# Patient Record
Sex: Male | Born: 1985 | Race: Black or African American | Hispanic: No | Marital: Single | Smoking: Current every day smoker
Health system: Southern US, Community
[De-identification: ages and names within clinical notes are randomized; demographics above are authoritative.]

## PROBLEM LIST (undated history)

## (undated) DIAGNOSIS — T1491XA Suicide attempt, initial encounter: Secondary | ICD-10-CM

## (undated) DIAGNOSIS — J302 Other seasonal allergic rhinitis: Secondary | ICD-10-CM

## (undated) DIAGNOSIS — M1990 Unspecified osteoarthritis, unspecified site: Secondary | ICD-10-CM

## (undated) DIAGNOSIS — F319 Bipolar disorder, unspecified: Secondary | ICD-10-CM

## (undated) DIAGNOSIS — Z765 Malingerer [conscious simulation]: Secondary | ICD-10-CM

## (undated) DIAGNOSIS — Z21 Asymptomatic human immunodeficiency virus [HIV] infection status: Secondary | ICD-10-CM

## (undated) DIAGNOSIS — F209 Schizophrenia, unspecified: Secondary | ICD-10-CM

## (undated) DIAGNOSIS — J45909 Unspecified asthma, uncomplicated: Secondary | ICD-10-CM

## (undated) DIAGNOSIS — F909 Attention-deficit hyperactivity disorder, unspecified type: Secondary | ICD-10-CM

## (undated) DIAGNOSIS — B20 Human immunodeficiency virus [HIV] disease: Secondary | ICD-10-CM

## (undated) DIAGNOSIS — J40 Bronchitis, not specified as acute or chronic: Secondary | ICD-10-CM

## (undated) DIAGNOSIS — F1910 Other psychoactive substance abuse, uncomplicated: Secondary | ICD-10-CM

## (undated) DIAGNOSIS — K219 Gastro-esophageal reflux disease without esophagitis: Secondary | ICD-10-CM

## (undated) HISTORY — PX: FRACTURE SURGERY: SHX138

## (undated) HISTORY — PX: KNEE SURGERY: SHX244

---
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
## 2005-02-07 ENCOUNTER — Emergency Department (HOSPITAL_COMMUNITY): Admission: EM | Admit: 2005-02-07 | Discharge: 2005-02-07 | Payer: Self-pay | Admitting: Emergency Medicine

## 2005-02-10 ENCOUNTER — Emergency Department (HOSPITAL_COMMUNITY): Admission: EM | Admit: 2005-02-10 | Discharge: 2005-02-10 | Payer: Self-pay | Admitting: Emergency Medicine

## 2005-05-21 ENCOUNTER — Emergency Department (HOSPITAL_COMMUNITY): Admission: EM | Admit: 2005-05-21 | Discharge: 2005-05-21 | Payer: Self-pay | Admitting: Emergency Medicine

## 2005-10-17 ENCOUNTER — Emergency Department (HOSPITAL_COMMUNITY): Admission: EM | Admit: 2005-10-17 | Discharge: 2005-10-17 | Payer: Self-pay | Admitting: Emergency Medicine

## 2006-05-10 ENCOUNTER — Emergency Department (HOSPITAL_COMMUNITY): Admission: EM | Admit: 2006-05-10 | Discharge: 2006-05-10 | Payer: Self-pay | Admitting: Emergency Medicine

## 2006-06-19 ENCOUNTER — Emergency Department (HOSPITAL_COMMUNITY): Admission: EM | Admit: 2006-06-19 | Discharge: 2006-06-19 | Payer: Self-pay | Admitting: Emergency Medicine

## 2006-06-20 ENCOUNTER — Emergency Department (HOSPITAL_COMMUNITY): Admission: EM | Admit: 2006-06-20 | Discharge: 2006-06-20 | Payer: Self-pay | Admitting: Emergency Medicine

## 2006-06-22 ENCOUNTER — Emergency Department (HOSPITAL_COMMUNITY): Admission: EM | Admit: 2006-06-22 | Discharge: 2006-06-22 | Payer: Self-pay | Admitting: Emergency Medicine

## 2006-07-12 ENCOUNTER — Emergency Department (HOSPITAL_COMMUNITY): Admission: EM | Admit: 2006-07-12 | Discharge: 2006-07-12 | Payer: Self-pay | Admitting: Diagnostic Radiology

## 2006-07-19 ENCOUNTER — Emergency Department (HOSPITAL_COMMUNITY): Admission: EM | Admit: 2006-07-19 | Discharge: 2006-07-20 | Payer: Self-pay | Admitting: Emergency Medicine

## 2006-07-23 ENCOUNTER — Emergency Department (HOSPITAL_COMMUNITY): Admission: EM | Admit: 2006-07-23 | Discharge: 2006-07-23 | Payer: Self-pay | Admitting: Emergency Medicine

## 2006-07-30 ENCOUNTER — Emergency Department (HOSPITAL_COMMUNITY): Admission: EM | Admit: 2006-07-30 | Discharge: 2006-07-31 | Payer: Self-pay | Admitting: Emergency Medicine

## 2006-09-04 ENCOUNTER — Emergency Department (HOSPITAL_COMMUNITY): Admission: EM | Admit: 2006-09-04 | Discharge: 2006-09-04 | Payer: Self-pay | Admitting: Emergency Medicine

## 2006-09-14 ENCOUNTER — Emergency Department (HOSPITAL_COMMUNITY): Admission: EM | Admit: 2006-09-14 | Discharge: 2006-09-15 | Payer: Self-pay | Admitting: Emergency Medicine

## 2006-11-05 ENCOUNTER — Emergency Department (HOSPITAL_COMMUNITY): Admission: EM | Admit: 2006-11-05 | Discharge: 2006-11-05 | Payer: Self-pay | Admitting: Emergency Medicine

## 2007-02-08 ENCOUNTER — Emergency Department (HOSPITAL_COMMUNITY): Admission: EM | Admit: 2007-02-08 | Discharge: 2007-02-08 | Payer: Self-pay | Admitting: Emergency Medicine

## 2007-03-05 ENCOUNTER — Emergency Department (HOSPITAL_COMMUNITY): Admission: EM | Admit: 2007-03-05 | Discharge: 2007-03-05 | Payer: Self-pay | Admitting: Emergency Medicine

## 2007-03-19 ENCOUNTER — Emergency Department (HOSPITAL_COMMUNITY): Admission: EM | Admit: 2007-03-19 | Discharge: 2007-03-19 | Payer: Self-pay | Admitting: Emergency Medicine

## 2007-06-17 ENCOUNTER — Emergency Department (HOSPITAL_COMMUNITY): Admission: EM | Admit: 2007-06-17 | Discharge: 2007-06-17 | Payer: Self-pay | Admitting: Emergency Medicine

## 2007-07-17 ENCOUNTER — Emergency Department (HOSPITAL_COMMUNITY): Admission: EM | Admit: 2007-07-17 | Discharge: 2007-07-17 | Payer: Self-pay | Admitting: Emergency Medicine

## 2007-08-11 ENCOUNTER — Emergency Department (HOSPITAL_COMMUNITY): Admission: EM | Admit: 2007-08-11 | Discharge: 2007-08-11 | Payer: Self-pay | Admitting: Emergency Medicine

## 2007-09-13 ENCOUNTER — Emergency Department (HOSPITAL_COMMUNITY): Admission: EM | Admit: 2007-09-13 | Discharge: 2007-09-13 | Payer: Self-pay | Admitting: Emergency Medicine

## 2007-09-16 ENCOUNTER — Emergency Department (HOSPITAL_COMMUNITY): Admission: EM | Admit: 2007-09-16 | Discharge: 2007-09-16 | Payer: Self-pay | Admitting: Emergency Medicine

## 2007-09-18 ENCOUNTER — Emergency Department (HOSPITAL_COMMUNITY): Admission: EM | Admit: 2007-09-18 | Discharge: 2007-09-18 | Payer: Self-pay | Admitting: Emergency Medicine

## 2007-10-07 ENCOUNTER — Emergency Department (HOSPITAL_COMMUNITY): Admission: EM | Admit: 2007-10-07 | Discharge: 2007-10-07 | Payer: Self-pay | Admitting: Emergency Medicine

## 2007-12-11 ENCOUNTER — Emergency Department (HOSPITAL_COMMUNITY): Admission: EM | Admit: 2007-12-11 | Discharge: 2007-12-12 | Payer: Self-pay | Admitting: Emergency Medicine

## 2007-12-15 ENCOUNTER — Emergency Department (HOSPITAL_COMMUNITY): Admission: EM | Admit: 2007-12-15 | Discharge: 2007-12-15 | Payer: Self-pay | Admitting: Emergency Medicine

## 2009-04-22 ENCOUNTER — Emergency Department (HOSPITAL_COMMUNITY): Admission: EM | Admit: 2009-04-22 | Discharge: 2009-04-22 | Payer: Self-pay | Admitting: Emergency Medicine

## 2009-04-28 ENCOUNTER — Emergency Department (HOSPITAL_COMMUNITY): Admission: EM | Admit: 2009-04-28 | Discharge: 2009-04-28 | Payer: Self-pay | Admitting: Family Medicine

## 2009-05-18 ENCOUNTER — Emergency Department (HOSPITAL_COMMUNITY): Admission: EM | Admit: 2009-05-18 | Discharge: 2009-05-18 | Payer: Self-pay | Admitting: Family Medicine

## 2009-06-15 ENCOUNTER — Emergency Department (HOSPITAL_COMMUNITY): Admission: EM | Admit: 2009-06-15 | Discharge: 2009-06-15 | Payer: Self-pay | Admitting: Family Medicine

## 2009-08-03 ENCOUNTER — Emergency Department (HOSPITAL_COMMUNITY): Admission: EM | Admit: 2009-08-03 | Discharge: 2009-08-03 | Payer: Self-pay | Admitting: Emergency Medicine

## 2009-10-01 ENCOUNTER — Emergency Department (HOSPITAL_COMMUNITY): Admission: EM | Admit: 2009-10-01 | Discharge: 2009-10-01 | Payer: Self-pay | Admitting: Emergency Medicine

## 2010-06-25 ENCOUNTER — Emergency Department (HOSPITAL_COMMUNITY)
Admission: EM | Admit: 2010-06-25 | Discharge: 2010-06-25 | Payer: Self-pay | Source: Home / Self Care | Admitting: Emergency Medicine

## 2010-09-13 ENCOUNTER — Emergency Department (HOSPITAL_COMMUNITY)
Admission: EM | Admit: 2010-09-13 | Discharge: 2010-09-13 | Disposition: A | Discharge status: Home / Self Care | Payer: Self-pay | Attending: Emergency Medicine | Admitting: Emergency Medicine

## 2010-09-13 DIAGNOSIS — L02219 Cutaneous abscess of trunk, unspecified: Secondary | ICD-10-CM | POA: Insufficient documentation

## 2010-09-16 ENCOUNTER — Emergency Department (HOSPITAL_COMMUNITY)
Admission: EM | Admit: 2010-09-16 | Discharge: 2010-09-16 | Disposition: A | Discharge status: Home / Self Care | Payer: Self-pay | Attending: Emergency Medicine | Admitting: Emergency Medicine

## 2010-09-16 DIAGNOSIS — L03319 Cellulitis of trunk, unspecified: Secondary | ICD-10-CM | POA: Insufficient documentation

## 2010-09-16 DIAGNOSIS — J45909 Unspecified asthma, uncomplicated: Secondary | ICD-10-CM | POA: Insufficient documentation

## 2010-09-16 DIAGNOSIS — Z09 Encounter for follow-up examination after completed treatment for conditions other than malignant neoplasm: Secondary | ICD-10-CM | POA: Insufficient documentation

## 2010-09-16 DIAGNOSIS — L02219 Cutaneous abscess of trunk, unspecified: Secondary | ICD-10-CM | POA: Insufficient documentation

## 2010-10-05 ENCOUNTER — Emergency Department (HOSPITAL_COMMUNITY)
Admission: EM | Admit: 2010-10-05 | Discharge: 2010-10-05 | Disposition: A | Discharge status: Home / Self Care | Payer: Self-pay | Attending: Emergency Medicine | Admitting: Emergency Medicine

## 2010-10-05 ENCOUNTER — Emergency Department (HOSPITAL_COMMUNITY): Payer: Self-pay

## 2010-10-05 DIAGNOSIS — M25569 Pain in unspecified knee: Secondary | ICD-10-CM | POA: Insufficient documentation

## 2010-10-05 DIAGNOSIS — M25519 Pain in unspecified shoulder: Secondary | ICD-10-CM | POA: Insufficient documentation

## 2010-10-05 DIAGNOSIS — I498 Other specified cardiac arrhythmias: Secondary | ICD-10-CM | POA: Insufficient documentation

## 2010-10-05 DIAGNOSIS — R55 Syncope and collapse: Secondary | ICD-10-CM | POA: Insufficient documentation

## 2010-11-10 NOTE — Op Note (Signed)
Fred Wilson, Fred Wilson                          ACCOUNT NO.:  [id number]   MEDICAL RECORD NO.:  [id number]                   PATIENT TYPE:  OIB   LOCATION:  2899                                 FACILITY:  [facility]   PHYSICIAN:  Mark C. Ophelia Charter, M.D.                 DATE OF BIRTH:  04-20-1986   DATE OF PROCEDURE:  09/10/2003  DATE OF DISCHARGE:  09/10/2003                                 OPERATIVE REPORT   PREOPERATIVE DIAGNOSIS:  Left distal radius fracture status post reduction  with loss of position.   POSTOPERATIVE DIAGNOSIS:  Left distal radius fracture status post reduction  with loss of position.   PROCEDURE:  Closed reduction under general anesthesia, percutaneous pinning.   SURGEON:  Mark C. Ophelia Charter, M.D.   ANESTHESIA:  GOT.   EBL:  Minimal.   TOURNIQUET:  None.   PROCEDURE:  After induction of general anesthesia with the patient asleep,  the wrist was checked under fluoroscopy with extension.  There was  instability of the fracture and angulation of 50 degrees dorsally of the  distal fragment.  With distraction and pushing, there was improvement in  position, but it still remained angulated, about 30 degrees.  It was checked  under fluoroscopy, and despite pushing, there was inadequate distraction to  allow the distal fragment to be completely pushed over.  A small stab was  made after prepping with DuraPrep, Stockinette application, and extremity  sheet.  Stockinette was cut, and a small stab was made at the level of the  fracture, and a Glorious Peach was slipped through the subcutaneous tissue adjacent  to the extensor tendons down to the periosteum and slipped in the periosteal  sleeve at the level of the fracture.  Checked under fluoro.  It was gently  teased into the fracture, and then a Key elevator, small size, was inserted  into the fracture for distraction and used as a lever to allow adequate  distraction and then reduction performed with this reducing the fracture.  With  the Glorious Peach held in the fracture site holding reduction, a .062 K wire  was drilled through the styloid across the physis, and then into the  opposite cortex of the radius.  It was anatomic on AP x-ray and was  displaced about 2-3 mm in less than 5 degrees of angulation on lateral  fluoroscopic view.  Flexion/extension was performed under fluoroscopy of the  wrist. The fracture was stable, and the small stab incision where the Key  elevator had been inserted was closed with a simple, single, 4-0 nylon  suture.  The pin was bent over, cut, and protector placed Xeroform.  A small  amount of skin released for area of pressure on the skin.  4 x 4's, web  roll, and a short-arm splint was applied.  The patient tolerated the  procedure well.  Was transferred to the recovery room in stable condition.  Mark C. Ophelia Charter, M.D.    MCY/MEDQ  D:  09/10/2003  T:  09/13/2003  Job:  [id number]

## 2010-11-10 NOTE — Op Note (Signed)
NAMECHAIM, GATLEY                ACCOUNT NO.:  [id number]   MEDICAL RECORD NO.:  [id number]          PATIENT TYPE:  AMB   LOCATION:  DSC                          FACILITY:  [facility]   PHYSICIAN:  Mark C. Ophelia Charter, M.D.    DATE OF BIRTH:  1985-08-19   DATE OF PROCEDURE:  12/29/2004  DATE OF DISCHARGE:                                 OPERATIVE REPORT   PREOPERATIVE DIAGNOSES:  Right knee pain with mechanical symptoms,  derangement.   POSTOPERATIVE DIAGNOSIS:  Right knee inflamed media plica.   PROCEDURE:  Diagnostic and operative arthroscopy, plica resection.   SURGEON:  Mark C. Ophelia Charter, M.D.   ANESTHESIA:  MEL plus Marcaine local.   INDICATIONS FOR PROCEDURE:  This 25 year old male has had persistent giving-  way, with an MRI scan demonstrating a small cyst along the PCL.  No definite  meniscal tear.  He has had pain medially in his knee with giving way, and an  inconsistent examination.   DESCRIPTION OF PROCEDURE:  After the induction of anesthesia and a proximal  thigh tourniquet in a proximal leg holder, standard prepping and draping, an  inflow was placed in the superolateral portal.  Medial and lateral  parapatellar tendon portals were used for scope and probe placement.  The  patellofemoral joint was inspected.  There was a medial shelf plica which  appeared normal in the suprapatellar pouch.  It extended half way across the  suprapatellar pouch.  The medial and lateral gutters were __________.  There  was some abradement along the medial aspect of the medial femoral condyle  adjacent to the articular surface line, and there was some thickening of the  plica in this area.  The medial meniscus was intact.  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  No changes in the trochlear groove.  The ACL  was inspected and was normal.  The synovium was debrided over the PCL.  There were some cysts seen on MRI preoperatively; however, the posterior  horn of the lateral and the medial  meniscus as well as the notch viewing was  normal.  The synovium was debrided over the ACL and PCL.  The lateral  compartment was inspected.  The meniscus was probed.  The popliteal hiatus  was normal.  The anterior and posterior horn were normal.  Cartilage showed  no degenerative changes.   Attention was turned to the medial plica which was resected with the 4.2  Cuda shaver.  Once the plica was resected with normal synovium behind, the  knee was taken out into full extension and flexed and care was taken to make  sure that there was no further abradement against the medial femoral  condyle, where the patient had been symptomatic.  After a thorough irrigation and aspiration, tincture of Benzoin and Steri-  Strips and Tegaderm, Marcaine infiltration, 4 x 4's, Webril, ABD and a six-  inch Ace wrap x2.   The patient was transferred to the recovery room in stable condition.       MCY/MEDQ  D:  12/29/2004  T:  12/29/2004  Job:  [id number] 

## 2011-03-16 LAB — I-STAT 8, (EC8 V) (CONVERTED LAB)
Acid-Base Excess: 1
Chloride: 106
HCT: 43
Hemoglobin: 14.6
Potassium: 3.8
Sodium: 139
TCO2: 26
pH, Ven: 7.417 — ABNORMAL HIGH

## 2011-03-16 LAB — POCT CARDIAC MARKERS
CKMB, poc: <1 — ABNORMAL LOW
Troponin i, poc: <0.05

## 2011-03-16 LAB — DIFFERENTIAL
Basophils Relative: 0
Eosinophils Absolute: 0.3
Eosinophils Relative: 3
Lymphs Abs: 2.3
Monocytes Relative: 5
Neutrophils Relative %: 70

## 2011-03-16 LAB — CBC
HCT: 38.9 — ABNORMAL LOW
MCHC: 34.5
MCV: 91.6
Platelets: 276

## 2011-03-16 LAB — POCT I-STAT CREATININE: Creatinine, Ser: 0.8

## 2011-03-16 LAB — D-DIMER, QUANTITATIVE: D-Dimer, Quant: 0.24

## 2011-03-19 LAB — RAPID URINE DRUG SCREEN, HOSP PERFORMED
Amphetamines: NOT DETECTED
Opiates: NOT DETECTED
Tetrahydrocannabinol: POSITIVE — AB

## 2011-03-19 LAB — DIFFERENTIAL
Basophils Absolute: 0
Eosinophils Relative: 0
Lymphocytes Relative: 12
Lymphs Abs: 1.5
Monocytes Absolute: 0.3
Neutro Abs: 10.6 — ABNORMAL HIGH

## 2011-03-19 LAB — URINALYSIS, ROUTINE W REFLEX MICROSCOPIC
Bilirubin Urine: NEGATIVE
Bilirubin Urine: NEGATIVE
Hgb urine dipstick: NEGATIVE
Hgb urine dipstick: NEGATIVE
Ketones, ur: NEGATIVE
Nitrite: NEGATIVE
Nitrite: NEGATIVE
Protein, ur: NEGATIVE
Protein, ur: NEGATIVE
Specific Gravity, Urine: 1.025
Urobilinogen, UA: 1
Urobilinogen, UA: 1

## 2011-03-19 LAB — CBC
HCT: 41.4
Hemoglobin: 14.3
RBC: 4.48
RDW: 14.1
WBC: 12.4 — ABNORMAL HIGH

## 2011-03-19 LAB — COMPREHENSIVE METABOLIC PANEL
AST: 27
Albumin: 3.9
Alkaline Phosphatase: 86
BUN: 8
CO2: 24
Chloride: 108
GFR calc non Af Amer: >60
Potassium: 3.9
Total Bilirubin: 0.8

## 2011-03-22 LAB — CBC
HCT: 38.6 — ABNORMAL LOW
Hemoglobin: 14.8
MCHC: 34.4
MCHC: 34.7
MCV: 91.1
Platelets: 288
RBC: 4.64
RDW: 13.4

## 2011-03-22 LAB — POCT I-STAT, CHEM 8
BUN: 8
Creatinine, Ser: 1
Potassium: 3.9
Sodium: 140

## 2011-03-22 LAB — URINALYSIS, ROUTINE W REFLEX MICROSCOPIC
Bilirubin Urine: NEGATIVE
Glucose, UA: NEGATIVE
Hgb urine dipstick: NEGATIVE
Ketones, ur: 15 — AB
Protein, ur: NEGATIVE

## 2011-03-22 LAB — DIFFERENTIAL
Basophils Absolute: 0
Basophils Relative: 1
Lymphocytes Relative: 20
Lymphocytes Relative: 24
Monocytes Absolute: 0.4
Monocytes Absolute: 0.5
Monocytes Relative: 5
Neutro Abs: 6
Neutro Abs: 6.1

## 2011-03-22 LAB — COMPREHENSIVE METABOLIC PANEL
Albumin: 3.8
BUN: 8
Calcium: 9.1
Chloride: 109
Creatinine, Ser: 0.81
Total Bilirubin: 1
Total Protein: 6.2

## 2011-04-06 LAB — URINALYSIS, ROUTINE W REFLEX MICROSCOPIC
Bilirubin Urine: NEGATIVE
Glucose, UA: NEGATIVE
Hgb urine dipstick: NEGATIVE
Ketones, ur: NEGATIVE
pH: 6.5

## 2011-04-06 LAB — POCT I-STAT CREATININE
Creatinine, Ser: 1
Operator id: 196461

## 2011-04-06 LAB — I-STAT 8, (EC8 V) (CONVERTED LAB)
BUN: 6
Glucose, Bld: 105 — ABNORMAL HIGH
Hemoglobin: 15.6
Potassium: 4.3
Sodium: 140

## 2011-04-06 LAB — RAPID URINE DRUG SCREEN, HOSP PERFORMED
Benzodiazepines: NOT DETECTED
Cocaine: NOT DETECTED

## 2011-04-06 LAB — CARDIAC PANEL(CRET KIN+CKTOT+MB+TROPI): Troponin I: 0.01

## 2011-04-21 ENCOUNTER — Emergency Department (HOSPITAL_COMMUNITY): Payer: Self-pay

## 2011-04-21 ENCOUNTER — Emergency Department (HOSPITAL_COMMUNITY)
Admission: EM | Admit: 2011-04-21 | Discharge: 2011-04-21 | Disposition: A | Discharge status: Home / Self Care | Payer: Self-pay | Attending: Emergency Medicine | Admitting: Emergency Medicine

## 2011-04-21 DIAGNOSIS — M7989 Other specified soft tissue disorders: Secondary | ICD-10-CM | POA: Insufficient documentation

## 2011-04-21 DIAGNOSIS — M79609 Pain in unspecified limb: Secondary | ICD-10-CM | POA: Insufficient documentation

## 2011-04-21 DIAGNOSIS — J45909 Unspecified asthma, uncomplicated: Secondary | ICD-10-CM | POA: Insufficient documentation

## 2011-06-05 ENCOUNTER — Emergency Department (HOSPITAL_COMMUNITY)
Admission: EM | Admit: 2011-06-05 | Discharge: 2011-06-06 | Disposition: A | Discharge status: Home / Self Care | Payer: Self-pay | Attending: Emergency Medicine | Admitting: Emergency Medicine

## 2011-06-05 DIAGNOSIS — R059 Cough, unspecified: Secondary | ICD-10-CM | POA: Insufficient documentation

## 2011-06-05 DIAGNOSIS — R072 Precordial pain: Secondary | ICD-10-CM | POA: Insufficient documentation

## 2011-06-05 DIAGNOSIS — R05 Cough: Secondary | ICD-10-CM | POA: Insufficient documentation

## 2011-06-05 DIAGNOSIS — R112 Nausea with vomiting, unspecified: Secondary | ICD-10-CM | POA: Insufficient documentation

## 2011-06-05 DIAGNOSIS — K219 Gastro-esophageal reflux disease without esophagitis: Secondary | ICD-10-CM | POA: Insufficient documentation

## 2011-06-05 HISTORY — DX: Gastro-esophageal reflux disease without esophagitis: K21.9

## 2011-06-05 HISTORY — DX: Unspecified osteoarthritis, unspecified site: M19.90

## 2011-06-06 ENCOUNTER — Emergency Department (HOSPITAL_COMMUNITY): Payer: Self-pay

## 2011-06-06 ENCOUNTER — Encounter: Payer: Self-pay | Admitting: Emergency Medicine

## 2011-06-06 LAB — POCT I-STAT, CHEM 8
Creatinine, Ser: 0.9 mg/dL (ref 0.50–1.35)
Hemoglobin: 15.3 g/dL (ref 13.0–17.0)
Sodium: 141 mEq/L (ref 135–145)
TCO2: 25 mmol/L (ref 0–100)

## 2011-06-06 MED ORDER — GI COCKTAIL ~~LOC~~
30.0000 mL | Freq: Once | ORAL | Status: AC
  Administered 2011-06-06: 30 mL via ORAL
  Filled 2011-06-06: qty 30

## 2011-06-06 MED ORDER — FAMOTIDINE 20 MG PO TABS: 20.0000 mg | ORAL_TABLET | Freq: Two times a day (BID) | ORAL | Status: DC

## 2011-06-06 NOTE — ED Notes (Signed)
Pt. Discharged to home, pt. Alert and oriented, ambulatory, gait steady, NAD noted, respirations even and regular

## 2011-06-06 NOTE — ED Provider Notes (Addendum)
History     CSN: [id number] Arrival date & time: 06/05/2011 11:56 PM   First MD Initiated Contact with Patient 06/06/11 0108      Chief Complaint  Patient presents with  . Cough    (Consider location/radiation/quality/duration/timing/severity/associated sxs/prior treatment) HPI Comments: 25 year old male with a history of GERD and arthritis who presents with a complaint of midsternal chest pain which is sharp, nothing makes better, worse with eating, associated with nausea and vomiting x3 episodes yesterday. He noted that taking a bite of food may the nausea and vomiting worse yesterday. He has had no diarrhea, no swelling, no fevers and minimal cough. No sore throat nasal congestion or changes in vision hearing or ear pain. Symptoms are currently mild as they have improved since his arrival here. He denies a history of severe reflux disease and is unsure what it feels like. He states that he is supposed to be taking medication for this but does not Minimal cough, no trauma, no surgery, no estrogen-containing supplements, no immobilization, no swelling of the lower extremities  The history is provided by the patient and medical records.    Past Medical History  Diagnosis Date  . Arthritis   . GERD (gastroesophageal reflux disease)     Past Surgical History  Procedure Date  . Fracture surgery     No family history on file.  History  Substance Use Topics  . Smoking status: Current Everyday Smoker    Types: Cigarettes  . Smokeless tobacco: Not on file  . Alcohol Use: No      Review of Systems  All other systems reviewed and are negative.    Allergies  Aspirin; Ibuprofen; Morphine and related; and Tramadol  Home Medications   Current Outpatient Rx  Name Route Sig Dispense Refill  . FAMOTIDINE 20 MG PO TABS Oral Take 1 tablet (20 mg total) by mouth 2 (two) times daily. 30 tablet 0    BP 130/81  Pulse 57  Temp(Src) 97.9 F (36.6 C) (Oral)  Resp 20  SpO2  100%  Physical Exam  Nursing note and vitals reviewed. Constitutional: He appears well-developed and well-nourished. No distress.  HENT:  Head: Normocephalic and atraumatic.  Mouth/Throat: Oropharynx is clear and moist. No oropharyngeal exudate.  Eyes: Conjunctivae and EOM are normal. Pupils are equal, round, and reactive to light. Right eye exhibits no discharge. Left eye exhibits no discharge. No scleral icterus.  Neck: Normal range of motion. Neck supple. No JVD present. No thyromegaly present.  Cardiovascular: Normal rate, regular rhythm, normal heart sounds and intact distal pulses.  Exam reveals no gallop and no friction rub.   No murmur heard. Pulmonary/Chest: Effort normal and breath sounds normal. No respiratory distress. He has no wheezes. He has no rales. He exhibits tenderness ( n mild reproducible chest tenderness).  Abdominal: Soft. Bowel sounds are normal. He exhibits no distension and no mass. There is no tenderness.  Musculoskeletal: Normal range of motion. He exhibits no edema and no tenderness.  Lymphadenopathy:    He has no cervical adenopathy.  Neurological: He is alert. Coordination normal.  Skin: Skin is warm and dry. No rash noted. No erythema.  Psychiatric: He has a normal mood and affect. His behavior is normal.    ED Course  Procedures (including critical care time)  Labs Reviewed  POCT I-STAT, CHEM 8 - Abnormal; Notable for the following:    Glucose, Bld 104 (*)    All other components within normal limits  I-STAT, CHEM 8  Dg Chest 2 View  06/06/2011  *RADIOLOGY REPORT*  Clinical Data: Chest pain.  Cough.  CHEST - 2 VIEW  Comparison: 04/22/2009  Findings: The heart size and pulmonary vascularity are normal. The lungs appear clear and expanded without focal air space disease or consolidation. No blunting of the costophrenic angles.  No significant change since previous study.  IMPRESSION: No evidence of active pulmonary disease.  Original Report  Authenticated By: Marlon Pel, M.D.     1. GERD (gastroesophageal reflux disease)       MDM  Patient is very well-appearing, no signs of upper respiratory illness, minimal coughing but fluctuating chest pain related to eating throughout the day. Has no history of cardiac disease is currently untreated for GERD. Will obtain an x-ray and EKG otherwise treat for GERD type symptoms. Patient appears well and only has minimal symptoms at this time  ED ECG REPORT   Date: 06/06/2011   Rate: 62  Rhythm: normal sinus rhythm  QRS Axis: normal  Intervals: normal  ST/T Wave abnormalities: normal  Conduction Disutrbances:none  Narrative Interpretation:   Old EKG Reviewed: unchanged and 10/05/10     X-ray negative, EKG nonischemic, patient to be treated more aggressively for gastroesophageal reflux disease.  Vida Roller, MD 06/06/11 3086  Vida Roller, MD 06/06/11 [phone]

## 2011-06-06 NOTE — ED Notes (Signed)
Pt c/o productive cough x's 2 days.  Also c/o nausea and vomiting no diarrhea.

## 2011-06-27 ENCOUNTER — Encounter (HOSPITAL_COMMUNITY): Payer: Self-pay | Admitting: Emergency Medicine

## 2011-06-27 ENCOUNTER — Emergency Department (HOSPITAL_COMMUNITY)
Admission: EM | Admit: 2011-06-27 | Discharge: 2011-06-27 | Disposition: A | Discharge status: Home / Self Care | Payer: Self-pay | Attending: Emergency Medicine | Admitting: Emergency Medicine

## 2011-06-27 DIAGNOSIS — R059 Cough, unspecified: Secondary | ICD-10-CM | POA: Insufficient documentation

## 2011-06-27 DIAGNOSIS — R05 Cough: Secondary | ICD-10-CM | POA: Insufficient documentation

## 2011-06-27 DIAGNOSIS — B349 Viral infection, unspecified: Secondary | ICD-10-CM

## 2011-06-27 DIAGNOSIS — K219 Gastro-esophageal reflux disease without esophagitis: Secondary | ICD-10-CM | POA: Insufficient documentation

## 2011-06-27 DIAGNOSIS — B9789 Other viral agents as the cause of diseases classified elsewhere: Secondary | ICD-10-CM | POA: Insufficient documentation

## 2011-06-27 DIAGNOSIS — R1110 Vomiting, unspecified: Secondary | ICD-10-CM | POA: Insufficient documentation

## 2011-06-27 DIAGNOSIS — J3489 Other specified disorders of nose and nasal sinuses: Secondary | ICD-10-CM | POA: Insufficient documentation

## 2011-06-27 DIAGNOSIS — IMO0001 Reserved for inherently not codable concepts without codable children: Secondary | ICD-10-CM | POA: Insufficient documentation

## 2011-06-27 DIAGNOSIS — M129 Arthropathy, unspecified: Secondary | ICD-10-CM | POA: Insufficient documentation

## 2011-06-27 DIAGNOSIS — F17200 Nicotine dependence, unspecified, uncomplicated: Secondary | ICD-10-CM | POA: Insufficient documentation

## 2011-06-27 HISTORY — DX: Bronchitis, not specified as acute or chronic: J40

## 2011-06-27 HISTORY — DX: Other seasonal allergic rhinitis: J30.2

## 2011-06-27 LAB — POCT I-STAT, CHEM 8
Calcium, Ion: 1.19 mmol/L (ref 1.12–1.32)
Glucose, Bld: 105 mg/dL — ABNORMAL HIGH (ref 70–99)
HCT: 42 % (ref 39.0–52.0)
Hemoglobin: 14.3 g/dL (ref 13.0–17.0)
Potassium: 4.1 mEq/L (ref 3.5–5.1)

## 2011-06-27 MED ORDER — ONDANSETRON HCL 4 MG/2ML IJ SOLN
4.0000 mg | Freq: Once | INTRAMUSCULAR | Status: AC
  Administered 2011-06-27: 4 mg via INTRAVENOUS
  Filled 2011-06-27: qty 2

## 2011-06-27 MED ORDER — ONDANSETRON 4 MG PO TBDP: 4.0000 mg | ORAL_TABLET | Freq: Three times a day (TID) | ORAL | Status: AC | PRN

## 2011-06-27 MED ORDER — SODIUM CHLORIDE 0.9 % IV BOLUS (SEPSIS)
1000.0000 mL | Freq: Once | INTRAVENOUS | Status: AC
  Administered 2011-06-27: 1000 mL via INTRAVENOUS

## 2011-06-27 NOTE — ED Provider Notes (Signed)
History     CSN: [id number]  Arrival date & time 06/27/11  4098   First MD Initiated Contact with Patient 06/27/11 0719      Chief Complaint  Patient presents with  . Emesis    (Consider location/radiation/quality/duration/timing/severity/associated sxs/prior treatment) HPI  25yoM ho asthma pw multiple the patient complains of 3 days of body aches, vomiting. Multiple episodes of nonbilious nonbloody emesis per day. He states that he is unable to tolerate by mouth. He denies abdominal pain associated with this. He denies fevers which was reported in the triage note. He does have some chills. He has a minimally productive dry cough, nasal congestion, rhinorrhea. His young daughter had similar symptoms 3 days ago but he states vomiting after approximately 2 days. He is not having any diarrhea. No flu shot. Denies SOB, chest pain.    ED Notes, ED Provider Notes from 06/27/11 0000 to 06/27/11 06:52:03       Nada Libman, RN 06/27/2011 06:50      PT. REPORTS FEVER , CHILLS , BODY ACHES AND VOMITTING FOR 3 DAYS WITH DRY COUGH.     Past Medical History  Diagnosis Date  . Arthritis   . GERD (gastroesophageal reflux disease)   . Bronchitis   . Seasonal allergies     Past Surgical History  Procedure Date  . Fracture surgery   . Knee surgery     No family history on file.  History  Substance Use Topics  . Smoking status: Current Everyday Smoker    Types: Cigarettes  . Smokeless tobacco: Not on file  . Alcohol Use: No    Review of Systems  All other systems reviewed and are negative.   except as noted HPI   Allergies  Aspirin; Ibuprofen; Morphine and related; and Tramadol  Home Medications   Current Outpatient Rx  Name Route Sig Dispense Refill  . FAMOTIDINE 20 MG PO TABS Oral Take 1 tablet (20 mg total) by mouth 2 (two) times daily. 30 tablet 0  . ALBUTEROL SULFATE HFA 108 (90 BASE) MCG/ACT IN AERS Inhalation Inhale 2 puffs into the lungs every 6 (six) hours  as needed. Wheezing or shortness of breath     . ONDANSETRON 4 MG PO TBDP Oral Take 1 tablet (4 mg total) by mouth every 8 (eight) hours as needed for nausea. 20 tablet 0    BP 134/88  Pulse 68  Temp(Src) 98.4 F (36.9 C) (Oral)  Resp 16  SpO2 100%  Physical Exam  Nursing note and vitals reviewed. Constitutional: He is oriented to person, place, and time. He appears well-developed and well-nourished. No distress.  HENT:  Head: Atraumatic.  Mouth/Throat: Oropharynx is clear and moist.  Eyes: Conjunctivae are normal. Pupils are equal, round, and reactive to light.  Neck: Neck supple.  Cardiovascular: Normal rate, regular rhythm, normal heart sounds and intact distal pulses.  Exam reveals no gallop and no friction rub.   No murmur heard. Pulmonary/Chest: Effort normal. No respiratory distress. He has no wheezes. He has no rales.  Abdominal: Soft. Bowel sounds are normal. There is no tenderness. There is no rebound and no guarding.  Musculoskeletal: Normal range of motion. He exhibits no edema and no tenderness.  Neurological: He is alert and oriented to person, place, and time.  Skin: Skin is warm and dry.  Psychiatric: He has a normal mood and affect.    ED Course  Procedures (including critical care time)  Labs Reviewed  POCT I-STAT, CHEM 8 -  Abnormal; Notable for the following:    BUN 4 (*)    Glucose, Bld 105 (*)    All other components within normal limits  LAB REPORT - SCANNED   No results found.   1. Vomiting   2. Viral syndrome     MDM  Likely viral syndrome. Appears well here. No signs are stable. He is tolerating oral intake of fluids without vomiting after Zofran. Supportive care. Primary care f/u . Precautions for return        Forbes Cellar, MD 06/28/11 2351

## 2011-06-27 NOTE — ED Notes (Signed)
Patient has been provided with snacks and beverage.  Has had no issues.

## 2011-06-27 NOTE — ED Notes (Signed)
PT. REPORTS FEVER , CHILLS , BODY ACHES AND VOMITTING FOR 3 DAYS WITH DRY COUGH.

## 2011-07-09 ENCOUNTER — Encounter (HOSPITAL_COMMUNITY): Payer: Self-pay | Admitting: *Deleted

## 2011-07-09 ENCOUNTER — Emergency Department (HOSPITAL_COMMUNITY)
Admission: EM | Admit: 2011-07-09 | Discharge: 2011-07-09 | Disposition: A | Discharge status: Home / Self Care | Payer: Self-pay | Attending: Emergency Medicine | Admitting: Emergency Medicine

## 2011-07-09 ENCOUNTER — Emergency Department (HOSPITAL_COMMUNITY): Payer: Self-pay

## 2011-07-09 DIAGNOSIS — M79609 Pain in unspecified limb: Secondary | ICD-10-CM | POA: Insufficient documentation

## 2011-07-09 DIAGNOSIS — M129 Arthropathy, unspecified: Secondary | ICD-10-CM | POA: Insufficient documentation

## 2011-07-09 DIAGNOSIS — Z79899 Other long term (current) drug therapy: Secondary | ICD-10-CM | POA: Insufficient documentation

## 2011-07-09 DIAGNOSIS — K219 Gastro-esophageal reflux disease without esophagitis: Secondary | ICD-10-CM | POA: Insufficient documentation

## 2011-07-09 DIAGNOSIS — S6980XA Other specified injuries of unspecified wrist, hand and finger(s), initial encounter: Secondary | ICD-10-CM | POA: Insufficient documentation

## 2011-07-09 DIAGNOSIS — S6990XA Unspecified injury of unspecified wrist, hand and finger(s), initial encounter: Secondary | ICD-10-CM | POA: Insufficient documentation

## 2011-07-09 DIAGNOSIS — M25449 Effusion, unspecified hand: Secondary | ICD-10-CM | POA: Insufficient documentation

## 2011-07-09 DIAGNOSIS — S62609A Fracture of unspecified phalanx of unspecified finger, initial encounter for closed fracture: Secondary | ICD-10-CM

## 2011-07-09 DIAGNOSIS — Y9383 Activity, rough housing and horseplay: Secondary | ICD-10-CM | POA: Insufficient documentation

## 2011-07-09 DIAGNOSIS — IMO0002 Reserved for concepts with insufficient information to code with codable children: Secondary | ICD-10-CM | POA: Insufficient documentation

## 2011-07-09 MED ORDER — HYDROCODONE-ACETAMINOPHEN 5-325 MG PO TABS
1.0000 | ORAL_TABLET | Freq: Once | ORAL | Status: AC
  Administered 2011-07-09: 1 via ORAL
  Filled 2011-07-09: qty 1

## 2011-07-09 MED ORDER — HYDROCODONE-ACETAMINOPHEN 5-325 MG PO TABS: ORAL_TABLET | ORAL | Status: DC

## 2011-07-09 NOTE — Progress Notes (Signed)
Orthopedic Tech Progress Note Patient Details:  Fred Wilson [birth year] [id number]  Type of Splint: Finger Splint Location: left hand Splint Interventions: Application    Nikki Dom 07/09/2011, 7:14 PM

## 2011-07-09 NOTE — Discharge Instructions (Signed)
 Be sure to call and arrange follow up with hand surgery clinic to ensure that your finger is healing properly.    Narcotic and benzodiazepine use may cause drowsiness, slowed breathing or dependence.  Please use with caution and do not drive, operate machinery or watch young children alone while taking them.  Taking combinations of these medications or drinking alcohol will potentiate these effects.

## 2011-07-09 NOTE — ED Notes (Signed)
To ed for eval of right small finger pain and swelling since Friday when pt was wrestling.

## 2011-07-09 NOTE — ED Provider Notes (Signed)
History     CSN: [id number]  Arrival date & time 07/09/11  1645   First MD Initiated Contact with Patient 07/09/11 1754      Chief Complaint  Patient presents with  . Finger Injury    (Consider location/radiation/quality/duration/timing/severity/associated sxs/prior treatment) HPI Comments: Patient is right-handed and reports while play wrestling with his sibling, he had his left pinky finger either stand or bend backwards. Since then it has continued to be swollen with pain with any kind of movement. He denies skin discoloration, discharge. He has not had fevers or chills. Patient denies any other injuries. He has not taken any medication or put any ice on it  The history is provided by the patient.    Past Medical History  Diagnosis Date  . Arthritis   . GERD (gastroesophageal reflux disease)   . Bronchitis   . Seasonal allergies     Past Surgical History  Procedure Date  . Fracture surgery   . Knee surgery     History reviewed. No pertinent family history.  History  Substance Use Topics  . Smoking status: Current Everyday Smoker    Types: Cigarettes  . Smokeless tobacco: Not on file  . Alcohol Use: No      Review of Systems  Constitutional: Negative.   Musculoskeletal: Positive for joint swelling and arthralgias.  Skin: Negative for color change, rash and wound.  Neurological: Negative for weakness and numbness.    Allergies  Aspirin; Ibuprofen; Morphine and related; and Tramadol  Home Medications   Current Outpatient Rx  Name Route Sig Dispense Refill  . ACETAMINOPHEN 500 MG PO TABS Oral Take 1,000 mg by mouth every 4 (four) hours as needed. For pain    . FAMOTIDINE 20 MG PO TABS Oral Take 1 tablet (20 mg total) by mouth 2 (two) times daily. 30 tablet 0  . HYDROCODONE-ACETAMINOPHEN 5-325 MG PO TABS  1-2 tablets po q 6 hours prn moderate to severe pain 20 tablet 0    BP 128/72  Pulse 77  Temp(Src) 98.4 F (36.9 C) (Oral)  Resp 16  SpO2  100%  Physical Exam  Nursing note and vitals reviewed. Constitutional: He appears well-developed and well-nourished.  Cardiovascular: Intact distal pulses.   Musculoskeletal:       Hands: Skin: Skin is warm. No rash noted. No pallor.    ED Course  Procedures (including critical care time)  Labs Reviewed - No data to display Dg Finger Little Left  07/09/2011  *RADIOLOGY REPORT*  Clinical Data: Injured left fifth digit.  LEFT LITTLE FINGER 2+V  Comparison: None  Findings: There is a small volar plate avulsion fracture at the PIP joint.  The joint spaces are maintained.  No other fractures are seen.  IMPRESSION: Volar plate avulsion fracture at the PIP joint.  Original Report Authenticated By: P. Loralie Champagne, M.D.   I reviewed above film myself.  1. Finger fracture, left       MDM    Patient with a volar plate fracture of his proximal IP joints. Will place the patient in a finger splint and referred to hand clinic for followup.        Gavin Pound. Oletta Lamas, MD 07/09/11 1835

## 2011-07-14 ENCOUNTER — Emergency Department (HOSPITAL_COMMUNITY)
Admission: EM | Admit: 2011-07-14 | Discharge: 2011-07-14 | Disposition: A | Discharge status: Home / Self Care | Payer: Self-pay | Attending: Emergency Medicine | Admitting: Emergency Medicine

## 2011-07-14 DIAGNOSIS — S62609A Fracture of unspecified phalanx of unspecified finger, initial encounter for closed fracture: Secondary | ICD-10-CM | POA: Insufficient documentation

## 2011-07-14 DIAGNOSIS — M79609 Pain in unspecified limb: Secondary | ICD-10-CM | POA: Insufficient documentation

## 2011-07-14 DIAGNOSIS — X58XXXA Exposure to other specified factors, initial encounter: Secondary | ICD-10-CM | POA: Insufficient documentation

## 2011-07-14 DIAGNOSIS — K219 Gastro-esophageal reflux disease without esophagitis: Secondary | ICD-10-CM | POA: Insufficient documentation

## 2011-07-14 DIAGNOSIS — R609 Edema, unspecified: Secondary | ICD-10-CM | POA: Insufficient documentation

## 2011-07-14 DIAGNOSIS — M25549 Pain in joints of unspecified hand: Secondary | ICD-10-CM | POA: Insufficient documentation

## 2011-07-14 NOTE — ED Provider Notes (Signed)
Medical screening examination/treatment/procedure(s) were performed by non-physician practitioner and as supervising physician I was immediately available for consultation/collaboration.   Vernell Back L Ahna Konkle, MD 07/14/11 0622 

## 2011-07-14 NOTE — ED Notes (Signed)
Pt states splint fell off in shower and called EMS

## 2011-07-14 NOTE — ED Provider Notes (Signed)
History     CSN: [id number]  Arrival date & time 07/14/11  0433   First MD Initiated Contact with Patient 07/14/11 0454      Chief Complaint  Patient presents with  . Hand Pain    (Consider location/radiation/quality/duration/timing/severity/associated sxs/prior treatment) HPI Comments: Patient here with requesting replacement of left 5th finger splint - states that he got the last one wet while in the shower - called EMS to bring him here - states old one (placed 2 days ago) was uncomfortable anyway - has not followed up with hand  Patient is a 26 y.o. male presenting with hand pain. The history is provided by the patient. No language interpreter was used.  Hand Pain This is a recurrent problem. The current episode started yesterday. The problem occurs constantly. The problem has been unchanged. Associated symptoms include arthralgias. The symptoms are aggravated by nothing. He has tried nothing for the symptoms. The treatment provided no relief.    Past Medical History  Diagnosis Date  . Arthritis   . GERD (gastroesophageal reflux disease)   . Bronchitis   . Seasonal allergies     Past Surgical History  Procedure Date  . Fracture surgery   . Knee surgery     No family history on file.  History  Substance Use Topics  . Smoking status: Current Everyday Smoker    Types: Cigarettes  . Smokeless tobacco: Not on file  . Alcohol Use: No      Review of Systems  Musculoskeletal: Positive for arthralgias.  All other systems reviewed and are negative.    Allergies  Aspirin; Ibuprofen; Morphine and related; and Tramadol  Home Medications   Current Outpatient Rx  Name Route Sig Dispense Refill  . ACETAMINOPHEN 500 MG PO TABS Oral Take 1,000 mg by mouth every 4 (four) hours as needed. For pain    . FAMOTIDINE 20 MG PO TABS Oral Take 1 tablet (20 mg total) by mouth 2 (two) times daily. 30 tablet 0  . HYDROCODONE-ACETAMINOPHEN 5-325 MG PO TABS  1-2 tablets po q 6  hours prn moderate to severe pain 20 tablet 0    BP 133/63  Pulse 65  Temp 98.3 F (36.8 C)  Resp 18  Ht 5\' 9"  (1.753 m)  Wt 160 lb (72.576 kg)  BMI 23.63 kg/m2  SpO2 100%  Physical Exam  Nursing note and vitals reviewed. Constitutional: He is oriented to person, place, and time. He appears well-developed and well-nourished. No distress.  HENT:  Head: Normocephalic and atraumatic.  Right Ear: External ear normal.  Left Ear: External ear normal.  Nose: Nose normal.  Mouth/Throat: Oropharynx is clear and moist. No oropharyngeal exudate.  Eyes: Conjunctivae are normal. Pupils are equal, round, and reactive to light. No scleral icterus.  Neck: Normal range of motion. Neck supple.  Cardiovascular: Normal rate, regular rhythm and normal heart sounds.  Exam reveals no gallop and no friction rub.   No murmur heard. Pulmonary/Chest: Effort normal and breath sounds normal. No respiratory distress. He exhibits no tenderness.  Abdominal: Soft. Bowel sounds are normal. There is no tenderness.  Musculoskeletal: Normal range of motion. He exhibits edema and tenderness.       Pain to palpation of left 5th finger at PIP joint  Lymphadenopathy:    He has no cervical adenopathy.  Neurological: He is alert and oriented to person, place, and time. No cranial nerve deficit.  Skin: Skin is warm and dry.  Psychiatric: He has a normal mood  and affect. His behavior is normal. Judgment and thought content normal.    ED Course  Procedures (including critical care time)  Labs Reviewed - No data to display No results found.   1. Finger fracture       MDM  Finger splint replaced by me - patient tolerated procedure well - encouraged to follow up with hand for the fracture.        Izola Price White, [place] 07/14/11 [phone]

## 2011-08-09 ENCOUNTER — Emergency Department (HOSPITAL_COMMUNITY)
Admission: EM | Admit: 2011-08-09 | Discharge: 2011-08-09 | Disposition: A | Discharge status: Home / Self Care | Payer: Self-pay | Attending: Emergency Medicine | Admitting: Emergency Medicine

## 2011-08-09 ENCOUNTER — Encounter (HOSPITAL_COMMUNITY): Payer: Self-pay | Admitting: *Deleted

## 2011-08-09 ENCOUNTER — Emergency Department (HOSPITAL_COMMUNITY): Payer: Self-pay

## 2011-08-09 ENCOUNTER — Other Ambulatory Visit: Payer: Self-pay

## 2011-08-09 DIAGNOSIS — R05 Cough: Secondary | ICD-10-CM | POA: Insufficient documentation

## 2011-08-09 DIAGNOSIS — R079 Chest pain, unspecified: Secondary | ICD-10-CM | POA: Insufficient documentation

## 2011-08-09 DIAGNOSIS — K219 Gastro-esophageal reflux disease without esophagitis: Secondary | ICD-10-CM | POA: Insufficient documentation

## 2011-08-09 DIAGNOSIS — R059 Cough, unspecified: Secondary | ICD-10-CM | POA: Insufficient documentation

## 2011-08-09 DIAGNOSIS — R112 Nausea with vomiting, unspecified: Secondary | ICD-10-CM | POA: Insufficient documentation

## 2011-08-09 DIAGNOSIS — G43909 Migraine, unspecified, not intractable, without status migrainosus: Secondary | ICD-10-CM | POA: Insufficient documentation

## 2011-08-09 DIAGNOSIS — M940 Chondrocostal junction syndrome [Tietze]: Secondary | ICD-10-CM | POA: Insufficient documentation

## 2011-08-09 DIAGNOSIS — R42 Dizziness and giddiness: Secondary | ICD-10-CM | POA: Insufficient documentation

## 2011-08-09 DIAGNOSIS — J069 Acute upper respiratory infection, unspecified: Secondary | ICD-10-CM | POA: Insufficient documentation

## 2011-08-09 LAB — CBC
HCT: 42.6 % (ref 39.0–52.0)
Hemoglobin: 14.9 g/dL (ref 13.0–17.0)
MCH: 31.2 pg (ref 26.0–34.0)
MCHC: 35 g/dL (ref 30.0–36.0)
MCV: 89.1 fL (ref 78.0–100.0)

## 2011-08-09 LAB — BASIC METABOLIC PANEL
CO2: 26 mEq/L (ref 19–32)
Chloride: 105 mEq/L (ref 96–112)
Glucose, Bld: 95 mg/dL (ref 70–99)
Potassium: 3.7 mEq/L (ref 3.5–5.1)
Sodium: 139 mEq/L (ref 135–145)

## 2011-08-09 LAB — DIFFERENTIAL
Basophils Relative: 0 % (ref 0–1)
Eosinophils Absolute: 0.5 10*3/uL (ref 0.0–0.7)
Monocytes Absolute: 0.4 10*3/uL (ref 0.1–1.0)
Monocytes Relative: 5 % (ref 3–12)

## 2011-08-09 LAB — CARDIAC PANEL(CRET KIN+CKTOT+MB+TROPI)
Relative Index: 0.8 (ref 0.0–2.5)
Total CK: 318 U/L — ABNORMAL HIGH (ref 7–232)

## 2011-08-09 MED ORDER — PROMETHAZINE HCL 25 MG PO TABS: 25.0000 mg | ORAL_TABLET | Freq: Four times a day (QID) | ORAL | Status: DC | PRN

## 2011-08-09 MED ORDER — PROMETHAZINE HCL 25 MG PO TABS
25.0000 mg | ORAL_TABLET | Freq: Once | ORAL | Status: AC
  Administered 2011-08-09: 25 mg via ORAL
  Filled 2011-08-09: qty 1

## 2011-08-09 MED ORDER — SODIUM CHLORIDE 0.9 % IV BOLUS (SEPSIS)
1000.0000 mL | Freq: Once | INTRAVENOUS | Status: AC
  Administered 2011-08-09: 1000 mL via INTRAVENOUS

## 2011-08-09 MED ORDER — ACETAMINOPHEN 325 MG PO TABS
650.0000 mg | ORAL_TABLET | Freq: Once | ORAL | Status: AC
  Administered 2011-08-09: 650 mg via ORAL
  Filled 2011-08-09: qty 2

## 2011-08-09 MED ORDER — BENZONATATE 100 MG PO CAPS: 100.0000 mg | ORAL_CAPSULE | Freq: Three times a day (TID) | ORAL | Status: AC | PRN

## 2011-08-09 MED ORDER — SUMATRIPTAN SUCCINATE 50 MG PO TABS: 50.0000 mg | ORAL_TABLET | ORAL | Status: DC | PRN

## 2011-08-09 NOTE — ED Notes (Signed)
EDP at bedside  

## 2011-08-09 NOTE — ED Provider Notes (Signed)
History     CSN: [id number]  Arrival date & time 08/09/11  4098   First MD Initiated Contact with Patient 08/09/11 1019      Chief Complaint  Patient presents with  . Chest Pain  . Cough    (Consider location/radiation/quality/duration/timing/severity/associated sxs/prior treatment) Patient is a 26 y.o. male presenting with chest pain and cough. The history is provided by the patient.  Chest Pain The chest pain began 1 - 2 weeks ago. The pain is associated with breathing and coughing. The severity of the pain is moderate. The quality of the pain is described as sharp. The pain does not radiate. Primary symptoms include cough, nausea, vomiting and dizziness. Pertinent negatives for primary symptoms include no fever, no shortness of breath, no wheezing, no palpitations and no abdominal pain.  The cough began more than 1 week ago. The cough is dry and non-productive.  He describes the dizziness as a sensation of spinning. Dizziness also occurs with nausea and vomiting. Dizziness does not occur with weakness.  Pertinent negatives for associated symptoms include no numbness and no weakness. Risk factors include smoking/tobacco exposure.  Pertinent negatives for past medical history include no seizures.    Cough Associated symptoms include chest pain and headaches. Pertinent negatives include no chills, no ear pain, no sore throat, no shortness of breath and no wheezing.   Pt presents with multiple complaints >1 week. Primary complaint is chest wall pain worse when coughing or deep breathing. Pain is located over sternum. He has leg swelling or pain. Pt states he has URI symptoms for several weeks. + cough. No fevers or chills, neck stiffness. Pt c/o normal migraine HA with nausea.   Past Medical History  Diagnosis Date  . Arthritis   . GERD (gastroesophageal reflux disease)   . Bronchitis   . Seasonal allergies     Past Surgical History  Procedure Date  . Fracture surgery   . Knee  surgery     History reviewed. No pertinent family history.  History  Substance Use Topics  . Smoking status: Current Everyday Smoker    Types: Cigarettes  . Smokeless tobacco: Not on file  . Alcohol Use: No      Review of Systems  Constitutional: Negative for fever and chills.  HENT: Negative for ear pain, congestion, sore throat, facial swelling, trouble swallowing, neck pain, neck stiffness and sinus pressure.   Eyes: Negative for visual disturbance.  Respiratory: Positive for cough. Negative for chest tightness, shortness of breath, wheezing and stridor.   Cardiovascular: Positive for chest pain. Negative for palpitations and leg swelling.  Gastrointestinal: Positive for nausea and vomiting. Negative for abdominal pain.  Neurological: Positive for dizziness and headaches. Negative for seizures, weakness and numbness.    Allergies  Dilaudid; Aspirin; Ibuprofen; Morphine and related; and Tramadol  Home Medications   Current Outpatient Rx  Name Route Sig Dispense Refill  . ACETAMINOPHEN 500 MG PO TABS Oral Take 1,000 mg by mouth every 4 (four) hours as needed. For pain    . BENZONATATE 100 MG PO CAPS Oral Take 1 capsule (100 mg total) by mouth 3 (three) times daily as needed for cough. 20 capsule 0  . PROMETHAZINE HCL 25 MG PO TABS Oral Take 1 tablet (25 mg total) by mouth every 6 (six) hours as needed for nausea. 30 tablet 0    BP 134/82  Pulse 57  Temp(Src) 98 F (36.7 C) (Oral)  Resp 14  SpO2 100%  Physical Exam  Nursing  note and vitals reviewed. Constitutional: He is oriented to person, place, and time. He appears well-developed and well-nourished. No distress.  HENT:  Head: Normocephalic and atraumatic.  Mouth/Throat: Oropharynx is clear and moist.       Bl TM's dull appearing  Eyes: EOM are normal. Pupils are equal, round, and reactive to light.  Neck: Normal range of motion. Neck supple.       No meningismus   Cardiovascular: Normal rate and regular  rhythm.  Exam reveals no gallop and no friction rub.   No murmur heard. Pulmonary/Chest: Effort normal and breath sounds normal. No respiratory distress. He has no wheezes. He has no rales. He exhibits tenderness.       TTP over sternum and costal cartilage.   Abdominal: Soft. Bowel sounds are normal. There is no tenderness. There is no rebound and no guarding.  Musculoskeletal: Normal range of motion. He exhibits no edema and no tenderness.  Neurological: He is alert and oriented to person, place, and time.       5/5 motor, sensation intact  Skin: Skin is warm and dry. No rash noted. No erythema.  Psychiatric: He has a normal mood and affect. His behavior is normal.    ED Course  Procedures (including critical care time)  Labs Reviewed  DIFFERENTIAL - Abnormal; Notable for the following:    Eosinophils Relative 6 (*)    All other components within normal limits  CARDIAC PANEL(CRET KIN+CKTOT+MB+TROPI) - Abnormal; Notable for the following:    Total CK 318 (*)    All other components within normal limits  CBC  BASIC METABOLIC PANEL  BASIC METABOLIC PANEL  CARDIAC PANEL(CRET KIN+CKTOT+MB+TROPI)   Dg Chest 2 View  08/09/2011  *RADIOLOGY REPORT*  Clinical Data: Shortness of breath, chest pain and pressure. Headache and dizziness.  CHEST - 2 VIEW  Comparison: 06/06/2011.  Findings: Trachea is midline.  Heart size normal.  Lungs are clear. No pleural fluid.  IMPRESSION: Negative.  Original Report Authenticated By: Reyes Ivan, M.D.     1. Costochondritis   2. Migraine   3. URI (upper respiratory infection)       Date: 08/09/2011  Rate: 64  Rhythm: normal sinus rhythm  QRS Axis: normal  Intervals: normal  ST/T Wave abnormalities: normal  Conduction Disutrbances:none  Narrative Interpretation:   Old EKG Reviewed: none available   MDM  Chest wall pain likely costochondritis vs pleuritis. Given smoking history ,will do CAD eval though low likelihood.          Loren Racer, MD 08/09/11 [phone]

## 2011-08-09 NOTE — ED Notes (Signed)
Pt reports intermittent chest pain x approx 1 week. Increases with deep inspiration and coughing, chest is tender to palpation. Pt reports non productive cough.

## 2011-08-09 NOTE — ED Notes (Signed)
Pt given bus pass and happy meal to go.

## 2011-08-09 NOTE — Discharge Instructions (Signed)
Chest Wall Pain Chest wall pain is pain in or around the bones and muscles of your chest. This may occur:   On its own (spontaneously).   After a viral illness such as the flu.   Through injur.   From coughing.   Minor exercise.  It may take up to 6 weeks to get better; longer if you must stay physically active in your work and activities. HOME CARE INSTRUCTIONS   Avoid over-tiring physical activity. Try not to strain or perform activities which cause pain. This would include any activities using chest, belly (abdominal) and side muscles, especially if heavy weights are used.   Use ice on the painful area for 15 to 20 minutes per hour while awake for the first 2 days. Place the ice in a plastic bag and place a towel between the bag of ice and your skin.   Only take over-the-counter or prescription medicines for pain, discomfort, or fever as directed by your caregiver.  SEEK IMMEDIATE MEDICAL CARE IF:   Your pain increases or you are very uncomfortable.   An oral temperature above 102 F (38.9 C)develops.   Your chest pains become worse.   You develop new, unexplained problems (symptoms).   You develop nausea, vomiting, sweating or feel light headed.   You develop a cough which produces phlegm (sputum) or you cough up blood.  MAKE SURE YOU:   Understand these instructions.   Will watch your condition.   Will get help right away if you are not doing well or get worse.  Document Released: 06/11/2005 Document Revised: 12/25/2010 Document Reviewed: 01/28/2008 Exit[employer] Patient Information 2012 [employer]. 

## 2011-08-23 ENCOUNTER — Encounter (HOSPITAL_COMMUNITY): Payer: Self-pay | Admitting: Emergency Medicine

## 2011-08-23 ENCOUNTER — Emergency Department (HOSPITAL_COMMUNITY)
Admission: EM | Admit: 2011-08-23 | Discharge: 2011-08-23 | Disposition: A | Discharge status: Home Health | Payer: Self-pay | Attending: Emergency Medicine | Admitting: Emergency Medicine

## 2011-08-23 ENCOUNTER — Emergency Department (HOSPITAL_COMMUNITY): Payer: Self-pay

## 2011-08-23 DIAGNOSIS — R109 Unspecified abdominal pain: Secondary | ICD-10-CM | POA: Insufficient documentation

## 2011-08-23 DIAGNOSIS — R141 Gas pain: Secondary | ICD-10-CM | POA: Insufficient documentation

## 2011-08-23 DIAGNOSIS — K219 Gastro-esophageal reflux disease without esophagitis: Secondary | ICD-10-CM | POA: Insufficient documentation

## 2011-08-23 DIAGNOSIS — R142 Eructation: Secondary | ICD-10-CM | POA: Insufficient documentation

## 2011-08-23 DIAGNOSIS — F17200 Nicotine dependence, unspecified, uncomplicated: Secondary | ICD-10-CM | POA: Insufficient documentation

## 2011-08-23 DIAGNOSIS — R143 Flatulence: Secondary | ICD-10-CM

## 2011-08-23 NOTE — ED Notes (Signed)
States has gas has been eating ok and having reg bm but still having gas no pain

## 2011-08-23 NOTE — Discharge Instructions (Signed)
Bloating  Bloating is the feeling of fullness in your belly. You may feel as though your pants are too tight. Often the cause of bloating is overeating, retaining fluids, or having gas in your bowel. It is also caused by swallowing air and eating foods that cause gas. Irritable bowel syndrome is one of the most common causes of bloating. Constipation is also a common cause. Sometimes more serious problems can cause bloating.  SYMPTOMS   Usually there is a feeling of fullness, as though your abdomen is bulged out. There may be mild discomfort.   DIAGNOSIS   Usually no particular testing is necessary for most bloating. If the condition persists and seems to become worse, your caregiver may do additional testing.   TREATMENT   · There is no direct treatment for bloating.   · Do not put gas into the bowel. Avoid chewing gum and sucking on candy. These tend to make you swallow air. Swallowing air can also be a nervous habit. Try to avoid this.   · Avoiding high residue diets will help. Eat foods with soluble fibers (examples include root vegetables, apples, or barley) and substitute dairy products with soy and rice products. This helps irritable bowel syndrome.   · If constipation is the cause, then a high residue diet with more fiber will help.   · Avoid carbonated beverages.   · Over-the-counter preparations are available that help reduce gas. Your pharmacist can help you with this.   SEEK MEDICAL CARE IF:   · Bloating continues and seems to be getting worse.   · You notice a weight gain.   · You have a weight loss but the bloating is getting worse.   · You have changes in your bowel habits or develop nausea or vomiting.   SEEK IMMEDIATE MEDICAL CARE IF:   · You develop shortness of breath or swelling in your legs.   · You have an increase in abdominal pain or develop chest pain.   Document Released: 04/11/2006 Document Revised: 02/21/2011 Document Reviewed: 05/30/2007  [employer]® Patient Information ©2012 [employer].

## 2011-08-23 NOTE — ED Provider Notes (Signed)
History     CSN: [id number]  Arrival date & time 08/23/11  1246   First MD Initiated Contact with Patient 08/23/11 1432      Chief Complaint  Patient presents with  . Abdominal Pain    (Consider location/radiation/quality/duration/timing/severity/associated sxs/prior treatment) HPI Comments: Patient complains of 3 days of gas and bloating and increased flatulence. Denies abdominal pain, nausea, vomiting, fever or chills. He is having normal bowel movement daily. Eating normally. He is taking Gas-X without relief. He is also on Tums for acid reflux disease. No previous abdominal surgeries.  The history is provided by the patient.    Past Medical History  Diagnosis Date  . Arthritis   . GERD (gastroesophageal reflux disease)   . Bronchitis   . Seasonal allergies     Past Surgical History  Procedure Date  . Fracture surgery   . Knee surgery     No family history on file.  History  Substance Use Topics  . Smoking status: Current Everyday Smoker    Types: Cigarettes  . Smokeless tobacco: Not on file  . Alcohol Use: No      Review of Systems  Constitutional: Negative for fever, activity change and appetite change.  HENT: Negative for congestion and rhinorrhea.   Respiratory: Negative for cough, chest tightness and shortness of breath.   Cardiovascular: Negative for chest pain.  Gastrointestinal: Negative for nausea, vomiting, abdominal pain and diarrhea.  Genitourinary: Negative for dysuria and testicular pain.  Musculoskeletal: Negative for back pain.  Skin: Negative for rash.  Neurological: Negative for dizziness, light-headedness and numbness.    Allergies  Dilaudid; Aspirin; Ibuprofen; Morphine and related; Toradol; and Tramadol  Home Medications  No current outpatient prescriptions on file.  BP 128/75  Pulse 72  Temp(Src) 98.3 F (36.8 C) (Oral)  Resp 20  SpO2 100%  Physical Exam  Constitutional: He is oriented to person, place, and time. He  appears well-developed and well-nourished. No distress.       Talking on the phone no distress  HENT:  Head: Normocephalic and atraumatic.  Mouth/Throat: Oropharynx is clear and moist. No oropharyngeal exudate.  Eyes: Conjunctivae are normal. Pupils are equal, round, and reactive to light.  Neck: Normal range of motion. Neck supple.  Cardiovascular: Normal rate, regular rhythm and normal heart sounds.   No murmur heard. Pulmonary/Chest: Effort normal and breath sounds normal. No respiratory distress.  Abdominal: Soft. There is no tenderness. There is no rebound and no guarding.  Musculoskeletal: Normal range of motion. He exhibits no edema and no tenderness.  Neurological: He is alert and oriented to person, place, and time. No cranial nerve deficit.  Skin: Skin is warm.    ED Course  Procedures (including critical care time)  Labs Reviewed - No data to display Dg Abd Acute W/chest  08/23/2011  *RADIOLOGY REPORT*  Clinical Data: Abdominal pain.  ACUTE ABDOMEN SERIES (ABDOMEN 2 VIEW & CHEST 1 VIEW)  Comparison: Chest x-ray 08/09/2011.  Findings: The bowel gas pattern is normal.  There is no evidence of free intraperitoneal air.  No suspicious radio-opaque calculi or other significant radiographic abnormality is seen. Heart size and mediastinal contours are within normal limits.  Both lungs are clear.  IMPRESSION: No acute findings.  Original Report Authenticated By: Cyndie Chime, M.D.     1. Flatulence       MDM  Gas and bloating with increased flatulence. Abdomen soft and nontender. Nontoxic.      Glynn Octave, MD 08/23/11 2147

## 2011-11-11 ENCOUNTER — Emergency Department (HOSPITAL_COMMUNITY)
Admission: EM | Admit: 2011-11-11 | Discharge: 2011-11-11 | Disposition: A | Discharge status: Home / Self Care | Payer: Self-pay | Attending: Emergency Medicine | Admitting: Emergency Medicine

## 2011-11-11 ENCOUNTER — Encounter (HOSPITAL_COMMUNITY): Payer: Self-pay | Admitting: *Deleted

## 2011-11-11 DIAGNOSIS — R112 Nausea with vomiting, unspecified: Secondary | ICD-10-CM | POA: Insufficient documentation

## 2011-11-11 DIAGNOSIS — J351 Hypertrophy of tonsils: Secondary | ICD-10-CM | POA: Insufficient documentation

## 2011-11-11 DIAGNOSIS — IMO0001 Reserved for inherently not codable concepts without codable children: Secondary | ICD-10-CM | POA: Insufficient documentation

## 2011-11-11 DIAGNOSIS — J020 Streptococcal pharyngitis: Secondary | ICD-10-CM | POA: Insufficient documentation

## 2011-11-11 DIAGNOSIS — K219 Gastro-esophageal reflux disease without esophagitis: Secondary | ICD-10-CM | POA: Insufficient documentation

## 2011-11-11 DIAGNOSIS — R070 Pain in throat: Secondary | ICD-10-CM | POA: Insufficient documentation

## 2011-11-11 DIAGNOSIS — R509 Fever, unspecified: Secondary | ICD-10-CM | POA: Insufficient documentation

## 2011-11-11 DIAGNOSIS — M129 Arthropathy, unspecified: Secondary | ICD-10-CM | POA: Insufficient documentation

## 2011-11-11 MED ORDER — DEXAMETHASONE SODIUM PHOSPHATE 10 MG/ML IJ SOLN
10.0000 mg | Freq: Once | INTRAMUSCULAR | Status: AC
  Administered 2011-11-11: 10 mg via INTRAMUSCULAR
  Filled 2011-11-11: qty 1

## 2011-11-11 MED ORDER — ACETAMINOPHEN 325 MG PO TABS
650.0000 mg | ORAL_TABLET | Freq: Once | ORAL | Status: AC
  Administered 2011-11-11: 650 mg via ORAL

## 2011-11-11 MED ORDER — ACETAMINOPHEN 325 MG PO TABS
ORAL_TABLET | ORAL | Status: AC
  Filled 2011-11-11: qty 2

## 2011-11-11 MED ORDER — ONDANSETRON 8 MG PO TBDP
8.0000 mg | ORAL_TABLET | Freq: Once | ORAL | Status: AC
  Administered 2011-11-11: 8 mg via ORAL
  Filled 2011-11-11: qty 1

## 2011-11-11 MED ORDER — PENICILLIN G BENZATHINE 1200000 UNIT/2ML IM SUSP
1.2000 10*6.[IU] | Freq: Once | INTRAMUSCULAR | Status: AC
  Administered 2011-11-11: 1.2 10*6.[IU] via INTRAMUSCULAR
  Filled 2011-11-11: qty 2

## 2011-11-11 NOTE — Discharge Instructions (Signed)

## 2011-11-11 NOTE — ED Provider Notes (Signed)
History     CSN: [id number]  Arrival date & time 11/11/11  0401   First MD Initiated Contact with Patient 11/11/11 0435      Chief Complaint  Patient presents with  . Emesis  . Chills    (Consider location/radiation/quality/duration/timing/severity/associated sxs/prior treatment) HPI Comments: Patient presents with sore throat and fever for the last 3-4 days.  He's had associated nausea and vomiting without significant abdominal pain.  He notes generalized body aches.  It does hurt to swallow.  No specific sick contacts.  No cough.  No diarrhea.  Patient is a 26 y.o. male presenting with vomiting. The history is provided by the patient. No language interpreter was used.  Emesis  This is a new problem. The current episode started more than 2 days ago. The problem occurs 2 to 4 times per day. The problem has not changed since onset.The emesis has an appearance of stomach contents. The maximum temperature recorded prior to his arrival was 102 to 102.9 F. Associated symptoms include chills and a fever. Pertinent negatives include no abdominal pain, no cough, no diarrhea, no headaches and no URI.    Past Medical History  Diagnosis Date  . Arthritis   . GERD (gastroesophageal reflux disease)   . Bronchitis   . Seasonal allergies     Past Surgical History  Procedure Date  . Fracture surgery   . Knee surgery     History reviewed. No pertinent family history.  History  Substance Use Topics  . Smoking status: Current Everyday Smoker -- 1 years    Types: Cigarettes  . Smokeless tobacco: Not on file  . Alcohol Use: No      Review of Systems  Constitutional: Positive for fever and chills.  HENT: Positive for sore throat.   Eyes: Negative.  Negative for discharge and redness.  Respiratory: Negative.  Negative for cough and shortness of breath.   Cardiovascular: Negative.  Negative for chest pain.  Gastrointestinal: Positive for nausea and vomiting. Negative for abdominal pain  and diarrhea.  Genitourinary: Negative.  Negative for hematuria.  Musculoskeletal: Negative.  Negative for back pain.  Skin: Negative.  Negative for color change and rash.  Neurological: Negative for syncope and headaches.  Hematological: Negative.  Negative for adenopathy.  Psychiatric/Behavioral: Negative.  Negative for confusion.  All other systems reviewed and are negative.    Allergies  Dilaudid; Aspirin; Ibuprofen; Morphine and related; Toradol; and Tramadol  Home Medications   Current Outpatient Rx  Name Route Sig Dispense Refill  . PHENOL 1.4 % MT LIQD Mouth/Throat Use as directed 1 spray in the mouth or throat as needed. For sore throat      BP 130/71  Pulse 94  Temp(Src) 102.3 F (39.1 C) (Oral)  SpO2 100%  Physical Exam  Nursing note and vitals reviewed. Constitutional: He is oriented to person, place, and time. He appears well-developed and well-nourished.  Non-toxic appearance. He does not have a sickly appearance.  HENT:  Head: Normocephalic and atraumatic.  Mouth/Throat: Oropharyngeal exudate present.       Bilaterally swollen tonsils with erythema and exudates.  Uvula is midline   Eyes: Conjunctivae, EOM and lids are normal. Pupils are equal, round, and reactive to light.  Neck: Trachea normal, normal range of motion and full passive range of motion without pain. Neck supple.  Cardiovascular: Normal rate, regular rhythm and normal heart sounds.   Pulmonary/Chest: Effort normal and breath sounds normal. No respiratory distress. He has no wheezes. He has no  rales.  Abdominal: Soft. Normal appearance. He exhibits no distension. There is no tenderness. There is no rebound and no CVA tenderness.  Musculoskeletal: Normal range of motion.  Neurological: He is alert and oriented to person, place, and time. He has normal strength.  Skin: Skin is warm, dry and intact. No rash noted.  Psychiatric: He has a normal mood and affect. His behavior is normal. Judgment and  thought content normal.    ED Course  Procedures (including critical care time)  Labs Reviewed - No data to display No results found.   No diagnosis found.    MDM  Patient with strep pharyngitis clinically on exam and consistent with his symptoms.  He'll receive the penicillin shot here for antibiotics, Decadron for the swelling, Tylenol for his fever and Zofran for his nausea.  Patient has been advised to continue fluid intake at home.  I fee he is safe for discharge home at this point in time she's controlling his secretions.  He can also continue taking Tylenol at home for fever.        Nat Christen, MD 11/11/11 [phone]

## 2011-11-11 NOTE — ED Notes (Signed)
Patient is alert and oriented x3.  He was given DC instructions and follow up visit instructions.  Patient gave verbal understanding.  He was DC ambulatory under his own power to home.  V/S stable.  He was not showing any signs of distress on DC 

## 2011-11-11 NOTE — ED Notes (Signed)
EMS called to home.  Found patient in sitting position complaining of chills and vomiting With generalized weakness and malaise

## 2012-03-21 ENCOUNTER — Emergency Department (HOSPITAL_COMMUNITY)
Admission: EM | Admit: 2012-03-21 | Discharge: 2012-03-21 | Disposition: A | Discharge status: Home / Self Care | Payer: Self-pay | Attending: Emergency Medicine | Admitting: Emergency Medicine

## 2012-03-21 ENCOUNTER — Emergency Department (HOSPITAL_COMMUNITY): Payer: Self-pay

## 2012-03-21 ENCOUNTER — Encounter (HOSPITAL_COMMUNITY): Payer: Self-pay

## 2012-03-21 DIAGNOSIS — R509 Fever, unspecified: Secondary | ICD-10-CM | POA: Insufficient documentation

## 2012-03-21 DIAGNOSIS — IMO0001 Reserved for inherently not codable concepts without codable children: Secondary | ICD-10-CM | POA: Insufficient documentation

## 2012-03-21 DIAGNOSIS — M791 Myalgia, unspecified site: Secondary | ICD-10-CM

## 2012-03-21 DIAGNOSIS — Z91038 Other insect allergy status: Secondary | ICD-10-CM | POA: Insufficient documentation

## 2012-03-21 DIAGNOSIS — M129 Arthropathy, unspecified: Secondary | ICD-10-CM | POA: Insufficient documentation

## 2012-03-21 DIAGNOSIS — Z886 Allergy status to analgesic agent status: Secondary | ICD-10-CM | POA: Insufficient documentation

## 2012-03-21 DIAGNOSIS — R51 Headache: Secondary | ICD-10-CM | POA: Insufficient documentation

## 2012-03-21 DIAGNOSIS — F17200 Nicotine dependence, unspecified, uncomplicated: Secondary | ICD-10-CM | POA: Insufficient documentation

## 2012-03-21 DIAGNOSIS — K219 Gastro-esophageal reflux disease without esophagitis: Secondary | ICD-10-CM | POA: Insufficient documentation

## 2012-03-21 LAB — CBC WITH DIFFERENTIAL/PLATELET
Eosinophils Absolute: 0.2 10*3/uL (ref 0.0–0.7)
Eosinophils Relative: 1 % (ref 0–5)
HCT: 39 % (ref 39.0–52.0)
Hemoglobin: 13.5 g/dL (ref 13.0–17.0)
Lymphocytes Relative: 7 % — ABNORMAL LOW (ref 12–46)
Lymphs Abs: 1.3 10*3/uL (ref 0.7–4.0)
MCH: 30.2 pg (ref 26.0–34.0)
MCV: 87.2 fL (ref 78.0–100.0)
Monocytes Relative: 6 % (ref 3–12)
Platelets: 256 10*3/uL (ref 150–400)
RBC: 4.47 MIL/uL (ref 4.22–5.81)
WBC: 17.9 10*3/uL — ABNORMAL HIGH (ref 4.0–10.5)

## 2012-03-21 LAB — URINALYSIS, ROUTINE W REFLEX MICROSCOPIC
Glucose, UA: NEGATIVE mg/dL
Hgb urine dipstick: NEGATIVE
Ketones, ur: NEGATIVE mg/dL
Leukocytes, UA: NEGATIVE
pH: 6 (ref 5.0–8.0)

## 2012-03-21 LAB — BASIC METABOLIC PANEL
BUN: 12 mg/dL (ref 6–23)
CO2: 23 mEq/L (ref 19–32)
Calcium: 9.3 mg/dL (ref 8.4–10.5)
GFR calc non Af Amer: >90 mL/min (ref >90)
Glucose, Bld: 99 mg/dL (ref 70–99)
Sodium: 135 mEq/L (ref 135–145)

## 2012-03-21 MED ORDER — SODIUM CHLORIDE 0.9 % IV BOLUS (SEPSIS)
1000.0000 mL | Freq: Once | INTRAVENOUS | Status: AC
  Administered 2012-03-21: 1000 mL via INTRAVENOUS

## 2012-03-21 MED ORDER — METOCLOPRAMIDE HCL 5 MG/ML IJ SOLN
10.0000 mg | Freq: Once | INTRAMUSCULAR | Status: AC
  Administered 2012-03-21: 10 mg via INTRAVENOUS
  Filled 2012-03-21: qty 2

## 2012-03-21 MED ORDER — DOXYCYCLINE HYCLATE 100 MG PO CAPS: 100.0000 mg | ORAL_CAPSULE | Freq: Two times a day (BID) | ORAL | Status: DC

## 2012-03-21 MED ORDER — ACETAMINOPHEN 325 MG PO TABS
650.0000 mg | ORAL_TABLET | Freq: Once | ORAL | Status: AC
  Administered 2012-03-21: 650 mg via ORAL
  Filled 2012-03-21: qty 2

## 2012-03-21 MED ORDER — DIPHENHYDRAMINE HCL 50 MG/ML IJ SOLN
25.0000 mg | Freq: Once | INTRAMUSCULAR | Status: AC
Start: 2012-03-21 — End: 2012-03-21
  Administered 2012-03-21: 25 mg via INTRAVENOUS
  Filled 2012-03-21: qty 1

## 2012-03-21 NOTE — ED Provider Notes (Signed)
Medical screening examination/treatment/procedure(s) were performed by non-physician practitioner and as supervising physician I was immediately available for consultation/collaboration.  Sunnie Nielsen, MD 03/21/12 0800

## 2012-03-21 NOTE — ED Provider Notes (Signed)
History     CSN: [id number]  Arrival date & time 03/21/12  0124   First MD Initiated Contact with Patient 03/21/12 0208      Chief Complaint  Patient presents with  . Chills   HPI  History provided by the patient. Patient is a 26 year old male with history of seasonal allergies, bronchitis who presents with complaints of generalized fatigue, fever and chills. Patient states that symptoms began earlier this week on Monday and have been persistent. Patient does report his 21-month-old child has been sick with cough and cold symptoms. Patient denies having any cough. Denies any nausea vomiting or diarrhea symptoms. Patient has had slight sore throat. Denies any rash and change. He denies any urinary problems. Patient denies any recent travel. Denies any tick bite or exposure.    Past Medical History  Diagnosis Date  . Arthritis   . GERD (gastroesophageal reflux disease)   . Bronchitis   . Seasonal allergies     Past Surgical History  Procedure Date  . Fracture surgery   . Knee surgery     No family history on file.  History  Substance Use Topics  . Smoking status: Current Every Day Smoker -- 1 years    Types: Cigarettes  . Smokeless tobacco: Not on file  . Alcohol Use: No      Review of Systems  Constitutional: Positive for fever, chills and fatigue.  HENT: Positive for sore throat. Negative for congestion and rhinorrhea.   Respiratory: Negative for cough and shortness of breath.   Cardiovascular: Negative for chest pain.  Gastrointestinal: Negative for nausea, vomiting, abdominal pain, diarrhea and constipation.  Genitourinary: Negative for dysuria, frequency, hematuria and flank pain.  Musculoskeletal: Positive for myalgias.  Skin: Negative for rash.  Neurological: Positive for weakness and headaches. Negative for light-headedness.    Allergies  Bee venom; Dilaudid; Aspirin; Ibuprofen; Morphine and related; Toradol; and Tramadol  Home Medications   Current  Outpatient Rx  Name Route Sig Dispense Refill  . PHENOL 1.4 % MT LIQD Mouth/Throat Use as directed 1 spray in the mouth or throat as needed. For sore throat      BP 127/80  Pulse 91  Temp 98.7 F (37.1 C) (Oral)  Resp 20  Ht 5\' 9"  (1.753 m)  Wt 165 lb (74.844 kg)  BMI 24.37 kg/m2  SpO2 100%  Physical Exam  Nursing note and vitals reviewed. Constitutional: He is oriented to person, place, and time. He appears well-developed and well-nourished. No distress.  HENT:  Head: Normocephalic.  Right Ear: Tympanic membrane normal.  Left Ear: Tympanic membrane normal.  Nose: Nose normal.  Mouth/Throat: Oropharynx is clear and moist.  Neck: Normal range of motion. Neck supple.       No meningeal signs  Cardiovascular: Normal rate and regular rhythm.   Pulmonary/Chest: Effort normal and breath sounds normal. No respiratory distress. He has no wheezes. He has no rales.  Abdominal: Soft. There is no tenderness. There is no rebound and no guarding.  Musculoskeletal: He exhibits no edema and no tenderness.  Lymphadenopathy:    He has no cervical adenopathy.  Neurological: He is alert and oriented to person, place, and time.  Skin: Skin is warm. No rash noted. He is not diaphoretic. No erythema.  Psychiatric: He has a normal mood and affect. His behavior is normal.    ED Course  Procedures   Results for orders placed during the hospital encounter of 03/21/12  CBC WITH DIFFERENTIAL  Component Value Range   WBC 17.9 (*) 4.0 - 10.5 K/uL   RBC 4.47  4.22 - 5.81 MIL/uL   Hemoglobin 13.5  13.0 - 17.0 g/dL   HCT 54.0  98.1 - 19.1 %   MCV 87.2  78.0 - 100.0 fL   MCH 30.2  26.0 - 34.0 pg   MCHC 34.6  30.0 - 36.0 g/dL   RDW 47.8  29.5 - 62.1 %   Platelets 256  150 - 400 K/uL   Neutrophils Relative 86 (*) 43 - 77 %   Neutro Abs 15.4 (*) 1.7 - 7.7 K/uL   Lymphocytes Relative 7 (*) 12 - 46 %   Lymphs Abs 1.3  0.7 - 4.0 K/uL   Monocytes Relative 6  3 - 12 %   Monocytes Absolute 1.0  0.1  - 1.0 K/uL   Eosinophils Relative 1  0 - 5 %   Eosinophils Absolute 0.2  0.0 - 0.7 K/uL   Basophils Relative 0  0 - 1 %   Basophils Absolute 0.0  0.0 - 0.1 K/uL  BASIC METABOLIC PANEL      Component Value Range   Sodium 135  135 - 145 mEq/L   Potassium 3.4 (*) 3.5 - 5.1 mEq/L   Chloride 99  96 - 112 mEq/L   CO2 23  19 - 32 mEq/L   Glucose, Bld 99  70 - 99 mg/dL   BUN 12  6 - 23 mg/dL   Creatinine, Ser 3.08  0.50 - 1.35 mg/dL   Calcium 9.3  8.4 - 65.7 mg/dL   GFR calc non Af Amer >90  >90 mL/min   GFR calc Af Amer >90  >90 mL/min  URINALYSIS, ROUTINE W REFLEX MICROSCOPIC      Component Value Range   Color, Urine YELLOW  YELLOW   APPearance CLEAR  CLEAR   Specific Gravity, Urine 1.034 (*) 1.005 - 1.030   pH 6.0  5.0 - 8.0   Glucose, UA NEGATIVE  NEGATIVE mg/dL   Hgb urine dipstick NEGATIVE  NEGATIVE   Bilirubin Urine NEGATIVE  NEGATIVE   Ketones, ur NEGATIVE  NEGATIVE mg/dL   Protein, ur NEGATIVE  NEGATIVE mg/dL   Urobilinogen, UA 0.2  0.0 - 1.0 mg/dL   Nitrite NEGATIVE  NEGATIVE   Leukocytes, UA NEGATIVE  NEGATIVE      Dg Chest 2 View  03/21/2012  *RADIOLOGY REPORT*  Clinical Data: Chest pain.  Chills.  CHEST - 2 VIEW  Comparison: 08/09/2011.  Findings:  Cardiopericardial silhouette within normal limits. Mediastinal contours normal. Trachea midline.  No airspace disease or effusion.  IMPRESSION: Normal two-view chest.   Original Report Authenticated By: Andreas Newport, M.D.      1. Fever   2. Headache   3. Myalgia       MDM  2:20 AM patient seen and evaluated. Patient does not appear acutely or toxic. Patient with normal regions, O2 sats and heart rate. Patient is afebrile.   Pt with elevated WBC.  No other concerning findings on labs or CXR.  Pt with unremarkable vitals.      Angus Seller, [place] 03/21/12 [phone]

## 2012-03-21 NOTE — ED Notes (Signed)
Pt c/o of headache, sore throat, and chills since Monday.

## 2012-03-21 NOTE — ED Notes (Signed)
Pt states "cold chills", feeling "weak", "headaches on and off since Monday-arrives by EMS

## 2012-04-21 ENCOUNTER — Encounter (HOSPITAL_COMMUNITY): Payer: Self-pay | Admitting: Emergency Medicine

## 2012-04-21 ENCOUNTER — Emergency Department (HOSPITAL_COMMUNITY): Payer: Self-pay

## 2012-04-21 ENCOUNTER — Emergency Department (HOSPITAL_COMMUNITY)
Admission: EM | Admit: 2012-04-21 | Discharge: 2012-04-21 | Disposition: A | Discharge status: Home Health | Payer: Self-pay | Attending: Emergency Medicine | Admitting: Emergency Medicine

## 2012-04-21 DIAGNOSIS — M129 Arthropathy, unspecified: Secondary | ICD-10-CM | POA: Insufficient documentation

## 2012-04-21 DIAGNOSIS — K219 Gastro-esophageal reflux disease without esophagitis: Secondary | ICD-10-CM | POA: Insufficient documentation

## 2012-04-21 DIAGNOSIS — J301 Allergic rhinitis due to pollen: Secondary | ICD-10-CM | POA: Insufficient documentation

## 2012-04-21 DIAGNOSIS — F17200 Nicotine dependence, unspecified, uncomplicated: Secondary | ICD-10-CM | POA: Insufficient documentation

## 2012-04-21 DIAGNOSIS — R109 Unspecified abdominal pain: Secondary | ICD-10-CM

## 2012-04-21 DIAGNOSIS — Z8709 Personal history of other diseases of the respiratory system: Secondary | ICD-10-CM | POA: Insufficient documentation

## 2012-04-21 DIAGNOSIS — R0789 Other chest pain: Secondary | ICD-10-CM

## 2012-04-21 DIAGNOSIS — R071 Chest pain on breathing: Secondary | ICD-10-CM | POA: Insufficient documentation

## 2012-04-21 LAB — URINALYSIS, DIPSTICK ONLY
Bilirubin Urine: NEGATIVE
Glucose, UA: NEGATIVE mg/dL
Hgb urine dipstick: NEGATIVE
Specific Gravity, Urine: 1.028 (ref 1.005–1.030)

## 2012-04-21 MED ORDER — HYDROCODONE-ACETAMINOPHEN 5-325 MG PO TABS: 1.0000 | ORAL_TABLET | Freq: Four times a day (QID) | ORAL | Status: DC | PRN

## 2012-04-21 MED ORDER — HYDROCODONE-ACETAMINOPHEN 5-325 MG PO TABS
2.0000 | ORAL_TABLET | Freq: Once | ORAL | Status: AC
  Administered 2012-04-21: 2 via ORAL
  Filled 2012-04-21: qty 2

## 2012-04-21 NOTE — ED Notes (Signed)
Signing tablet in room would not work

## 2012-04-21 NOTE — ED Notes (Signed)
Pt reports left-sided flank pain that started on Friday. Denies any urinary symptoms or abd pain. Reports pain radiates up into rib on the left side.

## 2012-04-21 NOTE — ED Notes (Signed)
Pt arrived by EMS. C/o left flank pain since Friday.

## 2012-04-21 NOTE — ED Notes (Signed)
Patient transported to X-ray 

## 2012-04-21 NOTE — ED Provider Notes (Signed)
History     CSN: [id number]  Arrival date & time 04/21/12  1657   First MD Initiated Contact with Patient 04/21/12 2024      Chief Complaint  Patient presents with  . Flank Pain    (Consider location/radiation/quality/duration/timing/severity/associated sxs/prior treatment) Patient is a 26 y.o. male presenting with flank pain. The history is provided by the patient. No language interpreter was used.  Flank Pain This is a new problem. The current episode started in the past 7 days. The problem occurs constantly. The problem has been gradually worsening. Associated symptoms include chest pain (L lower lateral). Pertinent negatives include no abdominal pain, arthralgias, coughing, diaphoresis, fatigue, fever, headaches, nausea, neck pain, rash, sore throat, swollen glands, vertigo, vomiting or weakness. The symptoms are aggravated by bending. He has tried acetaminophen for the symptoms. The treatment provided mild relief.    Past Medical History  Diagnosis Date  . Arthritis   . GERD (gastroesophageal reflux disease)   . Bronchitis   . Seasonal allergies     Past Surgical History  Procedure Date  . Fracture surgery   . Knee surgery     History reviewed. No pertinent family history.  History  Substance Use Topics  . Smoking status: Current Every Day Smoker -- 1 years    Types: Cigarettes  . Smokeless tobacco: Not on file  . Alcohol Use: No      Review of Systems  Constitutional: Negative for fever, diaphoresis, activity change, appetite change and fatigue.  HENT: Negative for sore throat and neck pain.   Eyes: Negative for discharge and visual disturbance.  Respiratory: Negative for cough, choking and shortness of breath.   Cardiovascular: Positive for chest pain (L lower lateral). Negative for leg swelling.  Gastrointestinal: Negative for nausea, vomiting, abdominal pain, diarrhea and constipation.  Genitourinary: Positive for flank pain. Negative for dysuria and  difficulty urinating.  Musculoskeletal: Negative for back pain and arthralgias.  Skin: Negative for color change, pallor and rash.  Neurological: Negative for dizziness, vertigo, speech difficulty, weakness, light-headedness and headaches.  Psychiatric/Behavioral: Negative for behavioral problems and agitation.  All other systems reviewed and are negative.    Allergies  Bee venom; Dilaudid; Aspirin; Ibuprofen; Morphine and related; Toradol; and Tramadol  Home Medications   Current Outpatient Rx  Name Route Sig Dispense Refill  . ACETAMINOPHEN 500 MG PO TABS Oral Take 1,000 mg by mouth every 6 (six) hours as needed. For pain      BP 123/64  Pulse 85  Temp 98.2 F (36.8 C) (Oral)  Resp 18  SpO2 100%  Physical Exam  Constitutional: He appears well-developed. No distress.  HENT:  Head: Normocephalic and atraumatic.  Mouth/Throat: No oropharyngeal exudate.  Eyes: EOM are normal. Pupils are equal, round, and reactive to light. Right eye exhibits no discharge. Left eye exhibits no discharge.  Neck: Normal range of motion. Neck supple. No JVD present.  Cardiovascular: Normal rate, regular rhythm and normal heart sounds.   Pulmonary/Chest: Effort normal and breath sounds normal. No stridor. No respiratory distress. He has no wheezes. He has no rales. He exhibits tenderness (mild L lower lateral chest TTP).  Abdominal: Soft. Bowel sounds are normal. There is tenderness (mild TTP to LUQ under L ribcage on more lateral aspect.). There is no guarding.  Genitourinary: Penis normal.  Musculoskeletal: Normal range of motion. He exhibits no edema and no tenderness (no CVAT).  Neurological: He is alert. No cranial nerve deficit. He exhibits normal muscle tone.  Skin: Skin  is warm and dry. He is not diaphoretic. No erythema. No pallor.  Psychiatric: He has a normal mood and affect. His behavior is normal. Judgment and thought content normal.    ED Course  Procedures (including critical care  time)   Labs Reviewed  URINALYSIS, DIPSTICK ONLY   Dg Chest 2 View  04/21/2012  *RADIOLOGY REPORT*  Clinical Data:  Left lower lateral chest pain.  CHEST - 2 VIEW  Comparison: 03/21/2012  Findings: The heart size and mediastinal contours are within normal limits.  Both lungs are clear.  The visualized skeletal structures are unremarkable.  IMPRESSION: No active disease.   Original Report Authenticated By: Reola Calkins, M.D.      1. Chest wall pain   2. Left sided abdominal pain       MDM  Patient was primarily tender over the left lateral chest. He also has some mild tenderness in his left upper quadrant. He had no guarding or rigidity, overall benign abdominal exam. Considered renal colic but with no blood on microscopic UA I doubt this is the etiology. Obtain chest x-ray due to lower left chest pain which was negative for any acute abnormalities. Doubt pneumonia, PE, effusion, pneumothorax. Likely musculoskeletal etiology. Renal colic, gastritis or PUD, splenic rupture. Responded well to Vicodin. Will give a short course of this due to his allergies to ibuprofen and aspirin. Pt deemed stable for discharge. Return precautions were provided and pt expressed understanding to return to ED if any acute symptoms return. Follow up was instructed which pt also expressed understanding. All questions were answered and pt was in agreement w/ plan.        Warrick Parisian, MD 04/21/12 [phone]

## 2012-04-22 NOTE — ED Provider Notes (Signed)
I saw and evaluated the patient, reviewed the resident's note and I agree with the findings and plan.   Lyanne Co, MD 04/22/12 2219

## 2012-07-07 ENCOUNTER — Encounter (HOSPITAL_COMMUNITY): Payer: Self-pay | Admitting: *Deleted

## 2012-07-07 ENCOUNTER — Emergency Department (HOSPITAL_COMMUNITY)
Admission: EM | Admit: 2012-07-07 | Discharge: 2012-07-13 | Disposition: A | Discharge status: Home / Self Care | Payer: Self-pay | Attending: Emergency Medicine | Admitting: Emergency Medicine

## 2012-07-07 DIAGNOSIS — Z8739 Personal history of other diseases of the musculoskeletal system and connective tissue: Secondary | ICD-10-CM | POA: Insufficient documentation

## 2012-07-07 DIAGNOSIS — R45851 Suicidal ideations: Secondary | ICD-10-CM | POA: Insufficient documentation

## 2012-07-07 DIAGNOSIS — F3289 Other specified depressive episodes: Secondary | ICD-10-CM | POA: Insufficient documentation

## 2012-07-07 DIAGNOSIS — Z8709 Personal history of other diseases of the respiratory system: Secondary | ICD-10-CM | POA: Insufficient documentation

## 2012-07-07 DIAGNOSIS — F411 Generalized anxiety disorder: Secondary | ICD-10-CM | POA: Insufficient documentation

## 2012-07-07 DIAGNOSIS — IMO0002 Reserved for concepts with insufficient information to code with codable children: Secondary | ICD-10-CM | POA: Insufficient documentation

## 2012-07-07 DIAGNOSIS — Z8659 Personal history of other mental and behavioral disorders: Secondary | ICD-10-CM | POA: Insufficient documentation

## 2012-07-07 DIAGNOSIS — F17200 Nicotine dependence, unspecified, uncomplicated: Secondary | ICD-10-CM | POA: Insufficient documentation

## 2012-07-07 DIAGNOSIS — Z8719 Personal history of other diseases of the digestive system: Secondary | ICD-10-CM | POA: Insufficient documentation

## 2012-07-07 DIAGNOSIS — F329 Major depressive disorder, single episode, unspecified: Secondary | ICD-10-CM

## 2012-07-07 HISTORY — DX: Bipolar disorder, unspecified: F31.9

## 2012-07-07 HISTORY — DX: Attention-deficit hyperactivity disorder, unspecified type: F90.9

## 2012-07-07 LAB — CBC WITH DIFFERENTIAL/PLATELET
Eosinophils Relative: 2 % (ref 0–5)
HCT: 44 % (ref 39.0–52.0)
Lymphocytes Relative: 25 % (ref 12–46)
Lymphs Abs: 1.9 10*3/uL (ref 0.7–4.0)
MCH: 30.8 pg (ref 26.0–34.0)
MCV: 89.8 fL (ref 78.0–100.0)
Monocytes Absolute: 0.3 10*3/uL (ref 0.1–1.0)
Monocytes Relative: 4 % (ref 3–12)
RBC: 4.9 MIL/uL (ref 4.22–5.81)
WBC: 7.5 10*3/uL (ref 4.0–10.5)

## 2012-07-07 LAB — COMPREHENSIVE METABOLIC PANEL
ALT: 12 U/L (ref 0–53)
BUN: 12 mg/dL (ref 6–23)
CO2: 27 mEq/L (ref 19–32)
Calcium: 10.1 mg/dL (ref 8.4–10.5)
Creatinine, Ser: 1 mg/dL (ref 0.50–1.35)
GFR calc Af Amer: >90 mL/min (ref >90)
GFR calc non Af Amer: >90 mL/min (ref >90)
Glucose, Bld: 102 mg/dL — ABNORMAL HIGH (ref 70–99)

## 2012-07-07 LAB — RAPID URINE DRUG SCREEN, HOSP PERFORMED
Benzodiazepines: NOT DETECTED
Opiates: NOT DETECTED

## 2012-07-07 MED ORDER — NICOTINE 21 MG/24HR TD PT24
21.0000 mg | MEDICATED_PATCH | Freq: Every day | TRANSDERMAL | Status: DC
  Administered 2012-07-07 – 2012-07-13 (×6): 21 mg via TRANSDERMAL
  Filled 2012-07-07 (×7): qty 1

## 2012-07-07 MED ORDER — ACETAMINOPHEN 325 MG PO TABS
650.0000 mg | ORAL_TABLET | ORAL | Status: DC | PRN
  Administered 2012-07-08 – 2012-07-09 (×2): 650 mg via ORAL
  Filled 2012-07-07 (×2): qty 2

## 2012-07-07 MED ORDER — ONDANSETRON HCL 8 MG PO TABS: 4.0000 mg | ORAL_TABLET | Freq: Three times a day (TID) | ORAL | Status: DC | PRN

## 2012-07-07 MED ORDER — LORAZEPAM 1 MG PO TABS
1.0000 mg | ORAL_TABLET | Freq: Three times a day (TID) | ORAL | Status: DC | PRN
  Administered 2012-07-08 – 2012-07-13 (×12): 1 mg via ORAL
  Filled 2012-07-07 (×12): qty 1
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
## 2012-07-07 NOTE — ED Notes (Signed)
Spoke with [facility], reported that pt. Needs a Comptroller.  Security paged and arrived to wand pt.

## 2012-07-07 NOTE — ED Notes (Signed)
Patient's belongings placed Locker 9 and 10.

## 2012-07-07 NOTE — ED Notes (Signed)
All belongings given to significant other.

## 2012-07-07 NOTE — ED Notes (Signed)
Sitter at bedside.
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
## 2012-07-07 NOTE — ED Provider Notes (Signed)
History     CSN: [id number]  Arrival date & time 07/07/12  1120   First MD Initiated Contact with Patient 07/07/12 1227      Chief Complaint  Patient presents with  . Suicidal    (Consider location/radiation/quality/duration/timing/severity/associated sxs/prior treatment) HPI Fred Wilson is a 27 y.o. male who presents with complaint of suicidal ideations. Sates has been feeling this way for some time. States has had mood swings, anger management problems, depression. Today grabbed a knife but didn't do anything with it. States "i need help." States that he has hx of bipolar, depression, mood disorder. Has not had meds in 6 years. Denies any SI. Admits alcohol occasionally, marijuana, cocaine.    Past Medical History  Diagnosis Date  . Arthritis   . GERD (gastroesophageal reflux disease)   . Bronchitis   . Seasonal allergies   . ADHD (attention deficit hyperactivity disorder)   . Bipolar 1 disorder     Past Surgical History  Procedure Date  . Fracture surgery   . Knee surgery     No family history on file.  History  Substance Use Topics  . Smoking status: Current Every Day Smoker -- 1 years    Types: Cigarettes  . Smokeless tobacco: Not on file  . Alcohol Use: No      Review of Systems  Constitutional: Negative for fever and chills.  Respiratory: Negative.   Cardiovascular: Negative.   Psychiatric/Behavioral: Positive for suicidal ideas, behavioral problems and agitation. The patient is nervous/anxious.   All other systems reviewed and are negative.    Allergies  Bee venom; Dilaudid; Aspirin; Ibuprofen; Morphine and related; Toradol; and Tramadol  Home Medications   Current Outpatient Rx  Name  Route  Sig  Dispense  Refill  . ACETAMINOPHEN 500 MG PO TABS   Oral   Take 1,000 mg by mouth every 6 (six) hours as needed. For pain           BP 131/76  Pulse 74  Temp 98.9 F (37.2 C) (Oral)  SpO2 100%  Physical Exam  Nursing note and vitals  reviewed. Constitutional: He is oriented to person, place, and time. He appears well-developed and well-nourished. No distress.  Neck: Neck supple.  Cardiovascular: Normal rate, regular rhythm and normal heart sounds.   Pulmonary/Chest: Effort normal and breath sounds normal. No respiratory distress. He has no wheezes. He has no rales.  Neurological: He is alert and oriented to person, place, and time.  Skin: Skin is warm and dry.  Psychiatric: He has a normal mood and affect. His behavior is normal. Thought content normal.    ED Course  Procedures (including critical care time)  Results for orders placed during the hospital encounter of 07/07/12  CBC WITH DIFFERENTIAL      Component Value Range   WBC 7.5  4.0 - 10.5 K/uL   RBC 4.90  4.22 - 5.81 MIL/uL   Hemoglobin 15.1  13.0 - 17.0 g/dL   HCT 52.8  41.3 - 24.4 %   MCV 89.8  78.0 - 100.0 fL   MCH 30.8  26.0 - 34.0 pg   MCHC 34.3  30.0 - 36.0 g/dL   RDW 01.0  27.2 - 53.6 %   Platelets 257  150 - 400 K/uL   Neutrophils Relative 68  43 - 77 %   Neutro Abs 5.1  1.7 - 7.7 K/uL   Lymphocytes Relative 25  12 - 46 %   Lymphs Abs 1.9  0.7 -  4.0 K/uL   Monocytes Relative 4  3 - 12 %   Monocytes Absolute 0.3  0.1 - 1.0 K/uL   Eosinophils Relative 2  0 - 5 %   Eosinophils Absolute 0.2  0.0 - 0.7 K/uL   Basophils Relative 0  0 - 1 %   Basophils Absolute 0.0  0.0 - 0.1 K/uL  COMPREHENSIVE METABOLIC PANEL      Component Value Range   Sodium 141  135 - 145 mEq/L   Potassium 4.1  3.5 - 5.1 mEq/L   Chloride 102  96 - 112 mEq/L   CO2 27  19 - 32 mEq/L   Glucose, Bld 102 (*) 70 - 99 mg/dL   BUN 12  6 - 23 mg/dL   Creatinine, Ser 4.54  0.50 - 1.35 mg/dL   Calcium 09.8  8.4 - 11.9 mg/dL   Total Protein 8.3  6.0 - 8.3 g/dL   Albumin 4.6  3.5 - 5.2 g/dL   AST 18  0 - 37 U/L   ALT 12  0 - 53 U/L   Alkaline Phosphatase 88  39 - 117 U/L   Total Bilirubin 0.3  0.3 - 1.2 mg/dL   GFR calc non Af Amer >90  >90 mL/min   GFR calc Af Amer >90  >90  mL/min  URINE RAPID DRUG SCREEN (HOSP PERFORMED)      Component Value Range   Opiates NONE DETECTED  NONE DETECTED   Cocaine POSITIVE (*) NONE DETECTED   Benzodiazepines NONE DETECTED  NONE DETECTED   Amphetamines NONE DETECTED  NONE DETECTED   Tetrahydrocannabinol POSITIVE (*) NONE DETECTED   Barbiturates NONE DETECTED  NONE DETECTED  ETHANOL      Component Value Range   Alcohol, Ethyl (B) <11  0 - 11 mg/dL   No results found.  PT with SI, depression, anxiety. Will call act for assessment.   4:10 PM pt medically cleared. Spoke with ACT, will assess.   1. Suicidal ideation   2. Depression       MDM          Lottie Mussel, PA 07/07/12 1610

## 2012-07-08 NOTE — ED Provider Notes (Signed)
Medical screening examination/treatment/procedure(s) were performed by non-physician practitioner and as supervising physician I was immediately available for consultation/collaboration.   Charles B. Sheldon, MD 07/08/12 0708 

## 2012-07-08 NOTE — BH Assessment (Signed)
[facility] Assessment Progress Note      Update:  Called Dr. Jacky Kindle regarding pt after notification from Wes, RN, pt's nurse, that pt continues to voice SI with plan as well as command hallucinations.  Based on this new information, consulted with EDP Lynelle Doctor, who agreed it was OK for writer to call Dr. Jacky Kindle with [facility] with new clinical information, since he recommended discharge.  Per Dr. Jacky Kindle, it is recommended pt be admitted to an inpatient unit at this time and not be discharged.  Informed EDP Knapp of recommendation, and he was in agreement.  ACT will continue bed finding for pt.

## 2012-07-08 NOTE — BH Assessment (Signed)
[facility] Assessment Progress Note   Patient was declined by Donell Sievert at [facility] due to upcoming court date on 01/15.  Patient was discussed with Dr. Dierdre Highman who ordered a telepsychiatry consult.  Dr. Jacky Kindle talked with patient and then talked with Dr. Dierdre Highman.  Dr. Jacky Kindle (telepsychiatrist) recommended discharge of patient.  Dr. Dierdre Highman agrees with this recommendation.

## 2012-07-08 NOTE — ED Notes (Signed)
Telepsych MD consulting with patient at bedside.

## 2012-07-08 NOTE — ED Notes (Signed)
New Nicotine patch 21mg  applied to R shoulder.  Unable to change in the documentation.

## 2012-07-08 NOTE — ED Provider Notes (Signed)
PSY consult - DR Jacky Kindle recs OK for discharge home, follow up outpatient community mental health and alcohol/ drug program. Referrals provided.   Fred Nielsen, MD 07/08/12 [phone]

## 2012-07-08 NOTE — Progress Notes (Signed)
Request received to reinterview patient regarding his psych meds.   He tells me he takes medications for ADHD, Depression, Anxiety, Mood Swings, and Insomnia.  Upon further questioning he is unable to tell me any medication names other than Ritalin and Cogentin, and does not recall any doses.  An electronic prescription history was run and yielded no results.    He states he obtained these medications from [facility], which is currently closed.  Recommend calling 1/15 AM to obtain his prescription history from [facility].  Estella Husk, Pharm.D., BCPS Clinical Pharmacist  Phone [phone] Pager [phone] 07/08/2012, 8:25 PM

## 2012-07-08 NOTE — ED Notes (Signed)
Patient states he is still suicidal despite recommendation to be discharged
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
## 2012-07-08 NOTE — ED Notes (Signed)
At around 0800 attempted to complete a discharge on Mr. Sebesta.  During the discharge pt states that he is still having SI with plan (cutting himself and actively looking for a place to hang a sheet).  He states that he is also hearing command voices telling him to hurt himself.  Notified ACT team Kristen and Dr. Roselyn Bering of pt concerns.  Paged Dr. Jacky Kindle with Tele psych and notified him of situation.  Dr. Jacky Kindle states that with these new changes the pt. Should be admitted at this point.  Notified Dr. Lynelle Doctor and ACT team [name] of changes in pt plan of care.  Pt notified of change in plan of care as well.  Pt voices understanding.

## 2012-07-08 NOTE — ED Provider Notes (Signed)
The patient was assessed by a psychiatrist. His initial recommendation was discharge . Patient was concerned about this and mentioned to the nurse as well as the act team that he still feltsuicidal.   The patient  feels that he is a danger to himself and others. We conveyed this information back to the psychiatrist. With this additional information, he changed his recommendation and felt that the patient actually should be admitted to the hospital for further psychiatric treatment and stabilization.  Celene Kras, MD 07/08/12 [phone]

## 2012-07-09 NOTE — ED Notes (Signed)
Pt states that he is feeling agitated again.  He is still cooperative and non hostile.  PO Ativan administered.

## 2012-07-09 NOTE — ED Notes (Signed)
Sitter at bedside.
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
## 2012-07-10 NOTE — ED Provider Notes (Signed)
BP 116/68  Pulse 62  Temp 96.6 F (35.9 C) (Oral)  Resp 18  SpO2 100% Pt medically stable. Continues to express SI. Awaiting bed at [facility]  Loren Racer, MD 07/10/12 1001
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
## 2012-07-10 NOTE — ED Notes (Signed)
Patient is resting comfortably, with sitter at bedside. 

## 2012-07-10 NOTE — BH Assessment (Signed)
[facility] Assessment Progress Note      Update:  Pt accepted to [facility] wait list per Junious Dresser at 1000.  Oncoming staff will need to check with [facility] daily to ensure pt still on wait list.

## 2012-07-10 NOTE — ED Notes (Signed)
Pt requested medications to be given to him and asked what he took he stated Depakote 500 mg. Contacted [facility] where he said he filled medications and they had never filled anything for him. Informed patient of what the pharmacist said and he mentioned he received his medications from [facility]. [facility] contacted and stated that they had not filled anything for him since January 2012.

## 2012-07-11 NOTE — ED Notes (Signed)
Pt given sandwich and graham crackers.

## 2012-07-11 NOTE — ED Notes (Signed)
Sitter to bedside.  Pt is cooperative. amb to [facility]. Has asked to use phone.  Asked pt to wait until after 6am to make calls
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
## 2012-07-11 NOTE — BHH Counselor (Signed)
Spoke with Brett Canales @ [facility]. Confirmed pt is on the wait list and nothing needed at this time.

## 2012-07-11 NOTE — ED Notes (Signed)
Lunch tray ordered per pt request; cheeseburger and french fries.

## 2012-07-12 NOTE — BHH Counselor (Signed)
Pt stated that he was no longer SI and hearing voices; cm explained to pt that Hospital Staff take all words and actions seriously and that because he was under IVC paperwork that we would not be able to discharge him today; cm explained to pt that he changed his statement in less than 30 minutes; pt stated that he still was SI and was intending to cut his wrists and stated that he heard voices telling him to kill himself because he did not deserve to live; Pt stated that he changed his mind; Cm explained with [employer] that he would not be able to leave today and that tomorrow his situation could be reassessed.. Pt asked questions about what would happen if he walked out; [employer] answered his questions and explained that he needed to calm down and be reassessed tomorrow

## 2012-07-12 NOTE — ED Notes (Signed)
Pt resting in bed with sitter at bedside.  No wants or needs at this time per pt

## 2012-07-12 NOTE — BH Assessment (Signed)
BHH Assessment Progress Note      Spoke with Tinnie Gens who confirmed that Fred Wilson is on the wait list at [facility]. [phone])
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
## 2012-07-13 NOTE — ED Notes (Signed)
Paperwork faxed to [facility]

## 2012-07-13 NOTE — ED Provider Notes (Addendum)
He says that he is feeling well and is no longer suicidal. He says that Ativan helps him a great deal and he feels that if he can get a prescription for Ativan that he could followup with [facility]. Psychiatric consultation has been requested to see if he is safe to discharge.  Dione Booze, MD 07/13/12 1252  Psychiatry consultation and agrees that patient is no longer a threat to self or others. Patient states that he will followup with [facility] for while and he is discharged to  Dione Booze, MD 07/13/12 1526

## 2012-07-19 NOTE — BH Assessment (Signed)
BHH Assessment Progress Note      Got a call from [place] at [facility] questioning whether Fred Wilson was still waiting for a bed.  I told her that he was no longer in our system and she requested that I check his record to confirm that he wasn't pending a bed elsewhere in the hospital.  Confirmed he no longer needed placement and he was removed from the [facility] wait list.

## 2012-09-01 ENCOUNTER — Emergency Department (HOSPITAL_COMMUNITY)
Admission: EM | Admit: 2012-09-01 | Discharge: 2012-09-03 | Disposition: A | Discharge status: Home / Self Care | Payer: Self-pay | Attending: Emergency Medicine | Admitting: Emergency Medicine

## 2012-09-01 ENCOUNTER — Encounter (HOSPITAL_COMMUNITY): Payer: Self-pay | Admitting: *Deleted

## 2012-09-01 DIAGNOSIS — Z8709 Personal history of other diseases of the respiratory system: Secondary | ICD-10-CM | POA: Insufficient documentation

## 2012-09-01 DIAGNOSIS — F17200 Nicotine dependence, unspecified, uncomplicated: Secondary | ICD-10-CM | POA: Insufficient documentation

## 2012-09-01 DIAGNOSIS — F209 Schizophrenia, unspecified: Secondary | ICD-10-CM | POA: Insufficient documentation

## 2012-09-01 DIAGNOSIS — Z8659 Personal history of other mental and behavioral disorders: Secondary | ICD-10-CM | POA: Insufficient documentation

## 2012-09-01 DIAGNOSIS — Z8719 Personal history of other diseases of the digestive system: Secondary | ICD-10-CM | POA: Insufficient documentation

## 2012-09-01 DIAGNOSIS — R45851 Suicidal ideations: Secondary | ICD-10-CM | POA: Insufficient documentation

## 2012-09-01 DIAGNOSIS — F1410 Cocaine abuse, uncomplicated: Secondary | ICD-10-CM | POA: Insufficient documentation

## 2012-09-01 DIAGNOSIS — F3130 Bipolar disorder, current episode depressed, mild or moderate severity, unspecified: Secondary | ICD-10-CM | POA: Insufficient documentation

## 2012-09-01 DIAGNOSIS — Z8739 Personal history of other diseases of the musculoskeletal system and connective tissue: Secondary | ICD-10-CM | POA: Insufficient documentation

## 2012-09-01 DIAGNOSIS — F1210 Cannabis abuse, uncomplicated: Secondary | ICD-10-CM | POA: Insufficient documentation

## 2012-09-01 DIAGNOSIS — T1491XA Suicide attempt, initial encounter: Secondary | ICD-10-CM | POA: Insufficient documentation

## 2012-09-01 DIAGNOSIS — F1910 Other psychoactive substance abuse, uncomplicated: Secondary | ICD-10-CM | POA: Insufficient documentation

## 2012-09-01 DIAGNOSIS — F329 Major depressive disorder, single episode, unspecified: Secondary | ICD-10-CM

## 2012-09-01 HISTORY — DX: Schizophrenia, unspecified: F20.9

## 2012-09-01 HISTORY — DX: Suicide attempt, initial encounter: T14.91XA

## 2012-09-01 HISTORY — DX: Other psychoactive substance abuse, uncomplicated: F19.10

## 2012-09-01 LAB — CBC WITH DIFFERENTIAL/PLATELET
Basophils Relative: 0 % (ref 0–1)
Eosinophils Relative: 2 % (ref 0–5)
HCT: 42 % (ref 39.0–52.0)
Hemoglobin: 14.6 g/dL (ref 13.0–17.0)
Lymphocytes Relative: 21 % (ref 12–46)
MCHC: 34.8 g/dL (ref 30.0–36.0)
MCV: 88.8 fL (ref 78.0–100.0)
Monocytes Absolute: 0.5 10*3/uL (ref 0.1–1.0)
Monocytes Relative: 5 % (ref 3–12)
Neutro Abs: 7 10*3/uL (ref 1.7–7.7)

## 2012-09-01 LAB — COMPREHENSIVE METABOLIC PANEL
BUN: 9 mg/dL (ref 6–23)
CO2: 26 mEq/L (ref 19–32)
Chloride: 104 mEq/L (ref 96–112)
Creatinine, Ser: 0.91 mg/dL (ref 0.50–1.35)
GFR calc non Af Amer: >90 mL/min (ref >90)
Total Bilirubin: 0.2 mg/dL — ABNORMAL LOW (ref 0.3–1.2)

## 2012-09-01 LAB — RAPID URINE DRUG SCREEN, HOSP PERFORMED
Cocaine: NOT DETECTED
Opiates: NOT DETECTED

## 2012-09-01 MED ORDER — ACETAMINOPHEN 325 MG PO TABS: 650.0000 mg | ORAL_TABLET | ORAL | Status: DC | PRN

## 2012-09-01 MED ORDER — NICOTINE 21 MG/24HR TD PT24
21.0000 mg | MEDICATED_PATCH | Freq: Every day | TRANSDERMAL | Status: DC
  Administered 2012-09-01: 21 mg via TRANSDERMAL
  Filled 2012-09-01 (×2): qty 1

## 2012-09-01 MED ORDER — ALUM & MAG HYDROXIDE-SIMETH 200-200-20 MG/5ML PO SUSP: 30.0000 mL | ORAL | Status: DC | PRN

## 2012-09-01 MED ORDER — LORAZEPAM 1 MG PO TABS
1.0000 mg | ORAL_TABLET | Freq: Three times a day (TID) | ORAL | Status: DC | PRN
  Administered 2012-09-01: 1 mg via ORAL
  Filled 2012-09-01: qty 1

## 2012-09-01 NOTE — ED Notes (Signed)
PT AT DESK CURSING AND ON THE PHONE ABOUT HIS ATIVAN BEING DISCONTINUED. HE HAS CALLED HIS MOTHER TO COMPLAIN. HIS MOTHER IS WANTING THE PSYCHIATRIST TO CALL HER PHONE SO SHE CAN ASK TO GET PT MEDICATIONS.

## 2012-09-01 NOTE — ED Notes (Signed)
CPS report made on behalf of child to [facility]. DSS.

## 2012-09-01 NOTE — ED Notes (Signed)
Diet tray ordered 

## 2012-09-01 NOTE — ED Notes (Signed)
PATIENT REQUESTING ATIVAN AGAIN, AFTER BEING INFORMED THAT IT IS EVERY 8 HOURS. PT NOW INFORMED THAT THE PSYCHIATRIST HAS DISCONTINUED HIS ATIVAN. HE IS VERY ANGRY AND CURSING ABOUT THE CHANGE. STATES HE TOLD THE PSYCHIATRIST HE WAS AGITATED AND ANGRY AND HE CANT BELIEVE SHE WOULD TAKE AWAY HIS MEDICATION. ACT TEAM AS WELL AS DR KNAPP MADE AWARE OF PT ANGER

## 2012-09-01 NOTE — ED Notes (Signed)
Pt made last phone call- family coming in morning to visit. Also given shower supplies.

## 2012-09-01 NOTE — ED Notes (Signed)
TELEPSYCH IN PROGRESS 

## 2012-09-01 NOTE — ED Notes (Signed)
PT IS NOW ON THE PHONE TALKING TO A MALE ABOUT MONEY OWED ETC. AND A 50 B AND "CHARGES".

## 2012-09-01 NOTE — ED Notes (Signed)
Pt already in paper scrubs. Belongings removed from room, labeled & placed at nurses desk. House coverage notified of need for sitter. Security called to wand pt.

## 2012-09-01 NOTE — ED Notes (Signed)
Report received, assumed care.  

## 2012-09-01 NOTE — ED Notes (Signed)
telepsych papers faxed. Pt also found to have a court date pending on march 12.. This information has been faxed to psychiatrist so he can address this with the patient. Act team and physician aware. Pt has denied the court date every time it was addressed with him by this nurse and the act team.

## 2012-09-01 NOTE — ED Provider Notes (Signed)
History    CSN: [id number] Arrival date & time 09/01/12  4098 First MD Initiated Contact with Patient 09/01/12 [phone]     Chief Complaint  Patient presents with  . Suicidal   HPI Patient presents to emergency room with complaints of depression and thoughts of self-harm. Patient has had issues with depression for a long period of time. Recently, He was seen in the emergency room approximately one month ago. He was eventually released from the emergency department without inpatient hospitalization. Patient was supposed to have close outpatient followup. He he states he tried to get in with the outpatient center a few times but was unable to do so. He became frustrated and stopped trying to get in. His symptoms have progressed since that time. He feels that no one cares about him. He feels hopeless. Patient does not work or go to school. His fianc supports him. She is here with him and is supportive. Patient has been using drugs and alcohol. He does not have any specific plan but in the past he has cut himself. Past Medical History  Diagnosis Date  . Arthritis   . GERD (gastroesophageal reflux disease)   . Bronchitis   . Seasonal allergies   . ADHD (attention deficit hyperactivity disorder)   . Bipolar 1 disorder   . Schizophrenia   . Suicide attempt   . Substance abuse     Past Surgical History  Procedure Laterality Date  . Fracture surgery    . Knee surgery      No family history on file.  History  Substance Use Topics  . Smoking status: Current Every Day Smoker -- 1 years    Types: Cigarettes  . Smokeless tobacco: Not on file  . Alcohol Use: No      Review of Systems  All other systems reviewed and are negative.    Allergies  Bee venom; Dilaudid; Aspirin; Ibuprofen; Morphine and related; Toradol; and Tramadol  Home Medications  No current outpatient prescriptions on file.  BP 151/81  Pulse 72  Temp(Src) 97.4 F (36.3 C) (Oral)  Resp 18  SpO2 99%  Physical  Exam  Nursing note and vitals reviewed. Constitutional: He appears well-developed and well-nourished. No distress.  HENT:  Head: Normocephalic and atraumatic.  Right Ear: External ear normal.  Left Ear: External ear normal.  Eyes: Conjunctivae are normal. Right eye exhibits no discharge. Left eye exhibits no discharge. No scleral icterus.  Neck: Neck supple. No tracheal deviation present.  Cardiovascular: Normal rate, regular rhythm and intact distal pulses.   Pulmonary/Chest: Effort normal and breath sounds normal. No stridor. No respiratory distress. He has no wheezes. He has no rales.  Abdominal: Soft. Bowel sounds are normal. He exhibits no distension. There is no tenderness. There is no rebound and no guarding.  Musculoskeletal: He exhibits no edema and no tenderness.  Neurological: He is alert. He has normal strength. No sensory deficit. Cranial nerve deficit:  no gross defecits noted. He exhibits normal muscle tone. He displays no seizure activity. Coordination normal.  Skin: Skin is warm and dry. No rash noted.  Psychiatric: His affect is blunt. His affect is not labile. His speech is delayed. His speech is not rapid and/or pressured, not tangential and not slurred. He is withdrawn. He is not agitated and not aggressive. He exhibits a depressed mood. He expresses suicidal ideation.    ED Course  Procedures (including critical care time)  Labs Reviewed  COMPREHENSIVE METABOLIC PANEL - Abnormal; Notable for the following:  AST 65 (*)    Total Bilirubin 0.2 (*)    All other components within normal limits  CBC WITH DIFFERENTIAL  ETHANOL  URINE RAPID DRUG SCREEN (HOSP PERFORMED)   No results found.    MDM  Pt is medically stable.  I will consult with the ACT team for further psychiatric treatment.  Pt has been gradually worsening and is feeling depressed and suicidal.  No specific plan.      2:02 PM patient was evaluated by the psychiatrist. Inpatient treatment was  recommended. Discontinuing his Ativan was also recommended.  Celene Kras, MD 09/01/12 [phone]

## 2012-09-01 NOTE — ED Notes (Signed)
[name], ACT Team at bedside

## 2012-09-01 NOTE — ED Notes (Signed)
Pt states he plans to kill himself either by knife or placing a plastic bag over his head. He feels like knowing cares about him. This feeling stems from his mother and father. Lost his grandfather 4 years ago, who looked after him.
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
## 2012-09-01 NOTE — BH Assessment (Signed)
Assessment Note  Update:  Pt received telepsych and inpatient treatment recommended, as pt continues to make suicidal threats.  Updated assessment disposition, completed assessment notification, and [facility]  Updated ED staff.    Disposition:  Pending [facility]  On Site Evaluation by:   Reviewed with Physician:  Pat Patrick, Rennis Harding 09/01/2012 2:44 PM
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
## 2012-09-01 NOTE — ED Notes (Signed)
CALLED THE SOCIAL WORKER JESSIE SCINTO [phone] AND MADE HER AWARE THAT THE PSYCHIATRIST REQUESTS THAT CHILD PROTECTIVE SERVICES BE CALLED DUE TO PT ADMITTING COCAINE AND MARIJUANA USE AT HOME. PT LIVES WITH A 55 MONTH OLD CHILD IN THE HOME. SHE ADVISES THAT SHE HAS A MEETING AT 2 AND WILL BE DOWN AFTER HER MEETING
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
## 2012-09-02 NOTE — BH Assessment (Addendum)
[facility] Assessment Progress Note      Update:  Called [facility] in reference to pt disposition.  Had to leave message @ 1234.  [facility] @ 1410, no beds per Darlene.  Called [facility] and possible beds per Ray @ 1413.  He stated they never received the referral last night although there was a fax confirmation.  Called [facility] and completed prescreen with [facility] @ 1429.  Pt reviewed again with her @ 1541.  Claris Gladden called back @ [phone] and stated he was declined there because of the SA patients currently on their unit.  Called [facility] and had to leave message @ 1429.

## 2012-09-02 NOTE — ED Notes (Signed)
Pt denies SI/HI/AVH.  Pt rates his depression as 9 and his hopelessness as 6.  Pt states the he wants to do better for his children.  Pt also states that he lost his male role model 4 years ago, which was his grandfather.  He states that since the death of his grandfather he has not been the same.

## 2012-09-03 MED ORDER — LORAZEPAM 1 MG PO TABS: 1.0000 mg | ORAL_TABLET | Freq: Four times a day (QID) | ORAL | Status: DC | PRN

## 2012-09-03 MED ORDER — LORAZEPAM 2 MG/ML IJ SOLN: 1.0000 mg | Freq: Four times a day (QID) | INTRAMUSCULAR | Status: DC | PRN

## 2012-09-03 MED ORDER — LORAZEPAM 1 MG PO TABS: 0.0000 mg | ORAL_TABLET | Freq: Four times a day (QID) | ORAL | Status: DC

## 2012-09-03 MED ORDER — LORAZEPAM 1 MG PO TABS: 0.0000 mg | ORAL_TABLET | Freq: Two times a day (BID) | ORAL | Status: DC

## 2012-09-03 NOTE — ED Notes (Signed)
TELEPSYCH IN PROGRESS 

## 2012-09-03 NOTE — ED Provider Notes (Addendum)
Patient this morning is denying suicidal ideation or homicidal ideation. He states he's been off all meds for the last 10 years and he does admit to using multiple substances. However he states because his baby his mother is now refusing to let him see his child until he gets help he came in. He states the suicidal ideations have resolved since he has been in the hospital but he does feel that he needs medication for depression. He has been here since 09/01/2012 and initially had a telephone at recommended inpatient hospitalization but also rule out malingering, drug abuse. Patient has received no medications since arrival here and has received no benzodiazepines. He has had no withdrawal symptoms and is talking calmly and is cooperative.  Pt does have multiple warrants out for his arrest and apparently a court date today.   However do not feel pt is a harm to self or others.  Will re-telepsych for evaluation: recommendations for depression meds and possible d/c.  10:52 AM Pt seen by telepsych who felt pt could be d/ced home.  Not a threat to himself or others.  Also wanted to wait on any meds due to substances causing issues.  Want him to be clean for 30 days and then if having depression sx can start something then.  Pt given resources to [employer].  Gwyneth Sprout, MD 09/03/12 1610  Gwyneth Sprout, MD 09/03/12 1053

## 2012-09-03 NOTE — ED Notes (Signed)
PT REQUESTS AN ANTIDEPRESSANT. STATES THAT HE DOESN'T THINK HE WAS REALLY SUICIDAL, BUT THAT HE WAS REALLY DEPRESSED. STATES HE HAS NOT BEEN PUT ON AN ANTIDEPRESSANT BEFORE. DR Anitra Lauth HAS ORDERED A REPEAT TELEPSYCH FOR PT AND HE IS AGREEABLE.

## 2012-09-17 ENCOUNTER — Encounter (HOSPITAL_COMMUNITY): Payer: Self-pay | Admitting: Family Medicine

## 2012-09-17 ENCOUNTER — Emergency Department (HOSPITAL_COMMUNITY)
Admission: EM | Admit: 2012-09-17 | Discharge: 2012-09-17 | Disposition: A | Discharge status: Home / Self Care | Payer: Self-pay | Attending: Emergency Medicine | Admitting: Emergency Medicine

## 2012-09-17 DIAGNOSIS — F17200 Nicotine dependence, unspecified, uncomplicated: Secondary | ICD-10-CM | POA: Insufficient documentation

## 2012-09-17 DIAGNOSIS — Z8709 Personal history of other diseases of the respiratory system: Secondary | ICD-10-CM | POA: Insufficient documentation

## 2012-09-17 DIAGNOSIS — Z8739 Personal history of other diseases of the musculoskeletal system and connective tissue: Secondary | ICD-10-CM | POA: Insufficient documentation

## 2012-09-17 DIAGNOSIS — A088 Other specified intestinal infections: Secondary | ICD-10-CM | POA: Insufficient documentation

## 2012-09-17 DIAGNOSIS — R197 Diarrhea, unspecified: Secondary | ICD-10-CM | POA: Insufficient documentation

## 2012-09-17 DIAGNOSIS — R1013 Epigastric pain: Secondary | ICD-10-CM | POA: Insufficient documentation

## 2012-09-17 DIAGNOSIS — R51 Headache: Secondary | ICD-10-CM | POA: Insufficient documentation

## 2012-09-17 DIAGNOSIS — Z8659 Personal history of other mental and behavioral disorders: Secondary | ICD-10-CM | POA: Insufficient documentation

## 2012-09-17 DIAGNOSIS — A084 Viral intestinal infection, unspecified: Secondary | ICD-10-CM

## 2012-09-17 DIAGNOSIS — Z8719 Personal history of other diseases of the digestive system: Secondary | ICD-10-CM | POA: Insufficient documentation

## 2012-09-17 LAB — URINALYSIS, ROUTINE W REFLEX MICROSCOPIC
Bilirubin Urine: NEGATIVE
Glucose, UA: NEGATIVE mg/dL
Hgb urine dipstick: NEGATIVE
Ketones, ur: NEGATIVE mg/dL
Protein, ur: NEGATIVE mg/dL

## 2012-09-17 LAB — CBC WITH DIFFERENTIAL/PLATELET
Basophils Absolute: 0 10*3/uL (ref 0.0–0.1)
HCT: 37.9 % — ABNORMAL LOW (ref 39.0–52.0)
Lymphocytes Relative: 15 % (ref 12–46)
Lymphs Abs: 1.1 10*3/uL (ref 0.7–4.0)
Monocytes Absolute: 0.5 10*3/uL (ref 0.1–1.0)
Neutro Abs: 5.9 10*3/uL (ref 1.7–7.7)
Platelets: 217 10*3/uL (ref 150–400)
RBC: 4.3 MIL/uL (ref 4.22–5.81)
RDW: 13.3 % (ref 11.5–15.5)
WBC: 7.5 10*3/uL (ref 4.0–10.5)

## 2012-09-17 LAB — COMPREHENSIVE METABOLIC PANEL
ALT: 12 U/L (ref 0–53)
AST: 24 U/L (ref 0–37)
Alkaline Phosphatase: 74 U/L (ref 39–117)
CO2: 22 mEq/L (ref 19–32)
Chloride: 103 mEq/L (ref 96–112)
GFR calc non Af Amer: >90 mL/min (ref >90)
Sodium: 137 mEq/L (ref 135–145)
Total Bilirubin: 0.3 mg/dL (ref 0.3–1.2)

## 2012-09-17 LAB — URINE MICROSCOPIC-ADD ON

## 2012-09-17 MED ORDER — SODIUM CHLORIDE 0.9 % IV BOLUS (SEPSIS)
1000.0000 mL | Freq: Once | INTRAVENOUS | Status: AC
  Administered 2012-09-17: 1000 mL via INTRAVENOUS

## 2012-09-17 MED ORDER — ONDANSETRON HCL 4 MG/2ML IJ SOLN
4.0000 mg | Freq: Once | INTRAMUSCULAR | Status: AC
  Administered 2012-09-17: 4 mg via INTRAVENOUS
  Filled 2012-09-17: qty 2

## 2012-09-17 MED ORDER — PROMETHAZINE HCL 25 MG PO TABS: 25.0000 mg | ORAL_TABLET | Freq: Four times a day (QID) | ORAL | Status: DC | PRN

## 2012-09-17 MED ORDER — DICYCLOMINE HCL 20 MG PO TABS: 20.0000 mg | ORAL_TABLET | Freq: Two times a day (BID) | ORAL | Status: DC

## 2012-09-17 MED ORDER — ACETAMINOPHEN 500 MG PO TABS
1000.0000 mg | ORAL_TABLET | Freq: Once | ORAL | Status: AC
  Administered 2012-09-17: 1000 mg via ORAL
  Filled 2012-09-17: qty 2

## 2012-09-17 MED ORDER — PROMETHAZINE HCL 25 MG/ML IJ SOLN
25.0000 mg | Freq: Once | INTRAMUSCULAR | Status: DC
  Filled 2012-09-17: qty 1

## 2012-09-17 NOTE — ED Provider Notes (Signed)
History     CSN: [id number]  Arrival date & time 09/17/12  4098   First MD Initiated Contact with Patient 09/17/12 [phone]      Chief Complaint  Patient presents with  . Nausea    (Consider location/radiation/quality/duration/timing/severity/associated sxs/prior treatment) HPI Comments: Patient is a 27 year old male with a past medical history of GERD, schizophrenia, and bipolar disorder who presents with a 4 day history of abdominal pain. The pain is located in the epigastrium and does not radiate. Patient reports nausea and vomiting preceded the abdominal pain. The pain is described as aching and severe. The pain started gradually and progressively worsened since the onset. No alleviating/aggravating factors. The patient has tried tylenol for symptoms without relief. Associated symptoms include nausea, vomiting, and headache. Patient denies fever, headache, diarrhea, chest pain, SOB, dysuria, constipation. Patient reports he is able to pass gas and have bowel movements. No known sick contacts.    Past Medical History  Diagnosis Date  . Arthritis   . GERD (gastroesophageal reflux disease)   . Bronchitis   . Seasonal allergies   . ADHD (attention deficit hyperactivity disorder)   . Bipolar 1 disorder   . Schizophrenia   . Suicide attempt   . Substance abuse     Past Surgical History  Procedure Laterality Date  . Fracture surgery    . Knee surgery      History reviewed. No pertinent family history.  History  Substance Use Topics  . Smoking status: Current Every Day Smoker -- 1 years    Types: Cigarettes  . Smokeless tobacco: Not on file  . Alcohol Use: No      Review of Systems  Gastrointestinal: Positive for nausea, vomiting, abdominal pain and diarrhea.  Neurological: Positive for headaches.  All other systems reviewed and are negative.    Allergies  Bee venom; Dilaudid; Aspirin; Ibuprofen; Morphine and related; Toradol; and Tramadol  Home Medications    Current Outpatient Rx  Name  Route  Sig  Dispense  Refill  . acetaminophen (TYLENOL) 500 MG tablet   Oral   Take 1,000 mg by mouth every 6 (six) hours as needed for pain.           BP 143/93  Pulse 106  Temp(Src) 98.9 F (37.2 C) (Oral)  Resp 18  SpO2 97%  Physical Exam  Nursing note and vitals reviewed. Constitutional: He is oriented to person, place, and time. He appears well-developed and well-nourished. No distress.  HENT:  Head: Normocephalic and atraumatic.  Eyes: Conjunctivae and EOM are normal. Pupils are equal, round, and reactive to light. No scleral icterus.  Neck: Normal range of motion. Neck supple.  Cardiovascular: Normal rate and regular rhythm.  Exam reveals no gallop and no friction rub.   No murmur heard. Pulmonary/Chest: Effort normal and breath sounds normal. He has no wheezes. He has no rales. He exhibits no tenderness.  Abdominal: Soft. He exhibits no distension. There is tenderness. There is no rebound.  Epigastric tenderness to palpation. No peritoneal signs.   Musculoskeletal: Normal range of motion.  Neurological: He is alert and oriented to person, place, and time. Coordination normal.  Speech is goal-oriented. Moves limbs without ataxia.   Skin: Skin is warm and dry.  Psychiatric: He has a normal mood and affect. His behavior is normal.    ED Course  Procedures (including critical care time)  Labs Reviewed  CBC WITH DIFFERENTIAL - Abnormal; Notable for the following:    HCT 37.9 (*)  Neutrophils Relative 78 (*)    All other components within normal limits  COMPREHENSIVE METABOLIC PANEL - Abnormal; Notable for the following:    Glucose, Bld 100 (*)    All other components within normal limits  URINALYSIS, ROUTINE W REFLEX MICROSCOPIC - Abnormal; Notable for the following:    Leukocytes, UA SMALL (*)    All other components within normal limits  LIPASE, BLOOD  URINE MICROSCOPIC-ADD ON   No results found.   1. Viral  gastroenteritis       MDM  9:40 AM Labs pending. Patient given pain and nausea medication.   11:06 AM Labs unremarkable for any infectious etiology. Patient reports feeling better. Patient likely experiencing viral gastroenteritis. Patient will be discharged with symptomatic relief. Vitals stable and patient afebrile at this time. I will discharge him with Phenergan for nausea, pain medication, and bentyl. Patient instructed to return with worsening or concerning symptoms.   Emilia Beck, PA-C 09/17/12 1113

## 2012-09-17 NOTE — ED Notes (Signed)
Pt having N,V,D sick for 4 days intermittent. Abdominal pain 8/10. Had a HA and took tylenol with relief. sts able to hold down fluids. CBG 85. BP 140/88

## 2012-09-17 NOTE — ED Provider Notes (Signed)
Medical screening examination/treatment/procedure(s) were performed by non-physician practitioner and as supervising physician I was immediately available for consultation/collaboration.   Carleene Cooper III, MD 09/17/12 2212

## 2012-09-30 ENCOUNTER — Emergency Department (HOSPITAL_COMMUNITY): Payer: Self-pay

## 2012-09-30 ENCOUNTER — Encounter (HOSPITAL_COMMUNITY): Payer: Self-pay | Admitting: Emergency Medicine

## 2012-09-30 DIAGNOSIS — R509 Fever, unspecified: Secondary | ICD-10-CM | POA: Insufficient documentation

## 2012-09-30 DIAGNOSIS — Z8739 Personal history of other diseases of the musculoskeletal system and connective tissue: Secondary | ICD-10-CM | POA: Insufficient documentation

## 2012-09-30 DIAGNOSIS — R112 Nausea with vomiting, unspecified: Secondary | ICD-10-CM | POA: Insufficient documentation

## 2012-09-30 DIAGNOSIS — D72829 Elevated white blood cell count, unspecified: Secondary | ICD-10-CM | POA: Insufficient documentation

## 2012-09-30 DIAGNOSIS — Z8659 Personal history of other mental and behavioral disorders: Secondary | ICD-10-CM | POA: Insufficient documentation

## 2012-09-30 DIAGNOSIS — R1033 Periumbilical pain: Secondary | ICD-10-CM | POA: Insufficient documentation

## 2012-09-30 DIAGNOSIS — Z8719 Personal history of other diseases of the digestive system: Secondary | ICD-10-CM | POA: Insufficient documentation

## 2012-09-30 DIAGNOSIS — Z8679 Personal history of other diseases of the circulatory system: Secondary | ICD-10-CM | POA: Insufficient documentation

## 2012-09-30 DIAGNOSIS — I880 Nonspecific mesenteric lymphadenitis: Secondary | ICD-10-CM | POA: Insufficient documentation

## 2012-09-30 DIAGNOSIS — J020 Streptococcal pharyngitis: Secondary | ICD-10-CM | POA: Insufficient documentation

## 2012-09-30 DIAGNOSIS — F17200 Nicotine dependence, unspecified, uncomplicated: Secondary | ICD-10-CM | POA: Insufficient documentation

## 2012-09-30 LAB — COMPREHENSIVE METABOLIC PANEL
ALT: 16 U/L (ref 0–53)
AST: 20 U/L (ref 0–37)
Albumin: 3.8 g/dL (ref 3.5–5.2)
Alkaline Phosphatase: 85 U/L (ref 39–117)
Glucose, Bld: 115 mg/dL — ABNORMAL HIGH (ref 70–99)
Potassium: 3.4 mEq/L — ABNORMAL LOW (ref 3.5–5.1)
Sodium: 136 mEq/L (ref 135–145)
Total Protein: 7.3 g/dL (ref 6.0–8.3)

## 2012-09-30 LAB — CBC WITH DIFFERENTIAL/PLATELET
Basophils Absolute: 0 10*3/uL (ref 0.0–0.1)
HCT: 35.1 % — ABNORMAL LOW (ref 39.0–52.0)
Lymphocytes Relative: 7 % — ABNORMAL LOW (ref 12–46)
Monocytes Relative: 5 % (ref 3–12)
Neutro Abs: 18.5 10*3/uL — ABNORMAL HIGH (ref 1.7–7.7)
RDW: 13.5 % (ref 11.5–15.5)
WBC: 21.1 10*3/uL — ABNORMAL HIGH (ref 4.0–10.5)

## 2012-09-30 NOTE — ED Notes (Signed)
PT. REPORTS INTERMITTENT  EMESIS WITH DIARRHEA , HEADACHE , COUGH , GENERALIZED WEAKNESS AND  SORE THROAT FOR 3 WEEKS.

## 2012-10-01 ENCOUNTER — Emergency Department (HOSPITAL_COMMUNITY)
Admission: EM | Admit: 2012-10-01 | Discharge: 2012-10-01 | Disposition: A | Discharge status: Home / Self Care | Payer: Self-pay | Attending: Emergency Medicine | Admitting: Emergency Medicine

## 2012-10-01 ENCOUNTER — Emergency Department (HOSPITAL_COMMUNITY): Payer: Self-pay

## 2012-10-01 DIAGNOSIS — R109 Unspecified abdominal pain: Secondary | ICD-10-CM

## 2012-10-01 DIAGNOSIS — D72829 Elevated white blood cell count, unspecified: Secondary | ICD-10-CM

## 2012-10-01 DIAGNOSIS — J020 Streptococcal pharyngitis: Secondary | ICD-10-CM

## 2012-10-01 DIAGNOSIS — I880 Nonspecific mesenteric lymphadenitis: Secondary | ICD-10-CM

## 2012-10-01 DIAGNOSIS — R112 Nausea with vomiting, unspecified: Secondary | ICD-10-CM

## 2012-10-01 LAB — URINE MICROSCOPIC-ADD ON

## 2012-10-01 LAB — URINALYSIS, ROUTINE W REFLEX MICROSCOPIC
Glucose, UA: NEGATIVE mg/dL
Protein, ur: NEGATIVE mg/dL
pH: 6 (ref 5.0–8.0)

## 2012-10-01 MED ORDER — ACETAMINOPHEN 650 MG RE SUPP: 650.0000 mg | Freq: Once | RECTAL | Status: DC

## 2012-10-01 MED ORDER — IOHEXOL 300 MG/ML  SOLN
100.0000 mL | Freq: Once | INTRAMUSCULAR | Status: AC | PRN
  Administered 2012-10-01: 100 mL via INTRAVENOUS

## 2012-10-01 MED ORDER — METOCLOPRAMIDE HCL 10 MG PO TABS: 10.0000 mg | ORAL_TABLET | Freq: Four times a day (QID) | ORAL | Status: DC | PRN

## 2012-10-01 MED ORDER — DIPHENHYDRAMINE HCL 50 MG/ML IJ SOLN
25.0000 mg | Freq: Once | INTRAMUSCULAR | Status: AC
  Administered 2012-10-01: 25 mg via INTRAVENOUS
  Filled 2012-10-01: qty 1

## 2012-10-01 MED ORDER — SODIUM CHLORIDE 0.9 % IV SOLN
1000.0000 mL | INTRAVENOUS | Status: DC
  Administered 2012-10-01: 1000 mL via INTRAVENOUS

## 2012-10-01 MED ORDER — ACETAMINOPHEN 650 MG RE SUPP
650.0000 mg | Freq: Once | RECTAL | Status: AC
  Administered 2012-10-01: 650 mg via RECTAL
  Filled 2012-10-01: qty 1

## 2012-10-01 MED ORDER — PENICILLIN G BENZATHINE 1200000 UNIT/2ML IM SUSP
1.2000 10*6.[IU] | Freq: Once | INTRAMUSCULAR | Status: AC
  Administered 2012-10-01: 1.2 10*6.[IU] via INTRAMUSCULAR
  Filled 2012-10-01: qty 2

## 2012-10-01 MED ORDER — HYDROCODONE-ACETAMINOPHEN 5-325 MG PO TABS: 1.0000 | ORAL_TABLET | ORAL | Status: DC | PRN

## 2012-10-01 MED ORDER — IOHEXOL 300 MG/ML  SOLN
50.0000 mL | Freq: Once | INTRAMUSCULAR | Status: AC | PRN
  Administered 2012-10-01: 50 mL via ORAL

## 2012-10-01 MED ORDER — METOCLOPRAMIDE HCL 5 MG/ML IJ SOLN
10.0000 mg | Freq: Once | INTRAMUSCULAR | Status: AC
  Administered 2012-10-01: 10 mg via INTRAVENOUS
  Filled 2012-10-01: qty 2

## 2012-10-01 MED ORDER — SODIUM CHLORIDE 0.9 % IV SOLN
1000.0000 mL | Freq: Once | INTRAVENOUS | Status: AC
  Administered 2012-10-01: 1000 mL via INTRAVENOUS

## 2012-10-01 MED ORDER — DEXAMETHASONE SODIUM PHOSPHATE 10 MG/ML IJ SOLN
10.0000 mg | Freq: Once | INTRAMUSCULAR | Status: AC
  Administered 2012-10-01: 10 mg via INTRAVENOUS
  Filled 2012-10-01: qty 1

## 2012-10-01 MED ORDER — ONDANSETRON HCL 4 MG/2ML IJ SOLN
4.0000 mg | Freq: Once | INTRAMUSCULAR | Status: AC
  Administered 2012-10-01: 4 mg via INTRAVENOUS
  Filled 2012-10-01: qty 2

## 2012-10-01 NOTE — ED Provider Notes (Signed)
History     CSN: [id number]  Arrival date & time 09/30/12  2147   First MD Initiated Contact with Patient 10/01/12 0133      Chief Complaint  Patient presents with  . Emesis    (Consider location/radiation/quality/duration/timing/severity/associated sxs/prior treatment) Patient is a 27 y.o. male presenting with vomiting. The history is provided by the patient.  Emesis He has been sick with nausea and vomiting for approximately 3 weeks. He is seen in the emergency department and diagnosed with a stomach virus and given a prescription for Phenergan which has not helped. He continues to have nausea and vomiting. There is associated crampy periumbilical pain which has not radiated. Pain is as severe as 9/10. Nothing makes it better nothing makes it worse. He has had low-grade fevers at home but has not taken his temperature. He denies chills or sweats. There has been some intermittent diarrhea. Symptoms are not necessarily getting worse but just her not getting better.  Past Medical History  Diagnosis Date  . Arthritis   . GERD (gastroesophageal reflux disease)   . Bronchitis   . Seasonal allergies   . ADHD (attention deficit hyperactivity disorder)   . Bipolar 1 disorder   . Schizophrenia   . Suicide attempt   . Substance abuse     Past Surgical History  Procedure Laterality Date  . Fracture surgery    . Knee surgery      No family history on file.  History  Substance Use Topics  . Smoking status: Current Every Day Smoker -- 1 years    Types: Cigarettes  . Smokeless tobacco: Not on file  . Alcohol Use: No      Review of Systems  Gastrointestinal: Positive for vomiting.  All other systems reviewed and are negative.    Allergies  Bee venom; Dilaudid; Aspirin; Ibuprofen; Morphine and related; Toradol; and Tramadol  Home Medications   Current Outpatient Rx  Name  Route  Sig  Dispense  Refill  . acetaminophen (TYLENOL) 500 MG tablet   Oral   Take 1,000 mg by  mouth every 6 (six) hours as needed for pain.         Marland Kitchen dicyclomine (BENTYL) 20 MG tablet   Oral   Take 1 tablet (20 mg total) by mouth 2 (two) times daily.   20 tablet   0   . EXPIRED: promethazine (PHENERGAN) 25 MG tablet   Oral   Take 1 tablet (25 mg total) by mouth every 6 (six) hours as needed for nausea.   30 tablet   0   . promethazine (PHENERGAN) 25 MG tablet   Oral   Take 1 tablet (25 mg total) by mouth every 6 (six) hours as needed for nausea.   12 tablet   0     BP 133/77  Pulse 84  Temp(Src) 98.8 F (37.1 C) (Oral)  Resp 18  SpO2 97%  Physical Exam  Nursing note and vitals reviewed.  27 year old male, resting comfortably and in no acute distress. Vital signs are normal. Oxygen saturation is 97%, which is normal. Head is normocephalic and atraumatic. PERRLA, EOMI. Oropharynx is clear. Tonsils are mildly hypertrophic but without exudate or erythema. Neck is nontender and supple without adenopathy or JVD. Back is nontender and there is no CVA tenderness. Lungs are clear without rales, wheezes, or rhonchi. Chest is nontender. Heart has regular rate and rhythm without murmur. Abdomen is soft, flat, with moderate right lower quadrant tenderness. There is tenderness  to percussion but no clear rebound tenderness or guarding. There is no hepatosplenomegaly and peristalsis is hypoactive. Extremities have no cyanosis or edema, full range of motion is present. Skin is warm and dry without rash. Neurologic: Mental status is normal, cranial nerves are intact, there are no motor or sensory deficits.  ED Course  Procedures (including critical care time)  Results for orders placed during the hospital encounter of 10/01/12  RAPID STREP SCREEN      Result Value Range   Streptococcus, Group A Screen (Direct) POSITIVE (*) NEGATIVE  URINALYSIS, ROUTINE W REFLEX MICROSCOPIC      Result Value Range   Color, Urine YELLOW  YELLOW   APPearance CLOUDY (*) CLEAR   Specific  Gravity, Urine 1.033 (*) 1.005 - 1.030   pH 6.0  5.0 - 8.0   Glucose, UA NEGATIVE  NEGATIVE mg/dL   Hgb urine dipstick NEGATIVE  NEGATIVE   Bilirubin Urine SMALL (*) NEGATIVE   Ketones, ur 15 (*) NEGATIVE mg/dL   Protein, ur NEGATIVE  NEGATIVE mg/dL   Urobilinogen, UA 1.0  0.0 - 1.0 mg/dL   Nitrite NEGATIVE  NEGATIVE   Leukocytes, UA MODERATE (*) NEGATIVE  CBC WITH DIFFERENTIAL      Result Value Range   WBC 21.1 (*) 4.0 - 10.5 K/uL   RBC 4.11 (*) 4.22 - 5.81 MIL/uL   Hemoglobin 12.7 (*) 13.0 - 17.0 g/dL   HCT 16.1 (*) 09.6 - 04.5 %   MCV 85.4  78.0 - 100.0 fL   MCH 30.9  26.0 - 34.0 pg   MCHC 36.2 (*) 30.0 - 36.0 g/dL   RDW 40.9  81.1 - 91.4 %   Platelets 310  150 - 400 K/uL   Neutrophils Relative 88 (*) 43 - 77 %   Lymphocytes Relative 7 (*) 12 - 46 %   Monocytes Relative 5  3 - 12 %   Eosinophils Relative 0  0 - 5 %   Basophils Relative 0  0 - 1 %   Neutro Abs 18.5 (*) 1.7 - 7.7 K/uL   Lymphs Abs 1.5  0.7 - 4.0 K/uL   Monocytes Absolute 1.1 (*) 0.1 - 1.0 K/uL   Eosinophils Absolute 0.0  0.0 - 0.7 K/uL   Basophils Absolute 0.0  0.0 - 0.1 K/uL   WBC Morphology WHITE COUNT CONFIRMED ON SMEAR     Smear Review MORPHOLOGY UNREMARKABLE    COMPREHENSIVE METABOLIC PANEL      Result Value Range   Sodium 136  135 - 145 mEq/L   Potassium 3.4 (*) 3.5 - 5.1 mEq/L   Chloride 101  96 - 112 mEq/L   CO2 25  19 - 32 mEq/L   Glucose, Bld 115 (*) 70 - 99 mg/dL   BUN 6  6 - 23 mg/dL   Creatinine, Ser 7.82  0.50 - 1.35 mg/dL   Calcium 9.5  8.4 - 95.6 mg/dL   Total Protein 7.3  6.0 - 8.3 g/dL   Albumin 3.8  3.5 - 5.2 g/dL   AST 20  0 - 37 U/L   ALT 16  0 - 53 U/L   Alkaline Phosphatase 85  39 - 117 U/L   Total Bilirubin 0.3  0.3 - 1.2 mg/dL   GFR calc non Af Amer >90  >90 mL/min   GFR calc Af Amer >90  >90 mL/min  URINE MICROSCOPIC-ADD ON      Result Value Range   Squamous Epithelial / LPF FEW (*) RARE  WBC, UA 21-50  <3 WBC/hpf   RBC / HPF 0-2  <3 RBC/hpf   Bacteria, UA FEW (*)  RARE   Urine-Other MUCOUS PRESENT     Dg Chest 2 View  09/30/2012  *RADIOLOGY REPORT*  Clinical Data: Cough.  Emesis.  CHEST - 2 VIEW  Comparison: Two-view chest 04/21/2012.  Findings: The heart size is normal.  The lungs are clear.  The visualized soft tissues and bony thorax are unremarkable.  IMPRESSION: Negative two-view chest.   Original Report Authenticated By: Marin Roberts, M.D.    Ct Abdomen Pelvis W Contrast  10/01/2012  *RADIOLOGY REPORT*  Clinical Data: Nausea, vomiting, diarrhea and sore throat. Headache.  CT ABDOMEN AND PELVIS WITH CONTRAST  Technique:  Multidetector CT imaging of the abdomen and pelvis was performed following the standard protocol during bolus administration of intravenous contrast.  Contrast: OMNIPAQUE IOHEXOL 300 MG/ML  SOLN  Comparison: CT of the abdomen and pelvis performed 12/12/2007, and abdominal radiograph performed 08/23/2011  Findings: The visualized lung bases are clear.  The liver and spleen are unremarkable in appearance.  Apparent trace fluid about the gallbladder is nonspecific; no significant associated soft tissue inflammation is seen.  The pancreas and adrenal glands are unremarkable.  The kidneys are unremarkable in appearance.  There is no evidence of hydronephrosis.  No renal or ureteral stones are seen.  No perinephric stranding is appreciated.  No free fluid is identified.  The small bowel is unremarkable in appearance.  The stomach is within normal limits.  No acute vascular abnormalities are seen.  There is diffuse prominence of mesenteric nodes, of uncertain significance.  Mildly prominent paracaval nodes are also seen.  The appendix is normal in caliber and contains air, better characterized on coronal images.  There is no evidence for appendicitis.  The colon is unremarkable in appearance.  The bladder is mildly distended and grossly unremarkable in appearance.  The prostate remains normal in size.  No inguinal lymphadenopathy is seen.  No  acute osseous abnormalities are identified.  IMPRESSION:  1.  Diffuse prominence of mesenteric nodes, of uncertain significance; mildly prominent paracaval nodes seen.  This could reflect mild mesenteric adenitis. 2.  Apparent trace fluid about the gallbladder is nonspecific; the gallbladder is otherwise unremarkable in appearance.   Original Report Authenticated By: Tonia Ghent, M.D.     Images viewed by me.   1. Strep pharyngitis   2. Abdominal pain   3. Leukocytosis   4. Mesenteric adenitis   5. Nausea & vomiting       MDM  Abdominal pain with nausea which is worrisome for appendicitis. WBC is come back markedly elevated at 21.1 with a left shift. Urinalysis shows pyuria but that can be seen with appendicitis. He will be sent for CT of his abdomen. Unfortunately, he is allergic to morphine and Dilaudid so no parenteral analgesics can be given. He has been given ondansetron for nausea with only slight relief. He is given metoclopramide for nausea. Old records are reviewed and he was seen 2 weeks ago with diagnosis of gastroenteritis and discharged with prescriptions for promethazine and dicyclomine.  Following IV hydration and IV metoclopramide, he is feeling much better. In fact, he states he is hungry and is asking for something to eat. CT does not show evidence of appendicitis. Enlarged mesenteric lymph nodes is consistent with mesenteric adenitis. Strep screen had been obtained at triage and has come back positive it may also be a significant contributing factor. He was given  options of oral penicillin versus intramuscular penicillin and has elected to get intramuscular penicillin. He is given an injection of Bicillin. He is sent home with prescriptions for Norco and metoclopramide. He is instructed to return should symptoms worsen.  Dione Booze, MD 10/01/12 [phone]

## 2012-10-01 NOTE — ED Notes (Signed)
Pt and family very impatient /.

## 2012-10-01 NOTE — ED Notes (Signed)
1000cc nss added to saline lok.  Med given.  Food given per request

## 2012-10-01 NOTE — ED Notes (Signed)
Pt in c-t.  Family waqiting

## 2012-10-01 NOTE — ED Notes (Signed)
Pt reports nausea, vomiting, diarrhea, sore throat, headache for 3 weeks. States that he was in the ED 3 weeks ago for same complaint, took meds as prescribed with no relief. Pt's throat inflamed with white patches in back of throat. Rates headache 10/10. States all he can take is tylenol. Pt alert and oriented x 4, neuro intact.

## 2012-10-01 NOTE — ED Notes (Signed)
The pt is alert he just returned from c-t. And reports that he feels ok.

## 2012-10-01 NOTE — ED Notes (Signed)
im penicillin given will need to wait for 20 minutes in case of a reaction

## 2012-10-02 LAB — URINE CULTURE

## 2014-01-15 ENCOUNTER — Emergency Department (HOSPITAL_COMMUNITY): Payer: Self-pay

## 2014-01-15 ENCOUNTER — Encounter (HOSPITAL_COMMUNITY): Payer: Self-pay | Admitting: Emergency Medicine

## 2014-01-15 ENCOUNTER — Emergency Department (HOSPITAL_COMMUNITY)
Admission: EM | Admit: 2014-01-15 | Discharge: 2014-01-15 | Disposition: A | Discharge status: Home / Self Care | Payer: Self-pay | Attending: Emergency Medicine | Admitting: Emergency Medicine

## 2014-01-15 DIAGNOSIS — R55 Syncope and collapse: Secondary | ICD-10-CM | POA: Insufficient documentation

## 2014-01-15 DIAGNOSIS — F17200 Nicotine dependence, unspecified, uncomplicated: Secondary | ICD-10-CM | POA: Insufficient documentation

## 2014-01-15 DIAGNOSIS — Z8719 Personal history of other diseases of the digestive system: Secondary | ICD-10-CM | POA: Insufficient documentation

## 2014-01-15 DIAGNOSIS — Z8739 Personal history of other diseases of the musculoskeletal system and connective tissue: Secondary | ICD-10-CM | POA: Insufficient documentation

## 2014-01-15 DIAGNOSIS — Z8659 Personal history of other mental and behavioral disorders: Secondary | ICD-10-CM | POA: Insufficient documentation

## 2014-01-15 DIAGNOSIS — J45909 Unspecified asthma, uncomplicated: Secondary | ICD-10-CM | POA: Insufficient documentation

## 2014-01-15 DIAGNOSIS — R079 Chest pain, unspecified: Secondary | ICD-10-CM | POA: Insufficient documentation

## 2014-01-15 HISTORY — DX: Unspecified asthma, uncomplicated: J45.909

## 2014-01-15 LAB — I-STAT TROPONIN, ED: Troponin i, poc: 0.01 ng/mL (ref 0.00–0.08)

## 2014-01-15 LAB — CBC
HEMATOCRIT: 40.6 % (ref 39.0–52.0)
Hemoglobin: 13.8 g/dL (ref 13.0–17.0)
MCH: 30.3 pg (ref 26.0–34.0)
MCHC: 34 g/dL (ref 30.0–36.0)
MCV: 89.2 fL (ref 78.0–100.0)
Platelets: 259 10*3/uL (ref 150–400)
RBC: 4.55 MIL/uL (ref 4.22–5.81)
RDW: 13.4 % (ref 11.5–15.5)
WBC: 13.9 10*3/uL — AB (ref 4.0–10.5)

## 2014-01-15 LAB — BASIC METABOLIC PANEL
Anion gap: 13 (ref 5–15)
BUN: 13 mg/dL (ref 6–23)
CO2: 25 mEq/L (ref 19–32)
CREATININE: 0.8 mg/dL (ref 0.50–1.35)
Calcium: 9.2 mg/dL (ref 8.4–10.5)
Chloride: 105 mEq/L (ref 96–112)
GFR calc Af Amer: >90 mL/min (ref >90)
GFR calc non Af Amer: >90 mL/min (ref >90)
GLUCOSE: 113 mg/dL — AB (ref 70–99)
Potassium: 4.7 mEq/L (ref 3.7–5.3)
Sodium: 143 mEq/L (ref 137–147)

## 2014-01-15 MED ORDER — ALBUTEROL SULFATE HFA 108 (90 BASE) MCG/ACT IN AERS
2.0000 | INHALATION_SPRAY | Freq: Once | RESPIRATORY_TRACT | Status: AC
  Administered 2014-01-15: 2 via RESPIRATORY_TRACT
  Filled 2014-01-15: qty 6.7

## 2014-01-15 NOTE — Discharge Instructions (Signed)
Chest Pain (Nonspecific) It is often hard to give a diagnosis for the cause of chest pain. There is always a chance that your pain could be related to something serious, such as a heart attack or a blood clot in the lungs. You need to follow up with your doctor. HOME CARE  If antibiotic medicine was given, take it as directed by your doctor. Finish the medicine even if you start to feel better.  For the next few days, avoid activities that bring on chest pain. Continue physical activities as told by your doctor.  Do not use any tobacco products. This includes cigarettes, chewing tobacco, and e-cigarettes.  Avoid drinking alcohol.  Only take medicine as told by your doctor.  Follow your doctor's suggestions for more testing if your chest pain does not go away.  Keep all doctor visits you made. GET HELP IF:  Your chest pain does not go away, even after treatment.  You have a rash with blisters on your chest.  You have a fever. GET HELP RIGHT AWAY IF:   You have more pain or pain that spreads to your arm, neck, jaw, back, or belly (abdomen).  You have shortness of breath.  You cough more than usual or cough up blood.  You have very bad back or belly pain.  You feel sick to your stomach (nauseous) or throw up (vomit).  You have very bad weakness.  You pass out (faint).  You have chills. This is an emergency. Do not wait to see if the problems will go away. Call your local emergency services (911 in [place] Do not drive yourself to the hospital. MAKE SURE YOU:   Understand these instructions.  Will watch your condition.  Will get help right away if you are not doing well or get worse. Document Released: 11/28/2007 Document Revised: 06/16/2013 Document Reviewed: 11/28/2007 [facility] Patient Information 2015 [place], [place]. This information is not intended to replace advice given to you by your health care provider. Make sure you discuss any questions you have with your  health care provider. Syncope Syncope is a medical term for fainting or passing out. This means you lose consciousness and drop to the ground. People are generally unconscious for less than 5 minutes. You may have some muscle twitches for up to 15 seconds before waking up and returning to normal. Syncope occurs more often in older adults, but it can happen to anyone. While most causes of syncope are not dangerous, syncope can be a sign of a serious medical problem. It is important to seek medical care.  CAUSES  Syncope is caused by a sudden drop in blood flow to the brain. The specific cause is often not determined. Factors that can bring on syncope include:  Taking medicines that lower blood pressure.  Sudden changes in posture, such as standing up quickly.  Taking more medicine than prescribed.  Standing in one place for too long.  Seizure disorders.  Dehydration and excessive exposure to heat.  Low blood sugar (hypoglycemia).  Straining to have a bowel movement.  Heart disease, irregular heartbeat, or other circulatory problems.  Fear, emotional distress, seeing blood, or severe pain. SYMPTOMS  Right before fainting, you may:  Feel dizzy or light-headed.  Feel nauseous.  See all white or all black in your field of vision.  Have cold, clammy skin. DIAGNOSIS  Your health care provider will ask about your symptoms, perform a physical exam, and perform an electrocardiogram (ECG) to record the electrical activity of  your heart. Your health care provider may also perform other heart or blood tests to determine the cause of your syncope which may include:  Transthoracic echocardiogram (TTE). During echocardiography, sound waves are used to evaluate how blood flows through your heart.  Transesophageal echocardiogram (TEE).  Cardiac monitoring. This allows your health care provider to monitor your heart rate and rhythm in real time.  Holter monitor. This is a portable device  that records your heartbeat and can help diagnose heart arrhythmias. It allows your health care provider to track your heart activity for several days, if needed.  Stress tests by exercise or by giving medicine that makes the heart beat faster. TREATMENT  In most cases, no treatment is needed. Depending on the cause of your syncope, your health care provider may recommend changing or stopping some of your medicines. HOME CARE INSTRUCTIONS  Have someone stay with you until you feel stable.  Do not drive, use machinery, or play sports until your health care provider says it is okay.  Keep all follow-up appointments as directed by your health care provider.  Lie down right away if you start feeling like you might faint. Breathe deeply and steadily. Wait until all the symptoms have passed.  Drink enough fluids to keep your urine clear or pale yellow.  If you are taking blood pressure or heart medicine, get up slowly and take several minutes to sit and then stand. This can reduce dizziness. SEEK IMMEDIATE MEDICAL CARE IF:   You have a severe headache.  You have unusual pain in the chest, abdomen, or back.  You are bleeding from your mouth or rectum, or you have black or tarry stool.  You have an irregular or very fast heartbeat.  You have pain with breathing.  You have repeated fainting or seizure-like jerking during an episode.  You faint when sitting or lying down.  You have confusion.  You have trouble walking.  You have severe weakness.  You have vision problems. If you fainted, call your local emergency services (911 in [place] Do not drive yourself to the hospital.  MAKE SURE YOU:  Understand these instructions.  Will watch your condition.  Will get help right away if you are not doing well or get worse. Document Released: 06/11/2005 Document Revised: 06/16/2013 Document Reviewed: 08/10/2011 [facility] Patient Information 2015 [place], [place]. This information is not  intended to replace advice given to you by your health care provider. Make sure you discuss any questions you have with your health care provider.

## 2014-01-15 NOTE — ED Provider Notes (Signed)
Medical screening examination/treatment/procedure(s) were conducted as a shared visit with non-physician practitioner(s) and myself.  I personally evaluated the patient during the encounter.   EKG Interpretation   Date/Time:  Friday January 15 2014 17:40:15 EDT Ventricular Rate:  89 PR Interval:  118 QRS Duration: 82 QT Interval:  346 QTC Calculation: 420 R Axis:   76 Text Interpretation:  Normal sinus rhythm No significant change since last  tracing Confirmed by Yifan Auker  MD, Caryn BeeKEVIN ([id number]) on 01/15/2014 11:48:56 PM     Pt with hx bipolar dis, anxiety, c/o becoming upset after argument, mid chest pain. Worse w palpation/movement. No sob. +chest wall tenderness, reproducing symptoms.    Suzi RootsKevin E Ercilia Bettinger, MD 01/15/14 [phone]

## 2014-01-15 NOTE — ED Notes (Signed)
Pt states that he began having chest pain while walking today pain is worse with breathing. Sinus tach with EMS. 119/79 101bpm resp16 [id number] 97%room air

## 2014-01-15 NOTE — ED Notes (Signed)
Pt reports walking and began to have cp around 1715. Pt reports stabbing pain to center of cp, pain with inhalation. Pt denies any sob, nausea or vomiting. Pt reports never having anything like this happen. Pt reports having dizziness and remembering passing out and waking up in a paramedics truck. Pt has hx of Asthma. Pt rates cp 6/10.

## 2014-01-15 NOTE — ED Notes (Signed)
Discharge instructions reviewed with pt. Pt verbalized understanding.   

## 2014-01-15 NOTE — ED Provider Notes (Signed)
CSN: [id number]     Arrival date & time 01/15/14  1734 History   First MD Initiated Contact with Patient 01/15/14 2029     Chief Complaint  Patient presents with  . Chest Pain     (Consider location/radiation/quality/duration/timing/severity/associated sxs/prior Treatment) HPI Comments: Patient with past medical history of bipolar, schizophrenia, substance abuse, and asthma presents emergency department with chief complaint of syncopal episode. He states that he was having an argument, became slightly agitated, noticed some central chest pain, and then states that he got dizzy, and passed out. Reports waking up in the EMS truck. He states this has not happened to him before. He states that currently his pain is a 6/10. He denies any shortness of breath and dizziness now. Denies any family history of sudden cardiac death. Denies any illicit drug use.  The history is provided by the patient. No language interpreter was used.    Past Medical History  Diagnosis Date  . Arthritis   . GERD (gastroesophageal reflux disease)   . Bronchitis   . Seasonal allergies   . ADHD (attention deficit hyperactivity disorder)   . Bipolar 1 disorder   . Schizophrenia   . Suicide attempt   . Substance abuse   . Asthma    Past Surgical History  Procedure Laterality Date  . Fracture surgery    . Knee surgery     No family history on file. History  Substance Use Topics  . Smoking status: Current Every Day Smoker -- 1 years    Types: Cigarettes  . Smokeless tobacco: Not on file  . Alcohol Use: No    Review of Systems  Respiratory: Negative for shortness of breath.   Cardiovascular: Positive for chest pain.  Neurological: Positive for syncope.  All other systems reviewed and are negative.     Allergies  Bee venom; Dilaudid; Aspirin; Ibuprofen; Morphine and related; Toradol; and Tramadol  Home Medications   Prior to Admission medications   Medication Sig Start Date End Date Taking?  Authorizing Provider  acetaminophen (TYLENOL) 500 MG tablet Take 1,000 mg by mouth every 6 (six) hours as needed for pain.   Yes Historical Provider, MD   BP 115/73  Pulse 85  Temp(Src) 98.6 F (37 C) (Oral)  Resp 16  SpO2 97% Physical Exam  Nursing note and vitals reviewed. Constitutional: He is oriented to person, place, and time. He appears well-developed and well-nourished.  HENT:  Head: Normocephalic and atraumatic.  Eyes: Conjunctivae and EOM are normal. Pupils are equal, round, and reactive to light. Right eye exhibits no discharge. Left eye exhibits no discharge. No scleral icterus.  Neck: Normal range of motion. Neck supple. No JVD present.  Cardiovascular: Normal rate, regular rhythm and normal heart sounds.  Exam reveals no gallop and no friction rub.   No murmur heard. Pulmonary/Chest: Effort normal and breath sounds normal. No respiratory distress. He has no wheezes. He has no rales. He exhibits no tenderness.  Abdominal: Soft. He exhibits no distension and no mass. There is no tenderness. There is no rebound and no guarding.  Musculoskeletal: Normal range of motion. He exhibits no edema and no tenderness.  Neurological: He is alert and oriented to person, place, and time.  Skin: Skin is warm and dry.  Psychiatric: He has a normal mood and affect. His behavior is normal. Judgment and thought content normal.    ED Course  Procedures (including critical care time) Labs Review Labs Reviewed  CBC - Abnormal; Notable for the  following:    WBC 13.9 (*)    All other components within normal limits  BASIC METABOLIC PANEL - Abnormal; Notable for the following:    Glucose, Bld 113 (*)    All other components within normal limits  I-STAT TROPOININ, ED    Imaging Review Dg Chest 2 View  01/15/2014   CLINICAL DATA:  Chest pain  EXAM: CHEST  2 VIEW  COMPARISON:  09/30/2012  FINDINGS: The heart size and mediastinal contours are within normal limits. Both lungs are clear. The  visualized skeletal structures are unremarkable.  IMPRESSION: No active cardiopulmonary disease.   Electronically Signed   By: Kennith CenterEric  Mansell M.D.   On: 01/15/2014 20:22     EKG Interpretation None     ED ECG REPORT  I personally interpreted this EKG   Date: 01/15/2014   Rate: 89  Rhythm: normal sinus rhythm  QRS Axis: normal  Intervals: normal  ST/T Wave abnormalities: normal  Conduction Disutrbances:none  Narrative Interpretation:   Old EKG Reviewed: none available     MDM   Final diagnoses:  Chest pain, unspecified chest pain type  Syncope, unspecified syncope type    Patient with chest pain, and syncopal episode.  Low risk for serious outcome per [facility] [place] [place] Syncope Rule.  No hx of CHF, hematocrit >30%, no new EKG changes or arrhythmias, no SOB, systolic BP >90 in triage.  He is PERC negative in the ED.  HEART score is 0.  9:23 PM Patient is not orthostatic. He states that he is feeling well. He is requesting to be discharged. I do not have any reservations about this at this time. Discharged home in good condition. Return precautions given. Patient understands and agrees with the plan.     Roxy Horsemanobert Mashal Slavick, PA-C 01/15/14 2124

## 2014-01-27 ENCOUNTER — Encounter (HOSPITAL_COMMUNITY): Payer: Self-pay | Admitting: Emergency Medicine

## 2014-01-27 ENCOUNTER — Emergency Department (HOSPITAL_COMMUNITY)
Admission: EM | Admit: 2014-01-27 | Discharge: 2014-01-27 | Disposition: A | Discharge status: Home / Self Care | Payer: Self-pay | Attending: Emergency Medicine | Admitting: Emergency Medicine

## 2014-01-27 DIAGNOSIS — M722 Plantar fascial fibromatosis: Secondary | ICD-10-CM | POA: Insufficient documentation

## 2014-01-27 DIAGNOSIS — Z9889 Other specified postprocedural states: Secondary | ICD-10-CM | POA: Insufficient documentation

## 2014-01-27 DIAGNOSIS — J45909 Unspecified asthma, uncomplicated: Secondary | ICD-10-CM | POA: Insufficient documentation

## 2014-01-27 DIAGNOSIS — M129 Arthropathy, unspecified: Secondary | ICD-10-CM | POA: Insufficient documentation

## 2014-01-27 DIAGNOSIS — Z8719 Personal history of other diseases of the digestive system: Secondary | ICD-10-CM | POA: Insufficient documentation

## 2014-01-27 DIAGNOSIS — F17200 Nicotine dependence, unspecified, uncomplicated: Secondary | ICD-10-CM | POA: Insufficient documentation

## 2014-01-27 DIAGNOSIS — M79609 Pain in unspecified limb: Secondary | ICD-10-CM | POA: Insufficient documentation

## 2014-01-27 DIAGNOSIS — Z8659 Personal history of other mental and behavioral disorders: Secondary | ICD-10-CM | POA: Insufficient documentation

## 2014-01-27 MED ORDER — CYCLOBENZAPRINE HCL 10 MG PO TABS: 10.0000 mg | ORAL_TABLET | Freq: Two times a day (BID) | ORAL | Status: DC | PRN

## 2014-01-27 NOTE — ED Notes (Signed)
Patient states that primary doctor passed away and has not kept up with cortisone shots.

## 2014-01-27 NOTE — ED Provider Notes (Signed)
CSN: [id number]     Arrival date & time 01/27/14  0922 History  This chart was [id number] for Emilia BeckKaitlyn Dominico Rod, PA-C, working with Merrie RoofJohn David Wofford III,  MD by Leona CarryG. Clay Sherrill, ED Scribe. [facility] The patient's care was started at 9:37 AM.    Chief Complaint  Patient presents with  . Foot Pain   Patient is a 28 y.o. male presenting with lower extremity pain. The history is provided by the patient. No language interpreter was used.  Foot Pain Pertinent negatives include no chest pain, no abdominal pain and no shortness of breath.   HPI Comments: Fred Wilson is a 28 y.o. male with a history of tendonitis in both heels who presents to the Emergency Department complaining of sharp, bilateral foot pain in his heels and on the top of his feet beginning four weeks ago. Patient arrived at the ED today by EMS. He reports that the pain is worse in the morning and after sitting for extended periods of time. He also complains of associated "stiffness" in his entire body after sitting for extended periods. Patient reports that he taken taken Tylenol (2 pills every 4-8 hours) and applied ice to his feet without relief of the pain. He states that he has been given cortisone shots in the past, but did not specify who administered them. Patient is allergic to Toradol, Morphine, and Phenergan. He states that these medications cause his "glands to swell."  Patient does not have a PCP.    Past Medical History  Diagnosis Date  . Arthritis   . GERD (gastroesophageal reflux disease)   . Bronchitis   . Seasonal allergies   . ADHD (attention deficit hyperactivity disorder)   . Bipolar 1 disorder   . Schizophrenia   . Suicide attempt   . Substance abuse   . Asthma    Past Surgical History  Procedure Laterality Date  . Fracture surgery    . Knee surgery     History reviewed. No pertinent family history. History  Substance Use Topics  . Smoking status: Current Every Day Smoker  -- 1 years    Types: Cigarettes  . Smokeless tobacco: Not on file  . Alcohol Use: No    Review of Systems  Constitutional: Negative for fever, chills and fatigue.  HENT: Negative for trouble swallowing.   Eyes: Negative for visual disturbance.  Respiratory: Negative for shortness of breath.   Cardiovascular: Negative for chest pain and palpitations.  Gastrointestinal: Negative for nausea, vomiting, abdominal pain and diarrhea.  Genitourinary: Negative for dysuria and difficulty urinating.  Musculoskeletal: Positive for arthralgias (foot pain). Negative for neck pain.  Skin: Negative for color change.  Neurological: Negative for dizziness and weakness.  Psychiatric/Behavioral: Negative for dysphoric mood.      Allergies  Bee venom; Dilaudid; Aspirin; Ibuprofen; Morphine and related; Toradol; and Tramadol  Home Medications   Prior to Admission medications   Medication Sig Start Date End Date Taking? Authorizing Provider  acetaminophen (TYLENOL) 500 MG tablet Take 1,000 mg by mouth every 6 (six) hours as needed for pain.    Historical Provider, MD   Triage Vitals: BP 145/84  Pulse 68  Temp(Src) 98.5 F (36.9 C) (Oral)  SpO2 100% Physical Exam  Nursing note and vitals reviewed. Constitutional: He appears well-developed and well-nourished. No distress.  HENT:  Head: Normocephalic and atraumatic.  Eyes: Conjunctivae and EOM are normal.  Neck: Normal range of motion.  Cardiovascular: Normal rate and regular rhythm.  Exam reveals no gallop and no friction rub.   No murmur heard. Pulmonary/Chest: Effort normal and breath sounds normal. He has no wheezes. He has no rales. He exhibits no tenderness.  Musculoskeletal: Normal range of motion.  Tenderness to palpation at bilateral calcaneal aspects of plantar aspect of feet. No obvious deformity. Plantar foot pain with dorsiflexion of bilateral feet.   Neurological: He is alert. Coordination normal.  Speech is goal-oriented. Moves  limbs without ataxia.   Skin: Skin is warm and dry.  Psychiatric: He has a normal mood and affect. His behavior is normal.    ED Course  Procedures (including critical care time) DIAGNOSTIC STUDIES: Oxygen Saturation is 100% on room air, normal by my interpretation.    COORDINATION OF CARE: 9:43 AM-Discussed treatment plan which includes referral to PCP with pt at bedside and pt agreed to plan.     Labs Review Labs Reviewed - No data to display  Imaging Review No results found.   EKG Interpretation None      MDM   Final diagnoses:  Plantar fasciitis, bilateral    10:03 AM Patient has chronic plantar fasciitis of bilateral feet. Patient has "tried everything" at home. I made a few suggestions for pain relief but patient states he already tried them at home. He is allergic to all pain medication except for tylenol per patient, which he already tried. Patient seen by Social Work who will set him up with an orange card. Patient will have Flexeril to try for pain. No further evaluation needed at this time.   I personally performed the services described in this documentation, which was scribed in my presence. The recorded information has been reviewed and is accurate.    Emilia Beck, PA-C 01/27/14 1354

## 2014-01-27 NOTE — Progress Notes (Signed)
  CARE MANAGEMENT ED NOTE 01/27/2014  Patient:  Katherine MantleGAINEY,Deepak L   Account Number:  [id number]  Date Initiated:  01/27/2014  Documentation initiated by:  Ferdinand CavaSCHETTINO,Michael Ventresca  Subjective/Objective Assessment:   28 yo male presenting to the ED with c/o foot pain     Subjective/Objective Assessment Detail:     Action/Plan:   Patient will follow up to locate a PCP from the resource list and connect to apply for the ornage card   Action/Plan Detail:   Anticipated DC Date:       Status Recommendation to Physician:   Result of Recommendation:  Agreed    DC Planning Services  CM consult  Other  PCP issues    Choice offered to / List presented to:  C-1 Patient          Status of service:  Completed, signed off  ED Comments:   ED Comments Detail:  This CM spoke with the patient regarding ED visit and discussed PCP status. patient confirmed that he does not have a PCP or insurance and he does not work. This CM discussed the importance of a PCP for basic medical needs and that the ED and EMS are better use for emergency situations. [place] the [place] with [place] [facility] [place] [facility] [facility]. Discussed the ornage card program with the patient since [place]. Explained that a [facility] liaison will mail him out an application and then assist with connecting him to a PCP and pharmacy. Confirmed patient address and phone number. Patient had no other questions or concerns. ED PA made aware of information provided.

## 2014-01-27 NOTE — ED Notes (Addendum)
Patient had told me on his way in that he had a ride home.   Patient now states no way home.   Patient requesting bus pass.  Patient states he will try to get another ride home.

## 2014-01-27 NOTE — Discharge Instructions (Signed)
Take Flexeril as needed for pain. Follow up with primary care provider for further evaluation and management of your foot pain. Refer to attached documents for more information.

## 2014-01-27 NOTE — ED Notes (Signed)
Pt here for bilateral foot pain. Chronic in nature.

## 2014-01-27 NOTE — ED Provider Notes (Signed)
Medical screening examination/treatment/procedure(s) were performed by non-physician practitioner and as supervising physician I was immediately available for consultation/collaboration.   Candyce ChurnJohn David Natalin Bible III, MD 01/27/14 2206

## 2014-05-10 ENCOUNTER — Encounter (HOSPITAL_COMMUNITY): Payer: Self-pay | Admitting: Emergency Medicine

## 2014-05-10 ENCOUNTER — Emergency Department (HOSPITAL_COMMUNITY)
Admission: EM | Admit: 2014-05-10 | Discharge: 2014-05-10 | Disposition: A | Discharge status: Home / Self Care | Payer: Self-pay | Attending: Emergency Medicine | Admitting: Emergency Medicine

## 2014-05-10 DIAGNOSIS — J45909 Unspecified asthma, uncomplicated: Secondary | ICD-10-CM | POA: Insufficient documentation

## 2014-05-10 DIAGNOSIS — Z8719 Personal history of other diseases of the digestive system: Secondary | ICD-10-CM | POA: Insufficient documentation

## 2014-05-10 DIAGNOSIS — Z8659 Personal history of other mental and behavioral disorders: Secondary | ICD-10-CM | POA: Insufficient documentation

## 2014-05-10 DIAGNOSIS — N4889 Other specified disorders of penis: Secondary | ICD-10-CM | POA: Insufficient documentation

## 2014-05-10 DIAGNOSIS — Z720 Tobacco use: Secondary | ICD-10-CM | POA: Insufficient documentation

## 2014-05-10 DIAGNOSIS — M1990 Unspecified osteoarthritis, unspecified site: Secondary | ICD-10-CM | POA: Insufficient documentation

## 2014-05-10 NOTE — ED Provider Notes (Signed)
CSN: [id number]     Arrival date & time 05/10/14  0008 History   First MD Initiated Contact with Patient 05/10/14 0029     Chief Complaint  Patient presents with  . Penis Pain     (Consider location/radiation/quality/duration/timing/severity/associated sxs/prior Treatment) HPI  Katherine MantleByron L Wolpert is a 28 y.o. male complaining of pain to volar penis when he touches it x3 days. Patient states he had a rigorous sex with his wife before the onset of the discomfort. When he is not touching the penis there is no pain. He is taking no pain medication prior to arrival, rates his pain at 4 out of 10, described as burning. Patient denies urethral discharge, rash, difficulty urinating.  Past Medical History  Diagnosis Date  . Arthritis   . GERD (gastroesophageal reflux disease)   . Bronchitis   . Seasonal allergies   . ADHD (attention deficit hyperactivity disorder)   . Bipolar 1 disorder   . Schizophrenia   . Suicide attempt   . Substance abuse   . Asthma    Past Surgical History  Procedure Laterality Date  . Fracture surgery    . Knee surgery     No family history on file. History  Substance Use Topics  . Smoking status: Current Every Day Smoker -- 1 years    Types: Cigarettes  . Smokeless tobacco: Not on file  . Alcohol Use: No    Review of Systems   10 systems reviewed and found to be negative, except as noted in the HPI.   Allergies  Bee venom; Dilaudid; Aspirin; Ibuprofen; Morphine and related; Toradol; and Tramadol  Home Medications   Prior to Admission medications   Medication Sig Start Date End Date Taking? Authorizing Provider  acetaminophen (TYLENOL) 500 MG tablet Take 1,000 mg by mouth every 6 (six) hours as needed for pain.    Historical Provider, MD  cyclobenzaprine (FLEXERIL) 10 MG tablet Take 1 tablet (10 mg total) by mouth 2 (two) times daily as needed for muscle spasms. 01/27/14   Kaitlyn Szekalski, PA-C   BP 139/86 mmHg  Pulse 73  Temp(Src) 98 F (36.7 C)  (Oral)  Resp 16  Ht 5\' 9"  (1.753 m)  Wt 178 lb (80.74 kg)  BMI 26.27 kg/m2  SpO2 99% Physical Exam  Constitutional: He is oriented to person, place, and time. He appears well-developed and well-nourished. No distress.  HENT:  Head: Normocephalic and atraumatic.  Mouth/Throat: Oropharynx is clear and moist.  Eyes: Conjunctivae and EOM are normal. Pupils are equal, round, and reactive to light.  Neck: Normal range of motion.  Cardiovascular: Normal rate, regular rhythm and intact distal pulses.   Pulmonary/Chest: Effort normal and breath sounds normal. No stridor. No respiratory distress. He has no wheezes. He has no rales. He exhibits no tenderness.  Abdominal: Soft. Bowel sounds are normal. He exhibits no distension. There is no tenderness. There is no rebound and no guarding.  Genitourinary: Prostate normal. Penile tenderness present.  No deformity, mild tenderness palpation on the medial full or side of the penis. No testicular pain or swelling, no urethral discharge.  Musculoskeletal: Normal range of motion.  Neurological: He is alert and oriented to person, place, and time.  Psychiatric: He has a normal mood and affect.  Nursing note and vitals reviewed.   ED Course  Procedures (including critical care time) Labs Review Labs Reviewed - No data to display  Imaging Review No results found.   EKG Interpretation None  MDM   Final diagnoses:  Pain, penile    Filed Vitals:   05/10/14 0014  BP: 139/86  Pulse: 73  Temp: 98 F (36.7 C)  TempSrc: Oral  Resp: 16  Height: 5\' 9"  (1.753 m)  Weight: 178 lb (80.74 kg)  SpO2: 99%     Katherine MantleByron L Karge is a 28 y.o. male presenting with Very mild pain in the volar penal shaft only when patient palpates the area. Pain occurred after vigorous sex.Doubt penile fracture.reassurance given, advised patient to start anti-inflammatory medications, urology referral given.  Evaluation does not show pathology that would require  ongoing emergent intervention or inpatient treatment. Pt is hemodynamically stable and mentating appropriately. Discussed findings and plan with patient/guardian, who agrees with care plan. All questions answered. Return precautions discussed and outpatient follow up given.        Wynetta Emeryicole Rikita Grabert, PA-C 05/10/14 0100  Samuel JesterKathleen McManus, DO 05/12/14 [id number]

## 2014-05-10 NOTE — Discharge Instructions (Signed)
For pain control please take Ibuprofen (also known as Motrin or Advil) 400mg  (this is normally 2 over the counter pills) every 6 hours. Take with food to minimize stomach irritation.  Do not hesitate to return to the emergency room for any new, worsening or concerning symptoms.  Please obtain primary care using resource guide below. But the minute you were seen in the emergency room and that they will need to obtain records for further outpatient management.   Emergency Department Resource Guide 1) Find a Doctor and Pay Out of Pocket Although you won't have to find out who is covered by your insurance plan, it is a good idea to ask around and get recommendations. You will then need to call the office and see if the doctor you have chosen will accept you as a new patient and what types of options they offer for patients who are self-pay. Some doctors offer discounts or will set up payment plans for their patients who do not have insurance, but you will need to ask so you aren't surprised when you get to your appointment.  2) Contact Your Local Health Department Not all health departments have doctors that can see patients for sick visits, but many do, so it is worth a call to see if yours does. If you don't know where your local health department is, you can check in your phone book. The CDC also has a tool to help you locate your state's health department, and many state websites also have listings of all of their local health departments.  3) Find a Walk-in Clinic If your illness is not likely to be very severe or complicated, you may want to try a walk in clinic. These are popping up all over the country in pharmacies, drugstores, and shopping centers. They're usually staffed by nurse practitioners or physician assistants that have been trained to treat common illnesses and complaints. They're usually fairly quick and inexpensive. However, if you have serious medical issues or chronic medical  problems, these are probably not your best option.  No Primary Care Doctor: - Call Health Connect at  [phone] - they can help you locate a primary care doctor that  accepts your insurance, provides certain services, etc. - Physician Referral Service- [phone]  Chronic Pain Problems: Organization         Address  Phone   Notes  [facility]  [phone] Patients need to be referred by their primary care doctor.   Medication Assistance: Organization         Address  Phone   Notes  [facility] [address] [place], [place] [address] [phone] --Must be a resident of [place] -- Must have NO insurance coverage whatsoever (no Medicaid/ Medicare, etc.) -- The pt. MUST have a primary care doctor that directs their care regularly and follows them in the community   Med[facility]ist  [phone]   [employer]  [phone]    Agencies that provide inexpensive medical care: Organization         Address  Phone   Notes  [facility]  [phone]   [facility]    [phone]   [facility] [address] [place], [place] [address] [phone]   [facility] [address], [place] [phone]   [facility]    [phone]   [facility]    [phone]   [address], [place] Phone:  [phone], Fax:  [phone] Hours of Operation:  9 am - 6 pm, M-F.  Also accepts Medicaid/Medicare and self-pay.  [facility] for [place] [address], [address], [place] Phone: [phone], Fax: [phone]. Hours of Operation:  8:30 am - 5:30 pm, M-F.  Also accepts Medicaid and self-pay.  [facility] [address] [place] Phone: [phone]   [place], [place], [place] [phone], Ext. [phone]  Mondays & Thursdays: 7-9 AM.  First 15 patients are seen on a first come, first serve basis.    [place] Providers:  Organization         Address  Phone   Notes  [facility] [address], [place] [phone] Also accepts self-pay patients.  [facility] [address], [place] Negron Torres  [phone]   [place], [address], [place] [phone]   [facility] [address] [place] [phone]   Lucianne Lei [address], [place]   [phone] Only accepts [place] Access [place] patients after they have their name applied to their card.   Self-Pay (no insurance) in [facility]:  Organization         Address  Phone   Notes  Sickle Cell Patients, [facility] [place] [phone]   [facility] [place] [phone]   [facility] [place]  [place], [place], [place] [phone]   [facility]/Dr. Osei-Bonsu  [address] [place] or [address], [place] [phone] Phone number for both [place] and [place] locations is the same.  Urgent Medical and [facility] [address] [place] [phone]   [facility] [address] [place] or [address] [phone]   [facility] [address] [place] [phone], phone; [phone], fax Sees patients 1st and 3rd Saturday of every month.  Must not qualify for public or private insurance (i.e. Medicaid, Medicare, [place] Health Choice, Veterans' Benefits)  Household income should be no more than 200% of the poverty level The clinic cannot treat you if you are pregnant or think you are pregnant  Sexually transmitted diseases are not treated at  the clinic.    Dental Care: Organization         Address  Phone  Notes  [facility] [phone] Accepts children up to age 55 who are enrolled in [place] or [place]; pregnant women with a Medicaid card; and children who have applied for Medicaid or [place] Health Choice, but were declined, whose parents can pay a reduced fee at time of service.  [facility] Department of [facility]  [address], [place] [phone] Accepts children up to age 74 who are enrolled in [place] or [place]; pregnant women with a Medicaid card; and children who have applied for Medicaid or [place] Health Choice, but were declined, whose parents can pay a reduced fee at time of service.  [place]  Access PROGRAM  [place] [phone] Patients are seen by appointment only. Walk-ins are not accepted. [place] will see patients 18 years of age and older. Monday - Tuesday (8am-5pm) Most Wednesdays (8:30-5pm) $30 per visit, cash only  [facility] Adult Dental Access PROGRAM  [address], [facility] [phone] Patients are seen by appointment only. Walk-ins are not accepted. [place] will see patients 66 years of age and older. One Wednesday Evening (Monthly: Volunteer Based).  $30 per visit, cash only  [place]  [phone] for adults; Children under age 79, call Graduate Pediatric Dentistry at [phone]. Children aged 46-14, please call [phone] to request a pediatric application.  Dental services are provided in all areas of dental care including fillings, crowns and bridges, complete and partial dentures, implants, gum treatment, root canals, and extractions. Preventive care is also provided. Treatment is provided to both adults and children. Patients are selected via a lottery and there is often a  waiting list.   [facility] [address] [place]  [phone] [email or web address]   [facility] [address] [place], [place] [phone], Ext. [phone] Second and Fourth Thursday of each month, opens at 6:30 AM; Clinic ends at 9 AM.  Patients are seen on a first-come first-served basis, and a limited number are seen during each clinic.   [facility]  [address] Hillard Danker [place], [place] [phone]   Eligibility Requirements You must have lived in [place], [place], or [place] counties for at least the last three months.   You cannot be eligible for state or federal sponsored [employer], including [employer], [place], or [employer].   You generally cannot be eligible for healthcare insurance through your employer.    How to apply: Eligibility screenings are held every Tuesday and Wednesday afternoon from 1:00 pm until 4:00 pm. You do not need an appointment for the interview!  [facility] [address] [place], [place]   [place]  [place] Department  [place]  [phone]    Behavioral Health Resources in the Community: Intensive Outpatient Programs Organization         Address  Phone  Notes  [place]. [address], [place], [place] [phone]   [facility] [address] [place], [place]   ADS: Alcohol & Drug Svcs [address], [place], [place]   [place] [address]  [place], [place] or [phone]   Substance Abuse Resources Organization         Address  Phone  Notes  Alcohol and Drug Services  [phone]   [place]  [phone]   [facility] [place]   Chinita Pester  [phone]   Residential & Outpatient Substance Abuse  Program  [phone]   Psychological Services Organization         Address  Phone  Notes  [facility] [place]  [place]  [phone]   [place] [address] [place] or [phone]    Mobile Crisis Teams Organization         Address  Phone  Notes  [facility], Mobile Crisis Care Unit  [phone]   [facility]  [address] [place],  [place] [address] [place] [phone]    Self-Help/Support Groups Organization         Address  Phone             Notes  Mental Health Assoc. of [place] - variety of support groups  [place] Call for more information  Narcotics Anonymous (NA), [facility] [address], [employer] [place]  2 meetings at this location   Special educational needs teacher         Address  Phone  Notes  [facility] Residential Treatment [place],    [place]  [phone]   [facility]  [address] [place] 937902, [place], [place]   [place], [place] [phone] Admissions: 8am-3pm M-F  Incentives Substance [place] [address]    [place], [place] [phone]   [facility] [address] [place], [place], [place]   [facility] [address]  [place], [place]   [facility] Programs - Intensive Outpatient [address], [place], [place]   [facility] ([address] [place].,  [place], [place] [phone] or [phone]   [facility] (RTS) [address] [place], [place] Accepts Medicaid  [facility] [place] [address],  [place] [phone] Substance Abuse/Addiction Treatment   [facility] Organization          Address  Phone  Notes  [facility]  [phone]   Domenic Schwab, PhD [address] Arlis Porta [place], [place]   [phone] or [phone]   [place] Celina Evan [place], [place] [phone]   [facility] [address], [place], [place] [phone] Insurance/Medicaid/sponsorship through [facility] and Families [address], [address] [place]                                    [place], [place] [phone] [place] [address], [place] [phone]    Dr. Adele Schilder  [phone]   [facility] Dept. 1) [address], [place] 2) [place] 3)  [address], [place] [phone]  [phone]   [place] [phone] or [phone] (After Hours)

## 2014-05-10 NOTE — ED Notes (Signed)
Patient with pain on the underside of his penis.  Patient denies any drainage or burning sensations.  Patient states that it has been going on for about 3 days.

## 2014-05-25 ENCOUNTER — Emergency Department (HOSPITAL_COMMUNITY)
Admission: EM | Admit: 2014-05-25 | Discharge: 2014-05-25 | Disposition: A | Discharge status: Home / Self Care | Payer: Self-pay | Attending: Emergency Medicine | Admitting: Emergency Medicine

## 2014-05-25 ENCOUNTER — Encounter (HOSPITAL_COMMUNITY): Payer: Self-pay | Admitting: *Deleted

## 2014-05-25 ENCOUNTER — Emergency Department (HOSPITAL_COMMUNITY): Payer: Self-pay

## 2014-05-25 DIAGNOSIS — M1990 Unspecified osteoarthritis, unspecified site: Secondary | ICD-10-CM | POA: Insufficient documentation

## 2014-05-25 DIAGNOSIS — Z79899 Other long term (current) drug therapy: Secondary | ICD-10-CM | POA: Insufficient documentation

## 2014-05-25 DIAGNOSIS — Z8719 Personal history of other diseases of the digestive system: Secondary | ICD-10-CM | POA: Insufficient documentation

## 2014-05-25 DIAGNOSIS — Z8659 Personal history of other mental and behavioral disorders: Secondary | ICD-10-CM | POA: Insufficient documentation

## 2014-05-25 DIAGNOSIS — Z720 Tobacco use: Secondary | ICD-10-CM | POA: Insufficient documentation

## 2014-05-25 DIAGNOSIS — Z8781 Personal history of (healed) traumatic fracture: Secondary | ICD-10-CM | POA: Insufficient documentation

## 2014-05-25 DIAGNOSIS — H6092 Unspecified otitis externa, left ear: Secondary | ICD-10-CM | POA: Insufficient documentation

## 2014-05-25 DIAGNOSIS — J45909 Unspecified asthma, uncomplicated: Secondary | ICD-10-CM | POA: Insufficient documentation

## 2014-05-25 DIAGNOSIS — H9202 Otalgia, left ear: Secondary | ICD-10-CM

## 2014-05-25 LAB — I-STAT CREATININE, ED: Creatinine, Ser: 0.9 mg/dL (ref 0.50–1.35)

## 2014-05-25 MED ORDER — CEPHALEXIN 500 MG PO CAPS: 500.0000 mg | ORAL_CAPSULE | Freq: Four times a day (QID) | ORAL | Status: DC

## 2014-05-25 MED ORDER — IOHEXOL 300 MG/ML  SOLN
100.0000 mL | Freq: Once | INTRAMUSCULAR | Status: AC | PRN
  Administered 2014-05-25: 100 mL via INTRAVENOUS

## 2014-05-25 MED ORDER — ACETAMINOPHEN 325 MG PO TABS
975.0000 mg | ORAL_TABLET | Freq: Once | ORAL | Status: AC
  Administered 2014-05-25: 975 mg via ORAL
  Filled 2014-05-25: qty 3

## 2014-05-25 MED ORDER — NEOMYCIN-POLYMYXIN-HC 1 % OT SOLN
4.0000 [drp] | Freq: Four times a day (QID) | OTIC | Status: AC
  Administered 2014-05-25: 4 [drp] via OTIC
  Filled 2014-05-25: qty 10

## 2014-05-25 NOTE — ED Provider Notes (Signed)
28 year old male comes in with left-sided ear pain for the last week. On exam, there is significant swelling of the external auditory canal and pain is elicited when tension is applied to the helix. There is no pre-or postauricular lymph node palpable. Overall picture is consistent with otitis externa. Because of degree of swelling, he will need to be treated with an ear wick and will be given prescriptions for topical and oral antibiotics as well as analgesics as needed. He is to follow-up with ENT.  Medical screening examination/treatment/procedure(s) were conducted as a shared visit with non-physician practitioner(s) and myself.  I personally evaluated the patient during the encounter.    Dione Boozeavid Floris Neuhaus, MD 05/25/14 [phone]

## 2014-05-25 NOTE — Discharge Instructions (Signed)
Use cortisporin drops, 4 drops every 6 hours, and take Keflex as prescribed. Take tylenol as needed for pain. Follow up with an ear, nose, and throat specialist. Return for ear wick removal in 3 days if the ear wick has not already fallen out on its own. Your ENT doctor, Dr. Pollyann Kennedyosen, may also choose to remove this in the office if you are able to get close follow up.  Otitis Externa Otitis externa is a bacterial or fungal infection of the outer ear canal. This is the area from the eardrum to the outside of the ear. Otitis externa is sometimes called "swimmer's ear." CAUSES  Possible causes of infection include:  Swimming in dirty water.  Moisture remaining in the ear after swimming or bathing.  Mild injury (trauma) to the ear.  Objects stuck in the ear (foreign body).  Cuts or scrapes (abrasions) on the outside of the ear. SIGNS AND SYMPTOMS  The first symptom of infection is often itching in the ear canal. Later signs and symptoms may include swelling and redness of the ear canal, ear pain, and yellowish-white fluid (pus) coming from the ear. The ear pain may be worse when pulling on the earlobe. DIAGNOSIS  Your health care provider will perform a physical exam. A sample of fluid may be taken from the ear and examined for bacteria or fungi. TREATMENT  Antibiotic ear drops are often given for 10 to 14 days. Treatment may also include pain medicine or corticosteroids to reduce itching and swelling. HOME CARE INSTRUCTIONS   Apply antibiotic ear drops to the ear canal as prescribed by your health care provider.  Take medicines only as directed by your health care provider.  If you have diabetes, follow any additional treatment instructions from your health care provider.  Keep all follow-up visits as directed by your health care provider. PREVENTION   Keep your ear dry. Use the corner of a towel to absorb water out of the ear canal after swimming or bathing.  Avoid scratching or putting  objects inside your ear. This can damage the ear canal or remove the protective wax that lines the canal. This makes it easier for bacteria and fungi to grow.  Avoid swimming in lakes, polluted water, or poorly chlorinated pools.  You may use ear drops made of rubbing alcohol and vinegar after swimming. Combine equal parts of white vinegar and alcohol in a bottle. Put 3 or 4 drops into each ear after swimming. SEEK MEDICAL CARE IF:   You have a fever.  Your ear is still red, swollen, painful, or draining pus after 3 days.  Your redness, swelling, or pain gets worse.  You have a severe headache.  You have redness, swelling, pain, or tenderness in the area behind your ear. MAKE SURE YOU:   Understand these instructions.  Will watch your condition.  Will get help right away if you are not doing well or get worse. Document Released: 06/11/2005 Document Revised: 10/26/2013 Document Reviewed: 06/28/2011 [facility] Patient Information 2015 [place], [place]. This information is not intended to replace advice given to you by your health care provider. Make sure you discuss any questions you have with your health care provider.

## 2014-05-25 NOTE — ED Provider Notes (Signed)
CSN: [id number]     Arrival date & time 05/25/14  0110 History   First MD Initiated Contact with Patient 05/25/14 0204     Chief Complaint  Patient presents with  . Otalgia    (Consider location/radiation/quality/duration/timing/severity/associated sxs/prior Treatment) HPI Comments: 28 year old male with a history of bronchitis, ADHD, bipolar 1 disorder, schizophrenia, suicide attempt, and substance abuse presents to the emergency department for further evaluation of left ear pain. Patient states that his ear has been painful over the past week. He states that it feels as though things are draining in his inner ear, but he denies seeing any ear discharge. He states that the pain is throbbing in nature and nonradiating. He has taken Tylenol for symptoms without relief. He has noticed that his hearing in the ear is a bit muffled, but he denies hearing loss. He further denies associated fever, sore throat, upper respiratory symptoms, pain behind the ear, outer ear swelling, difficulty with jaw opening, dental pain, neck stiffness, or trauma/injury to the area.  Patient is a 28 y.o. male presenting with ear pain. The history is provided by the patient. No language interpreter was used.  Otalgia   Past Medical History  Diagnosis Date  . Arthritis   . GERD (gastroesophageal reflux disease)   . Bronchitis   . Seasonal allergies   . ADHD (attention deficit hyperactivity disorder)   . Bipolar 1 disorder   . Schizophrenia   . Suicide attempt   . Substance abuse   . Asthma    Past Surgical History  Procedure Laterality Date  . Fracture surgery    . Knee surgery     History reviewed. No pertinent family history. History  Substance Use Topics  . Smoking status: Current Every Day Smoker -- 1 years    Types: Cigarettes  . Smokeless tobacco: Not on file  . Alcohol Use: No    Review of Systems  HENT: Positive for ear pain.   All other systems reviewed and are negative.   Allergies  Bee  venom; Dilaudid; Aspirin; Ibuprofen; Morphine and related; Toradol; and Tramadol  Home Medications   Prior to Admission medications   Medication Sig Start Date End Date Taking? Authorizing Provider  acetaminophen (TYLENOL) 500 MG tablet Take 1,000 mg by mouth every 6 (six) hours as needed for pain.    Historical Provider, MD  cephALEXin (KEFLEX) 500 MG capsule Take 1 capsule (500 mg total) by mouth 4 (four) times daily. 05/25/14   Antony MaduraKelly Alanda Colton, PA-C  cyclobenzaprine (FLEXERIL) 10 MG tablet Take 1 tablet (10 mg total) by mouth 2 (two) times daily as needed for muscle spasms. 01/27/14   Kaitlyn Szekalski, PA-C   BP 127/88 mmHg  Pulse 71  Temp(Src) 97.7 F (36.5 C) (Oral)  Resp 22  SpO2 100%   Physical Exam  Constitutional: He is oriented to person, place, and time. He appears well-developed and well-nourished. No distress.  Nontoxic/nonseptic appearing  HENT:  Head: Normocephalic and atraumatic.  Right Ear: Hearing normal.  Left Ear: Hearing normal. There is tenderness. No drainage. No decreased hearing is noted.  Ears:  Mouth/Throat: Oropharynx is clear and moist. No oropharyngeal exudate.  Unable to visualize the left ear canal secondary to swelling. There appears to be a punctate puncture site just at the external border of the ear canal with some serous crusting; TTP noted. No bleeding. No external ear swelling. No evidence of mastoiditis bilaterally.  Uvula midline and patient tolerating secretions without difficulty.  Eyes: Conjunctivae and EOM  are normal. No scleral icterus.  Neck: Normal range of motion. Neck supple.  No nuchal rigidity or meningismus  Pulmonary/Chest: Effort normal. No respiratory distress.  Musculoskeletal: Normal range of motion.  Neurological: He is alert and oriented to person, place, and time. He exhibits normal muscle tone. Coordination normal.  Skin: Skin is warm and dry. No rash noted. He is not diaphoretic. No erythema. No pallor.  Psychiatric: He has  a normal mood and affect. His behavior is normal.  Nursing note and vitals reviewed.   ED Course  Procedures (including critical care time) Labs Review Labs Reviewed  I-STAT CREATININE, ED    Imaging Review Ct Maxillofacial W/cm  05/25/2014   CLINICAL DATA:  Severe left inner ear pain for 1 week, worsening last night. Initial encounter.  EXAM: CT MAXILLOFACIAL WITH CONTRAST  TECHNIQUE: Multidetector CT imaging of the maxillofacial structures was performed with intravenous contrast. Multiplanar CT image reconstructions were also generated. A small metallic BB was placed on the right temple in order to reliably differentiate right from left.  CONTRAST:  100mL OMNIPAQUE IOHEXOL 300 MG/ML  SOLN  COMPARISON:  None.  FINDINGS: No abnormal focal contrast enhancement is seen.  The left inner ear is unremarkable in appearance. The left mastoid air cells are well aerated. Inner ear structures are grossly normal and symmetric, though the bones of the inner ear are difficult to fully characterize on this study. The external auditory canals are also unremarkable in appearance.  There is mild partial opacification of the left ethmoid air cells. There is mild chronic deformity involving the medial wall of the left orbit. Mucosal thickening is noted at the right maxillary sinus. The remaining visualized paranasal sinuses and right mastoid air cells are well-aerated.  There is no evidence of acute fracture or dislocation. The maxilla and mandible appear intact. The nasal bone is unremarkable in appearance. The visualized dentition demonstrates no acute abnormality. There is developmental misalignment of the maxillary canine teeth bilaterally. The orbits are intact bilaterally.  No significant soft tissue abnormalities are seen. The parapharyngeal fat planes are preserved. The nasopharynx, oropharynx and hypopharynx are unremarkable in appearance. The visualized portions of the valleculae and piriform sinuses are grossly  unremarkable.  The parotid and submandibular glands are within normal limits. No cervical lymphadenopathy is seen. The visualized portions of the brain are unremarkable in appearance.  IMPRESSION: 1. No acute abnormality seen to explain the patient's symptoms. The left inner ear is unremarkable in appearance, and the left mastoid air cells are well aerated. 2. Mild partial opacification of the left ethmoid air cells; mucosal thickening at the right maxillary sinus.   Electronically Signed   By: Roanna RaiderJeffery  Chang M.D.   On: 05/25/2014 04:31     EKG Interpretation None      MDM   Final diagnoses:  Left ear pain  Otitis externa, left    Pt presenting with L ear pain x 1 week. Initially there was concern for canal occlusion as well as possibility of underlying abscess. Due to inability to visualize ear canal, imaging performed which was negative. Symptoms c/w otitis externa. Pt afebrile in NAD. Exam notconcerning for mastoiditis; no evidence of this on CT as well. Ear wick placed in ED along with cortisporin drops.  Will also start on Keflex given extent of swelling. Advised ENT follow up as well as recheck in 3 days along with ear wick removal. Return precautions provided and patient agreeable to plan with no unaddressed concerns.   Filed Vitals:  05/25/14 0127 05/25/14 0523  BP: 132/82 127/88  Pulse: 86 71  Temp: 98.5 F (36.9 C) 97.7 F (36.5 C)  TempSrc: Oral Oral  Resp: 20 22  SpO2: 100% 100%     Antony Madura, PA-C 05/25/14 [phone]

## 2014-05-25 NOTE — ED Notes (Signed)
Ear wick placed and drops placed by PA.  Patient verbalized understanding of home care instructions

## 2014-05-25 NOTE — ED Notes (Signed)
Pt in c/o left earache x1 week, denies other symptoms

## 2014-05-27 ENCOUNTER — Encounter (HOSPITAL_COMMUNITY): Payer: Self-pay | Admitting: Emergency Medicine

## 2014-05-27 ENCOUNTER — Emergency Department (HOSPITAL_COMMUNITY)
Admission: EM | Admit: 2014-05-27 | Discharge: 2014-05-27 | Disposition: A | Discharge status: Home / Self Care | Payer: Self-pay | Attending: Emergency Medicine | Admitting: Emergency Medicine

## 2014-05-27 DIAGNOSIS — H6092 Unspecified otitis externa, left ear: Secondary | ICD-10-CM | POA: Insufficient documentation

## 2014-05-27 DIAGNOSIS — F909 Attention-deficit hyperactivity disorder, unspecified type: Secondary | ICD-10-CM | POA: Insufficient documentation

## 2014-05-27 DIAGNOSIS — Z8719 Personal history of other diseases of the digestive system: Secondary | ICD-10-CM | POA: Insufficient documentation

## 2014-05-27 DIAGNOSIS — X58XXXA Exposure to other specified factors, initial encounter: Secondary | ICD-10-CM | POA: Insufficient documentation

## 2014-05-27 DIAGNOSIS — T162XXA Foreign body in left ear, initial encounter: Secondary | ICD-10-CM | POA: Insufficient documentation

## 2014-05-27 DIAGNOSIS — Y939 Activity, unspecified: Secondary | ICD-10-CM | POA: Insufficient documentation

## 2014-05-27 DIAGNOSIS — Y998 Other external cause status: Secondary | ICD-10-CM | POA: Insufficient documentation

## 2014-05-27 DIAGNOSIS — Z79899 Other long term (current) drug therapy: Secondary | ICD-10-CM | POA: Insufficient documentation

## 2014-05-27 DIAGNOSIS — Z720 Tobacco use: Secondary | ICD-10-CM | POA: Insufficient documentation

## 2014-05-27 DIAGNOSIS — J45909 Unspecified asthma, uncomplicated: Secondary | ICD-10-CM | POA: Insufficient documentation

## 2014-05-27 DIAGNOSIS — Y929 Unspecified place or not applicable: Secondary | ICD-10-CM | POA: Insufficient documentation

## 2014-05-27 NOTE — Discharge Instructions (Signed)
Continue your eardrops for total of 7 days or until medicine runs out. Follow with primary care doctor of any issues.    Draining Ear Ear wax, pus, blood and other fluids are examples of the different types of drainage from ears. Drops or cream may be needed to lessen the itching which may occur with ear drainage. CAUSES   Skin irritations in the ear.  Ear infection.  Swimmer's ear.  Ruptured eardrum.  Foreign object in the ear canal.  Sudden pressure changes.  Head injury. HOME CARE INSTRUCTIONS   Only take over-the-counter or prescription medicines for pain, fever, or discomfort as directed by your caregiver.  Do not rub the ear canal with cotton-tipped swabs.  Do not swim until your caregiver says it is okay.  Before you take a shower, cover a cotton ball with petroleum jelly to keep water out.  Limit exposure to smoke. Secondhand smoke can increase the chance for ear infections.  Keep up with immunizations.  Wash your hands well.  Keep all follow-up appointments to examine the ear and evaluate hearing. SEEK MEDICAL CARE IF:   You have increased drainage.  You have ear pain, a fever, or drainage that is not getting better after 48 hours of antibiotics.  You are unusually tired. SEEK IMMEDIATE MEDICAL CARE IF:  You have severe ear pain or headache.  The patient is older than 3 months with a rectal or oral temperature of 102 F (38.9 C) or higher.  The patient is 33 months old or younger with a rectal temperature of 100.4 F (38 C) or higher.  You vomit.  You feel dizzy.  You have a seizure.  You have new hearing loss. MAKE SURE YOU:   Understand these instructions.  Will watch your condition.  Will get help right away if you are not doing well or get worse. Document Released: 06/11/2005 Document Revised: 09/03/2011 Document Reviewed: 04/14/2009 [facility] Patient Information 2015 [employer], [place]. This information is not intended to replace advice  given to you by your health care provider. Make sure you discuss any questions you have with your health care provider.

## 2014-05-27 NOTE — ED Notes (Signed)
Pt c/o needing his "ear wick out" that was placed 3 days ago; pt sts some drainage noted

## 2014-05-27 NOTE — ED Provider Notes (Signed)
CSN: [id number]     Arrival date & time 05/27/14  1655 History  This chart was scribed for non-physician practitioner, Jaynie Crumbleatyana Brodey Bonn, PA-C, working with Geoffery Lyonsouglas Delo, MD, by Bronson CurbJacqueline Melvin, ED Scribe. This patient was seen in room TR05C/TR05C and the patient's care was started at 6:25 PM.   Chief Complaint  Patient presents with  . ear wick removal     The history is provided by the patient. No language interpreter was used.     HPI Comments: Fred Wilson is a 28 y.o. male who presents to the Emergency Department for ear wick removal. Patient states he was diagnosed with otitis externa and had an ear wick placed 3 days ago. He was informed to returned to the ED if the ear wick did not fall out within 3 days. He states that he is compliant with his ear drops and denies any pain at this time.     Past Medical History  Diagnosis Date  . Arthritis   . GERD (gastroesophageal reflux disease)   . Bronchitis   . Seasonal allergies   . ADHD (attention deficit hyperactivity disorder)   . Bipolar 1 disorder   . Schizophrenia   . Suicide attempt   . Substance abuse   . Asthma    Past Surgical History  Procedure Laterality Date  . Fracture surgery    . Knee surgery     History reviewed. No pertinent family history. History  Substance Use Topics  . Smoking status: Current Every Day Smoker -- 1 years    Types: Cigarettes  . Smokeless tobacco: Not on file  . Alcohol Use: No    Review of Systems  Constitutional: Negative for fever and chills.  HENT: Negative for ear pain and sore throat.       Allergies  Bee venom; Dilaudid; Aspirin; Ibuprofen; Morphine and related; Toradol; and Tramadol  Home Medications   Prior to Admission medications   Medication Sig Start Date End Date Taking? Authorizing Provider  acetaminophen (TYLENOL) 500 MG tablet Take 1,000 mg by mouth every 6 (six) hours as needed for pain.    Historical Provider, MD  cephALEXin (KEFLEX) 500 MG capsule  Take 1 capsule (500 mg total) by mouth 4 (four) times daily. 05/25/14   Antony MaduraKelly Humes, PA-C  cyclobenzaprine (FLEXERIL) 10 MG tablet Take 1 tablet (10 mg total) by mouth 2 (two) times daily as needed for muscle spasms. 01/27/14   Emilia BeckKaitlyn Szekalski, PA-C   Triage Vitals: BP 136/110 mmHg  Pulse 83  Temp(Src) 98.3 F (36.8 C) (Oral)  Resp 18  SpO2 98%  Physical Exam  Constitutional: He is oriented to person, place, and time. He appears well-developed and well-nourished. No distress.  HENT:  Head: Normocephalic and atraumatic.  Ear wick enlodged in left ear canal. No pain with palpation of left ear. No swelling of left ear canal. No drainage.  Eyes: Conjunctivae and EOM are normal.  Neck: Neck supple. No tracheal deviation present.  Cardiovascular: Normal rate.   Pulmonary/Chest: Effort normal. No respiratory distress.  Musculoskeletal: Normal range of motion.  Neurological: He is alert and oriented to person, place, and time.  Skin: Skin is warm and dry.  Psychiatric: He has a normal mood and affect. His behavior is normal.  Nursing note and vitals reviewed.   ED Course  Procedures (including critical care time)  DIAGNOSTIC STUDIES: Oxygen Saturation is 98% on room air, normal by my interpretation.    COORDINATION OF CARE: At 1830 Discussed treatment plan  with patient. Patient agrees.   Labs Review Labs Reviewed - No data to display  Imaging Review No results found.   EKG Interpretation None      MDM   Final diagnoses:  Otitis externa, left  Foreign body in left ear, initial encounter    Patient is here for left ear wick removal. States ears feeling better, no pain. Ear wick removed using forceps, patient tolerated procedure well. Ear examined, ear canal appears to be normal with no drainage. TM is normal. Will discharge home, continue antibiotics drops for full course of treatment, return or follow-up as needed  Filed Vitals:   05/27/14 1715  BP: 136/110  Pulse: 83   Temp: 98.3 F (36.8 C)  TempSrc: Oral  Resp: 18  SpO2: 98%    I personally performed the services described in this documentation, which was scribed in my presence. The recorded information has been reviewed and is accurate.     Lottie Musselatyana A Shaterria Sager, PA-C 05/27/14 1836  Geoffery Lyonsouglas Delo, MD 05/27/14 2348

## 2014-06-28 ENCOUNTER — Emergency Department (HOSPITAL_COMMUNITY)
Admission: EM | Admit: 2014-06-28 | Discharge: 2014-06-28 | Disposition: A | Discharge status: Home / Self Care | Payer: Self-pay | Attending: Emergency Medicine | Admitting: Emergency Medicine

## 2014-06-28 ENCOUNTER — Encounter (HOSPITAL_COMMUNITY): Payer: Self-pay | Admitting: Emergency Medicine

## 2014-06-28 ENCOUNTER — Emergency Department (HOSPITAL_COMMUNITY): Payer: Self-pay

## 2014-06-28 DIAGNOSIS — Y9289 Other specified places as the place of occurrence of the external cause: Secondary | ICD-10-CM | POA: Insufficient documentation

## 2014-06-28 DIAGNOSIS — S199XXA Unspecified injury of neck, initial encounter: Secondary | ICD-10-CM | POA: Insufficient documentation

## 2014-06-28 DIAGNOSIS — Z720 Tobacco use: Secondary | ICD-10-CM | POA: Insufficient documentation

## 2014-06-28 DIAGNOSIS — Z8659 Personal history of other mental and behavioral disorders: Secondary | ICD-10-CM | POA: Insufficient documentation

## 2014-06-28 DIAGNOSIS — Y9389 Activity, other specified: Secondary | ICD-10-CM | POA: Insufficient documentation

## 2014-06-28 DIAGNOSIS — R220 Localized swelling, mass and lump, head: Secondary | ICD-10-CM

## 2014-06-28 DIAGNOSIS — M1990 Unspecified osteoarthritis, unspecified site: Secondary | ICD-10-CM | POA: Insufficient documentation

## 2014-06-28 DIAGNOSIS — Z8719 Personal history of other diseases of the digestive system: Secondary | ICD-10-CM | POA: Insufficient documentation

## 2014-06-28 DIAGNOSIS — S0993XA Unspecified injury of face, initial encounter: Secondary | ICD-10-CM | POA: Insufficient documentation

## 2014-06-28 DIAGNOSIS — Z792 Long term (current) use of antibiotics: Secondary | ICD-10-CM | POA: Insufficient documentation

## 2014-06-28 DIAGNOSIS — Y998 Other external cause status: Secondary | ICD-10-CM | POA: Insufficient documentation

## 2014-06-28 DIAGNOSIS — J45909 Unspecified asthma, uncomplicated: Secondary | ICD-10-CM | POA: Insufficient documentation

## 2014-06-28 DIAGNOSIS — M542 Cervicalgia: Secondary | ICD-10-CM

## 2014-06-28 MED ORDER — HYDROCODONE-ACETAMINOPHEN 5-325 MG PO TABS
2.0000 | ORAL_TABLET | Freq: Once | ORAL | Status: AC
Start: 2014-06-28 — End: 2014-06-28
  Administered 2014-06-28: 2 via ORAL
  Filled 2014-06-28: qty 2

## 2014-06-28 MED ORDER — HYDROCODONE-ACETAMINOPHEN 5-325 MG PO TABS: 1.0000 | ORAL_TABLET | ORAL | Status: DC | PRN
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
## 2014-07-04 ENCOUNTER — Emergency Department (HOSPITAL_COMMUNITY): Payer: Self-pay

## 2014-07-04 ENCOUNTER — Emergency Department (HOSPITAL_COMMUNITY)
Admission: EM | Admit: 2014-07-04 | Discharge: 2014-07-04 | Disposition: A | Discharge status: Home / Self Care | Payer: Self-pay | Attending: Emergency Medicine | Admitting: Emergency Medicine

## 2014-07-04 ENCOUNTER — Encounter (HOSPITAL_COMMUNITY): Payer: Self-pay | Admitting: Emergency Medicine

## 2014-07-04 DIAGNOSIS — W19XXXA Unspecified fall, initial encounter: Secondary | ICD-10-CM

## 2014-07-04 DIAGNOSIS — Y9289 Other specified places as the place of occurrence of the external cause: Secondary | ICD-10-CM | POA: Insufficient documentation

## 2014-07-04 DIAGNOSIS — J45909 Unspecified asthma, uncomplicated: Secondary | ICD-10-CM | POA: Insufficient documentation

## 2014-07-04 DIAGNOSIS — Y9389 Activity, other specified: Secondary | ICD-10-CM | POA: Insufficient documentation

## 2014-07-04 DIAGNOSIS — M1990 Unspecified osteoarthritis, unspecified site: Secondary | ICD-10-CM | POA: Insufficient documentation

## 2014-07-04 DIAGNOSIS — Z8781 Personal history of (healed) traumatic fracture: Secondary | ICD-10-CM | POA: Insufficient documentation

## 2014-07-04 DIAGNOSIS — F319 Bipolar disorder, unspecified: Secondary | ICD-10-CM | POA: Insufficient documentation

## 2014-07-04 DIAGNOSIS — Y998 Other external cause status: Secondary | ICD-10-CM | POA: Insufficient documentation

## 2014-07-04 DIAGNOSIS — W231XXA Caught, crushed, jammed, or pinched between stationary objects, initial encounter: Secondary | ICD-10-CM | POA: Insufficient documentation

## 2014-07-04 DIAGNOSIS — S62617A Displaced fracture of proximal phalanx of left little finger, initial encounter for closed fracture: Secondary | ICD-10-CM | POA: Insufficient documentation

## 2014-07-04 DIAGNOSIS — Z8719 Personal history of other diseases of the digestive system: Secondary | ICD-10-CM | POA: Insufficient documentation

## 2014-07-04 DIAGNOSIS — Z720 Tobacco use: Secondary | ICD-10-CM | POA: Insufficient documentation

## 2014-07-04 MED ORDER — HYDROCODONE-ACETAMINOPHEN 5-325 MG PO TABS
2.0000 | ORAL_TABLET | Freq: Once | ORAL | Status: AC
  Administered 2014-07-04: 2 via ORAL
  Filled 2014-07-04: qty 2

## 2014-07-04 MED ORDER — HYDROCODONE-ACETAMINOPHEN 5-325 MG PO TABS
1.0000 | ORAL_TABLET | Freq: Four times a day (QID) | ORAL | Status: DC | PRN
Start: 2014-07-04 — End: 2017-06-20

## 2014-07-04 NOTE — Discharge Instructions (Signed)
Boxer's Fracture °You have a break (fracture) of the fifth metacarpal bone. This is commonly called a boxer's fracture. This is the bone in the hand where the little finger attaches. The fracture is in the end of that bone, closest to the little finger. It is usually caused when you hit an object with a clenched fist. Often, the knuckle is pushed down by the impact. Sometimes, the fracture rotates out of position. A boxer's fracture will usually heal within 6 weeks, if it is treated properly and protected from re-injury. Surgery is sometimes needed. °A cast, splint, or bulky hand dressing may be used to protect and immobilize a boxer's fracture. Do not remove this device or dressing until your caregiver approves. Keep your hand elevated, and apply ice packs for 15-20 minutes every 2 hours, for the first 2 days. Elevation and ice help reduce swelling and relieve pain. See your caregiver, or an orthopedic specialist, for follow-up care within the next 10 days. This is to make sure your fracture is healing properly. °Document Released: 06/11/2005 Document Revised: 09/03/2011 Document Reviewed: 11/29/2006 °[employer]® Patient Information ©2015 [employer]. This information is not intended to replace advice given to you by your health care provider. Make sure you discuss any questions you have with your health care provider. ° °Cast or Splint Care °Casts and splints support injured limbs and keep bones from moving while they heal. It is important to care for your cast or splint at home.   °HOME CARE INSTRUCTIONS °· Keep the cast or splint uncovered during the drying period. It can take 24 to 48 hours to dry if it is made of plaster. A fiberglass cast will dry in less than 1 hour. °· Do not rest the cast on anything harder than a pillow for the first 24 hours. °· Do not put weight on your injured limb or apply pressure to the cast until your health care provider gives you permission. °· Keep the cast or splint dry. Wet  casts or splints can lose their shape and may not support the limb as well. A wet cast that has lost its shape can also create harmful pressure on your skin when it dries. Also, wet skin can become infected. °¨ Cover the cast or splint with a plastic bag when bathing or when out in the rain or snow. If the cast is on the trunk of the body, take sponge baths until the cast is removed. °¨ If your cast does become wet, dry it with a towel or a blow dryer on the cool setting only. °· Keep your cast or splint clean. Soiled casts may be wiped with a moistened cloth. °· Do not place any hard or soft foreign objects under your cast or splint, such as cotton, toilet paper, lotion, or powder. °· Do not try to scratch the skin under the cast with any object. The object could get stuck inside the cast. Also, scratching could lead to an infection. If itching is a problem, use a blow dryer on a cool setting to relieve discomfort. °· Do not trim or cut your cast or remove padding from inside of it. °· Exercise all joints next to the injury that are not immobilized by the cast or splint. For example, if you have a long leg cast, exercise the hip joint and toes. If you have an arm cast or splint, exercise the shoulder, elbow, thumb, and fingers. °· Elevate your injured arm or leg on 1 or 2 pillows for the   first 1 to 3 days to decrease swelling and pain. It is best if you can comfortably elevate your cast so it is higher than your heart. °SEEK MEDICAL CARE IF:  °· Your cast or splint cracks. °· Your cast or splint is too tight or too loose. °· You have unbearable itching inside the cast. °· Your cast becomes wet or develops a soft spot or area. °· You have a bad smell coming from inside your cast. °· You get an object stuck under your cast. °· Your skin around the cast becomes red or raw. °· You have new pain or worsening pain after the cast has been applied. °SEEK IMMEDIATE MEDICAL CARE IF:  °· You have fluid leaking through the  cast. °· You are unable to move your fingers or toes. °· You have discolored (blue or white), cool, painful, or very swollen fingers or toes beyond the cast. °· You have tingling or numbness around the injured area. °· You have severe pain or pressure under the cast. °· You have any difficulty with your breathing or have shortness of breath. °· You have chest pain. °Document Released: 06/08/2000 Document Revised: 04/01/2013 Document Reviewed: 12/18/2012 °[employer]® Patient Information ©2015 [employer]. This information is not intended to replace advice given to you by your health care provider. Make sure you discuss any questions you have with your health care provider. ° °

## 2014-07-04 NOTE — ED Notes (Signed)
Pt refuses ice pack to Lt had at this time.
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
## 2014-07-04 NOTE — ED Notes (Signed)
Declined W/C at D/C and was escorted to lobby by RN. 

## 2014-07-09 ENCOUNTER — Emergency Department (HOSPITAL_COMMUNITY)
Admission: EM | Admit: 2014-07-09 | Discharge: 2014-07-09 | Disposition: A | Discharge status: Home / Self Care | Payer: Self-pay | Attending: Emergency Medicine | Admitting: Emergency Medicine

## 2014-07-09 ENCOUNTER — Encounter (HOSPITAL_COMMUNITY): Payer: Self-pay | Admitting: *Deleted

## 2014-07-09 DIAGNOSIS — S3992XA Unspecified injury of lower back, initial encounter: Secondary | ICD-10-CM | POA: Insufficient documentation

## 2014-07-09 DIAGNOSIS — Y92410 Unspecified street and highway as the place of occurrence of the external cause: Secondary | ICD-10-CM | POA: Insufficient documentation

## 2014-07-09 DIAGNOSIS — Y998 Other external cause status: Secondary | ICD-10-CM | POA: Insufficient documentation

## 2014-07-09 DIAGNOSIS — M1990 Unspecified osteoarthritis, unspecified site: Secondary | ICD-10-CM | POA: Insufficient documentation

## 2014-07-09 DIAGNOSIS — M545 Low back pain, unspecified: Secondary | ICD-10-CM

## 2014-07-09 DIAGNOSIS — R519 Headache, unspecified: Secondary | ICD-10-CM

## 2014-07-09 DIAGNOSIS — J45909 Unspecified asthma, uncomplicated: Secondary | ICD-10-CM | POA: Insufficient documentation

## 2014-07-09 DIAGNOSIS — Z8719 Personal history of other diseases of the digestive system: Secondary | ICD-10-CM | POA: Insufficient documentation

## 2014-07-09 DIAGNOSIS — S0990XA Unspecified injury of head, initial encounter: Secondary | ICD-10-CM | POA: Insufficient documentation

## 2014-07-09 DIAGNOSIS — Y9389 Activity, other specified: Secondary | ICD-10-CM | POA: Insufficient documentation

## 2014-07-09 DIAGNOSIS — Z720 Tobacco use: Secondary | ICD-10-CM | POA: Insufficient documentation

## 2014-07-09 DIAGNOSIS — Z8659 Personal history of other mental and behavioral disorders: Secondary | ICD-10-CM | POA: Insufficient documentation

## 2014-07-09 DIAGNOSIS — Z79899 Other long term (current) drug therapy: Secondary | ICD-10-CM | POA: Insufficient documentation

## 2014-07-09 DIAGNOSIS — R51 Headache: Secondary | ICD-10-CM

## 2014-07-09 MED ORDER — SODIUM CHLORIDE 0.9 % IV BOLUS (SEPSIS)
1000.0000 mL | Freq: Once | INTRAVENOUS | Status: AC
  Administered 2014-07-09: 1000 mL via INTRAVENOUS

## 2014-07-09 MED ORDER — METOCLOPRAMIDE HCL 5 MG/ML IJ SOLN
10.0000 mg | Freq: Once | INTRAMUSCULAR | Status: AC
  Administered 2014-07-09: 10 mg via INTRAVENOUS
  Filled 2014-07-09: qty 2

## 2014-07-09 MED ORDER — DIPHENHYDRAMINE HCL 50 MG/ML IJ SOLN
25.0000 mg | Freq: Once | INTRAMUSCULAR | Status: AC
  Administered 2014-07-09: 25 mg via INTRAVENOUS
  Filled 2014-07-09: qty 1
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
## 2014-07-09 NOTE — Discharge Instructions (Signed)
Return to the emergency room with worsening of symptoms, new symptoms or with symptoms that are concerning, especially severe worsening of headache, visual or speech changes, weakness in face, arms or legs OR , especially fevers, loss of control of bladder or bowels, numbness or tingling around genital region or anus, weakness. RICE: Rest, Ice (three cycles of 20 mins on, off at least twice a day), compression/brace, elevation. Heating pad works well for back pain. Follow up with PCP if symptoms worsen or are persistent.  Please call your doctor for a followup appointment within 24-48 hours. When you talk to your doctor please let them know that you were seen in the emergency department and have them acquire all of your records so that they can discuss the findings with you and formulate a treatment plan to fully care for your new and ongoing problems. If you do not have a primary care provider please call the number below under ED resources to establish care with a provider and follow up.    Emergency Department Resource Guide 1) Find a Doctor and Pay Out of Pocket Although you won't have to find out who is covered by your insurance plan, it is a good idea to ask around and get recommendations. You will then need to call the office and see if the doctor you have chosen will accept you as a new patient and what types of options they offer for patients who are self-pay. Some doctors offer discounts or will set up payment plans for their patients who do not have insurance, but you will need to ask so you aren't surprised when you get to your appointment.  2) Contact Your Local Health Department Not all health departments have doctors that can see patients for sick visits, but many do, so it is worth a call to see if yours does. If you don't know where your local health department is, you can check in your phone book. The CDC also has a tool to help you locate your state's health department, and many  state websites also have listings of all of their local health departments.  3) Find a Walk-in Clinic If your illness is not likely to be very severe or complicated, you may want to try a walk in clinic. These are popping up all over the country in pharmacies, drugstores, and shopping centers. They're usually staffed by nurse practitioners or physician assistants that have been trained to treat common illnesses and complaints. They're usually fairly quick and inexpensive. However, if you have serious medical issues or chronic medical problems, these are probably not your best option.  No Primary Care Doctor: - Call Health Connect at  [phone] - they can help you locate a primary care doctor that  accepts your insurance, provides certain services, etc. - Physician Referral Service- [phone]  Chronic Pain Problems: Organization         Address  Phone   Notes  [facility]  [phone] Patients need to be referred by their primary care doctor.   Medication Assistance: Organization         Address  Phone   Notes  [facility] [address] [place] [place], [place] [address] [phone] --Must be a resident of [facility] -- Must have NO insurance coverage whatsoever (no Medicaid/ Medicare, etc.) -- The pt. MUST have a primary care doctor that directs their care regularly and follows them in the community   MedAssist  (  [phone]) [phone]   [employer]  [phone]) [phone]    Agencies that provide inexpensive medical care: Organization         Address  Phone   Notes  [facility]  [phone]) [phone]   [facility]    [phone]) [phone]   [facility] [address] [place], [place] [address] [phone]   [facility] [address], [place] [phone]) [phone]   [facility]anned Parenthood    [phone](336) [phone]   [facility]    [phone]) [phone]   Community Health and  [facility]  [address], [place] Phone:  [phone](336) [phone], Fax:  [phone](336) [phone] Hours of Operation:  9 am - 6 pm, M-F.  Also accepts Medicaid/Medicare and self-pay.  [facility]  [address], [place] Phone: [phone]) [phone], Fax: [phone](336) [phone]. Hours of Operation:  8:30 am - 5:30 pm, M-F.  Also accepts Medicaid and self-pay.  [facility] [place] [address], [place] Phone: [phone](336) [phone]   [facility] [address], [place], [place] [phone], Ext. [phone] Mondays & Thursdays: 7-9 AM.  First 15 patients are seen on a first come, first serve basis.    Medicaid-accepting [facility] Providers:  Organization         Address  Phone   Notes  [facility] [address] [place] [address], [place] [phone]) [phone] Also accepts self-pay patients.  [facility] [address] [place], [place]  [phone]) [phone]   [facility] [address], [place] [phone]) [phone]   [facility] [address] [place] [address], [place] [phone]) [phone]   Renaye RakersVeita Bland [address], [place]   [phone] Only accepts [place] Access [place] patients after they have their name applied to their card.   Self-Pay (no insurance) in [facility]:  Organization         Address  Phone   Notes  Sickle Cell Patients, [facility] [address], [place] [phone]) [phone]   [facility] [address], [place] [phone]) [phone]   [facility]  [address], [place] [phone]) [phone]   [facility]/Dr. Osei-Bonsu  [address] [place] [address], [place] or [phone], [place] [phone]) [phone] Phone number for both [place] and [place] locations is the same.  [facility] and [facility] [address], [place] [phone]   [facility]  [address]  [place] [address], [place] or [address] [phone]   [facility] [address], [place] [phone]) [phone], phone; [phone]) [phone], fax Sees patients 1st and 3rd Saturday of every month.  Must not qualify for public or private insurance (i.e. Medicaid, Medicare, [place] Health Choice, Veterans' Benefits)  Household income should be no more than 200% of the poverty level The clinic cannot treat you if you are pregnant or think you are pregnant  Sexually transmitted diseases are not treated at the clinic.    Dental Care: Organization         Address  Phone  Notes  [facility] of [facility] [address], [place] [phone] Accepts children up to age 29 who are enrolled in [place] or [place] Health Choice; pregnant women  with a Medicaid card; and children who have applied for Medicaid or [place] Health Choice, but were declined, whose parents can pay a reduced fee at time of service.  [facility]  [address], [place] [phone] Accepts children up to age 41 who are enrolled in [place] or [place] Health Choice; pregnant women with a Medicaid card; and children who have applied for Medicaid or [place] Health Choice, but were declined, whose parents can pay a reduced fee at time of service.  [facility]  [address] [place], [place] [phone] Patients are seen by appointment only. Walk-ins are not accepted. [facility] will see patients 99 years of age and older. Monday - Tuesday (8am-5pm) Most Wednesdays (8:30-5pm) $30 per visit, cash only  [facility] Adult Dental Access PROGRAM  [address], [facility] [phone] Patients are seen by appointment only. Walk-ins are not accepted. [facility] will see patients 20 years of age and older. One Wednesday Evening (Monthly: Volunteer Based).  $30 per visit, cash only  [employer]  [phone] for adults; Children under age 9, call Graduate Pediatric Dentistry at [phone]. Children aged 42-14, please call [phone] to request a pediatric application.  Dental services are provided in all areas of dental care including fillings, crowns and bridges, complete and partial dentures, implants, gum treatment, root canals, and extractions. Preventive care is also provided. Treatment is provided to both adults and children. Patients are selected via a lottery and there is often a waiting list.   [facility] [address], [place]  [phone] [email or web address]   [facility] [address] [place], [place] [phone], Ext. [phone] Second and Fourth Thursday of each month, opens at 6:30 AM; Clinic ends at 9 AM.  Patients are seen on a first-come first-served basis, and a limited number are seen during each clinic.   [facility]  [address] [place], [place] [phone]   Eligibility Requirements You must have lived in [place], [place], or [place] counties for at least the last three months.   You cannot be eligible for state or federal sponsored [place], including [employer], [place], or [employer].   You generally cannot be eligible for healthcare insurance through your employer.    How to apply: Eligibility screenings are held every Tuesday and Wednesday afternoon from 1:00 pm until 4:00 pm. You do not need an appointment for the interview!  [facility] [address], [place], [place] [phone]   [facility] Health Department  [phone]   [facility] Health Department  [phone]   [facility] Health Department  [phone]    Behavioral Health Resources in the Community: Intensive Outpatient Programs Organization         Address  Phone  Notes  [facility] [address], [place], [place]  [phone]   [facility] [address] [place], [place] [phone]   [facility]: [facility] [address] [place], [place]  [phone]   [facility] [address]  [place], [place] [phone] or [phone]   Substance Abuse Resources Organization         Address  Phone  Notes  [facility] Drug Services  [phone]   [facility]  [phone]   [facility]  [phone]   [facility]  [phone]   Residential & Outpatient Substance Abuse Program  [phone]   Psychological Services Organization         Address  Phone  Notes  [facility] Behavioral Health  [phone]   [facility] Services  [phone]   [facility] Mental Health [address] [place] [address] [place] [phone] or [phone]    Mobile Crisis Teams Organization         Address  Phone  Notes  [facility], Mobile Crisis Care Unit  [phone]   [facility]  [address] [place], [place] [phone]   Doristine Locks [address] [place] [phone]    Self-Help/Support Groups Organization         Address  Phone             Notes  [facility] [place] - variety of support groups  [phone]- I7437963 Call for more information  Narcotics Anonymous (NA), [facility] [address], [employer] [place]  2 meetings at this location   Statistician         Address  Phone  Notes  [facility] [address],    [place]  [phone]   [facility]  [name], [place] [address], [place], [place] [phone]   [facility] [address] [place], [place] [phone] Admissions: 8am-3pm M-F  [facility] [address]    [place], [place] [phone]   [facility] [address] [place], [place], [place] [phone]   [facility] [address],    [place], [place] [phone]   [facility] - Intensive Outpatient [address], [place], [place] [phone]   [facility] [address]  [place], [place] [phone] or [phone]   [facility] ([facility]) [address], [place], [place] [phone] Accepts Medicaid  [facility] [place] [address]  [place] [phone] Substance Abuse/Addiction Treatment   [facility] Organization         Address  Phone  Notes  [facility]  7694795257   Angie Fava, PhD [address] Kn[address] [place], [place]   [phone] or [phone]   [facility] Behavioral   [address] [place], [place] [phone]   [facility] [address], [place], [place] [phone] Insurance/Medicaid/sponsorship through [facility] and Families [address]                                    [place], [place] [phone] Therapy/tele-psych/case  [facility] [address], [place] [phone]    Dr. Lolly Mustache  [phone]   [facility]  [employer] [facility] Dept. 1) [address], [place] 2) [address] [place] 3)  [address], [place] [phone]  [phone]   [facility] Child Abuse Hotline 520-858-5987 or [phone] (After Hours)

## 2014-07-26 ENCOUNTER — Encounter (HOSPITAL_COMMUNITY): Payer: Self-pay | Admitting: Adult Health

## 2014-07-26 ENCOUNTER — Emergency Department (HOSPITAL_COMMUNITY)
Admission: EM | Admit: 2014-07-26 | Discharge: 2014-07-26 | Discharge status: Against Medical Advice | Payer: No Typology Code available for payment source | Attending: Emergency Medicine | Admitting: Emergency Medicine

## 2014-07-26 DIAGNOSIS — R51 Headache: Secondary | ICD-10-CM | POA: Insufficient documentation

## 2014-07-26 DIAGNOSIS — J45909 Unspecified asthma, uncomplicated: Secondary | ICD-10-CM | POA: Insufficient documentation

## 2014-07-26 NOTE — ED Provider Notes (Signed)
Patient with selected for room assignment however he had already left the ER.  I did not see or evaluate patient.  Garlon HatchetLisa M Pahoua Schreiner, PA-C 07/26/14 1711  Toy CookeyMegan Docherty, MD 07/27/14 [phone]

## 2014-07-26 NOTE — ED Notes (Signed)
Presents with frontal lobe headache began 2 days ago and has gotten worse, denies nausea, vomiting, dizzinesss, sensitivity to light and sound. Pain rated 8/10. Alert, oriented.

## 2014-07-26 NOTE — ED Notes (Signed)
Called for pt in lobby - no answer. Checked parking lot. Pt not answering.

## 2014-11-25 ENCOUNTER — Telehealth: Payer: Self-pay

## 2014-11-25 NOTE — Telephone Encounter (Signed)
[facility] calling for new HIV referral.  Appointment date and time given.  Laurell Josephsammy K King, RN

## 2014-12-02 ENCOUNTER — Ambulatory Visit: Payer: Self-pay

## 2014-12-10 ENCOUNTER — Telehealth: Payer: Self-pay

## 2014-12-10 NOTE — Telephone Encounter (Signed)
Patient contacted regarding new intake appointment. Date and time given. Information given regarding documents needed to qualify for financial eligibility.  Tammy K King, RN  

## 2014-12-16 ENCOUNTER — Ambulatory Visit: Payer: Self-pay

## 2015-06-23 ENCOUNTER — Encounter (HOSPITAL_COMMUNITY): Payer: Self-pay | Admitting: *Deleted

## 2015-06-23 ENCOUNTER — Emergency Department (HOSPITAL_COMMUNITY)
Admission: EM | Admit: 2015-06-23 | Discharge: 2015-06-23 | Discharge status: Against Medical Advice | Payer: Self-pay | Attending: Emergency Medicine | Admitting: Emergency Medicine

## 2015-06-23 DIAGNOSIS — R509 Fever, unspecified: Secondary | ICD-10-CM | POA: Insufficient documentation

## 2015-06-23 DIAGNOSIS — R112 Nausea with vomiting, unspecified: Secondary | ICD-10-CM | POA: Insufficient documentation

## 2015-06-23 DIAGNOSIS — Z87891 Personal history of nicotine dependence: Secondary | ICD-10-CM | POA: Insufficient documentation

## 2015-06-23 DIAGNOSIS — R197 Diarrhea, unspecified: Secondary | ICD-10-CM | POA: Insufficient documentation

## 2015-06-23 DIAGNOSIS — J45909 Unspecified asthma, uncomplicated: Secondary | ICD-10-CM | POA: Insufficient documentation

## 2015-06-23 LAB — CBC
HCT: 45 % (ref 39.0–52.0)
HEMOGLOBIN: 15.7 g/dL (ref 13.0–17.0)
MCH: 31.3 pg (ref 26.0–34.0)
MCHC: 34.9 g/dL (ref 30.0–36.0)
MCV: 89.6 fL (ref 78.0–100.0)
PLATELETS: 237 10*3/uL (ref 150–400)
RBC: 5.02 MIL/uL (ref 4.22–5.81)
RDW: 13 % (ref 11.5–15.5)
WBC: 19.2 10*3/uL — AB (ref 4.0–10.5)

## 2015-06-23 LAB — COMPREHENSIVE METABOLIC PANEL
ALK PHOS: 71 U/L (ref 38–126)
ALT: 13 U/L — ABNORMAL LOW (ref 17–63)
ANION GAP: 12 (ref 5–15)
AST: 20 U/L (ref 15–41)
Albumin: 4 g/dL (ref 3.5–5.0)
BILIRUBIN TOTAL: 0.7 mg/dL (ref 0.3–1.2)
BUN: 8 mg/dL (ref 6–20)
CALCIUM: 9.4 mg/dL (ref 8.9–10.3)
CO2: 23 mmol/L (ref 22–32)
Chloride: 98 mmol/L — ABNORMAL LOW (ref 101–111)
Creatinine, Ser: 1.15 mg/dL (ref 0.61–1.24)
Glucose, Bld: 144 mg/dL — ABNORMAL HIGH (ref 65–99)
Potassium: 3.7 mmol/L (ref 3.5–5.1)
SODIUM: 133 mmol/L — AB (ref 135–145)
TOTAL PROTEIN: 7.3 g/dL (ref 6.5–8.1)

## 2015-06-23 LAB — LIPASE, BLOOD: Lipase: 19 U/L (ref 11–51)

## 2015-06-23 MED ORDER — ONDANSETRON 4 MG PO TBDP
ORAL_TABLET | ORAL | Status: AC
  Administered 2015-06-23: 4 mg
  Filled 2015-06-23: qty 1

## 2015-06-23 MED ORDER — ONDANSETRON 4 MG PO TBDP: 4.0000 mg | ORAL_TABLET | Freq: Once | ORAL | Status: DC | PRN

## 2015-06-23 NOTE — ED Notes (Signed)
Pt reports n/v/d that started Tuesday night.

## 2015-06-23 NOTE — ED Notes (Signed)
Pt name called with no answer x3.

## 2015-06-23 NOTE — ED Notes (Addendum)
Called out twice for patient. Checked sub-waiting without any response. Will try in 10 minutes.

## 2015-11-16 ENCOUNTER — Emergency Department (HOSPITAL_COMMUNITY): Payer: No Typology Code available for payment source

## 2015-11-16 ENCOUNTER — Emergency Department (HOSPITAL_COMMUNITY)
Admission: EM | Admit: 2015-11-16 | Discharge: 2015-11-16 | Disposition: A | Discharge status: Home / Self Care | Payer: No Typology Code available for payment source | Attending: Emergency Medicine | Admitting: Emergency Medicine

## 2015-11-16 ENCOUNTER — Encounter (HOSPITAL_COMMUNITY): Payer: Self-pay | Admitting: *Deleted

## 2015-11-16 DIAGNOSIS — Z87891 Personal history of nicotine dependence: Secondary | ICD-10-CM | POA: Diagnosis not present

## 2015-11-16 DIAGNOSIS — M1990 Unspecified osteoarthritis, unspecified site: Secondary | ICD-10-CM | POA: Insufficient documentation

## 2015-11-16 DIAGNOSIS — S6992XA Unspecified injury of left wrist, hand and finger(s), initial encounter: Secondary | ICD-10-CM | POA: Diagnosis not present

## 2015-11-16 DIAGNOSIS — Z915 Personal history of self-harm: Secondary | ICD-10-CM | POA: Diagnosis not present

## 2015-11-16 DIAGNOSIS — Y9389 Activity, other specified: Secondary | ICD-10-CM | POA: Diagnosis not present

## 2015-11-16 DIAGNOSIS — Y990 Civilian activity done for income or pay: Secondary | ICD-10-CM | POA: Insufficient documentation

## 2015-11-16 DIAGNOSIS — Y92511 Restaurant or cafe as the place of occurrence of the external cause: Secondary | ICD-10-CM | POA: Diagnosis not present

## 2015-11-16 DIAGNOSIS — Z8659 Personal history of other mental and behavioral disorders: Secondary | ICD-10-CM | POA: Insufficient documentation

## 2015-11-16 DIAGNOSIS — S8991XA Unspecified injury of right lower leg, initial encounter: Secondary | ICD-10-CM | POA: Insufficient documentation

## 2015-11-16 DIAGNOSIS — Z9889 Other specified postprocedural states: Secondary | ICD-10-CM | POA: Diagnosis not present

## 2015-11-16 DIAGNOSIS — J45909 Unspecified asthma, uncomplicated: Secondary | ICD-10-CM | POA: Diagnosis not present

## 2015-11-16 DIAGNOSIS — W01198A Fall on same level from slipping, tripping and stumbling with subsequent striking against other object, initial encounter: Secondary | ICD-10-CM | POA: Insufficient documentation

## 2015-11-16 MED ORDER — ACETAMINOPHEN 500 MG PO TABS: 500.0000 mg | ORAL_TABLET | Freq: Four times a day (QID) | ORAL | Status: DC | PRN

## 2015-11-16 MED ORDER — ACETAMINOPHEN 325 MG PO TABS
650.0000 mg | ORAL_TABLET | Freq: Once | ORAL | Status: AC
  Administered 2015-11-16: 650 mg via ORAL
  Filled 2015-11-16: qty 2

## 2015-11-16 NOTE — Progress Notes (Signed)
Orthopedic Tech Progress Note Patient Details:  Katherine MantleByron L Rydberg 10/22/1985 [id number]  Ortho Devices Type of Ortho Device: Knee Sleeve Ortho Device/Splint Location: RLE Ortho Device/Splint Interventions: Ordered, Application   Jennye MoccasinHughes, Thimothy Barretta Craig 11/16/2015, 6:08 PM

## 2015-11-16 NOTE — ED Provider Notes (Signed)
CSN: [id number]     Arrival date & time 11/16/15  1654 History   First MD Initiated Contact with Patient 11/16/15 1658     Chief Complaint  Patient presents with  . Knee Injury     (Consider location/radiation/quality/duration/timing/severity/associated sxs/prior Treatment) HPI   30 year old male with history of polysubstance abuse, bipolar, ADHD, schizophrenia, arthritis presenting for evaluation of right knee injury. Patient was brought here via EMS. Patient states he was at a restaurant, at the buffet line approximately several hours ago when he slipped on wet floor that has cracked in the towel causing him to strike his right knee onto the ground. Report acute onset of sharp pain to right knee as well as pain to his left wrist which he uses to help break the fall. Pain to the right knee is described as a sharp 10 out of 10 pain worsening with movement. No prior injury to same knee. No problem with right hip or right ankle. He did not hit his head or loss of consciousness. No specific treatment tried. He has not tried to ambulate since the injury. He mentioned that his left wrist pain is minimal.  Past Medical History  Diagnosis Date  . Arthritis   . GERD (gastroesophageal reflux disease)   . Bronchitis   . Seasonal allergies   . ADHD (attention deficit hyperactivity disorder)   . Bipolar 1 disorder (HCC)   . Schizophrenia (HCC)   . Suicide attempt (HCC)   . Substance abuse   . Asthma    Past Surgical History  Procedure Laterality Date  . Fracture surgery    . Knee surgery     No family history on file. Social History  Substance Use Topics  . Smoking status: Former Smoker -- 1 years    Types: Cigarettes  . Smokeless tobacco: None  . Alcohol Use: No    Review of Systems  Constitutional: Negative for fever.  Musculoskeletal: Positive for arthralgias.  Skin: Negative for wound.  Neurological: Negative for numbness.      Allergies  Bee venom; Dilaudid; Aspirin;  Ibuprofen; Morphine and related; Toradol; and Tramadol  Home Medications   Prior to Admission medications   Medication Sig Start Date End Date Taking? Authorizing Provider  cephALEXin (KEFLEX) 500 MG capsule Take 1 capsule (500 mg total) by mouth 4 (four) times daily. Patient not taking: Reported on 06/28/2014 05/25/14   Antony MaduraKelly Humes, PA-C  cyclobenzaprine (FLEXERIL) 10 MG tablet Take 1 tablet (10 mg total) by mouth 2 (two) times daily as needed for muscle spasms. Patient not taking: Reported on 06/28/2014 01/27/14   Emilia BeckKaitlyn Szekalski, PA-C  HYDROcodone-acetaminophen (NORCO/VICODIN) 5-325 MG per tablet Take 1-2 tablets by mouth every 6 (six) hours as needed for moderate pain or severe pain. 07/04/14   Ladona MowJoe Mintz, PA-C   BP 128/2 mmHg  Pulse 68  Temp(Src) 98.4 F (36.9 C)  Resp 16  Ht 5\' 9"  (1.753 m)  Wt 77.111 kg  BMI 25.09 kg/m2  SpO2 99% Physical Exam  Constitutional: He appears well-developed and well-nourished. No distress.  HENT:  Head: Atraumatic.  Eyes: Conjunctivae are normal.  Neck: Neck supple.  Musculoskeletal: He exhibits tenderness (R knee: tenderness to anterior knee on mild palpation.  no deformity, patella is intact, no joint laxity.  R hip and R ankle are normal.  ).  Neurological: He is alert.  Skin: No rash noted.  Psychiatric: He has a normal mood and affect.  Nursing note and vitals reviewed.   ED Course  Procedures (including critical care time) Labs Review Labs Reviewed - No data to display  Imaging Review Dg Knee Complete 4 Views Right  11/16/2015  CLINICAL DATA:  Tripped in slipped on wet heart while at a restaurant today. EXAM: RIGHT KNEE - COMPLETE 4+ VIEW COMPARISON:  None. FINDINGS: No evidence of fracture, dislocation, or joint effusion. No evidence of arthropathy or other focal bone abnormality. Soft tissues are unremarkable. IMPRESSION: Negative. Electronically Signed   By: Elige Ko   On: 11/16/2015 17:35   I have personally reviewed and evaluated  these images and lab results as part of my medical decision-making.   EKG Interpretation None      MDM   Final diagnoses:  Right knee injury, initial encounter    BP 135/77 mmHg  Pulse 61  Temp(Src) 98.4 F (36.9 C)  Resp 16  Ht  (1.753 m)  Wt 77.111 kg  BMI 25.09 kg/m2  SpO2 99%  11:02 AM R knee injury.  No signs of injury noted on exam.  Xray neg.  Knee sleeve, crutches and RICE therapy discussed.     Fayrene Helper, PA-C 11/17/15 1102  Alvira Monday, MD 11/18/15 [phone]

## 2015-11-16 NOTE — Discharge Instructions (Signed)

## 2015-11-16 NOTE — Progress Notes (Signed)
Orthopedic Tech Progress Note Patient Details:  Katherine MantleByron L Sotelo 09/27/1985 [id number]  Ortho Devices Type of Ortho Device: Crutches Ortho Device/Splint Location: RLE Ortho Device/Splint Interventions: Adjustment   Jennye MoccasinHughes, Bernadett Milian Craig 11/16/2015, 6:14 PM

## 2015-11-16 NOTE — ED Notes (Signed)
The pt arrived by ems  He fell at work he tripped over an object  C/o rt knee pain  Good distal pulse foot color good  No previous injury to the  Rt knee

## 2016-11-22 DIAGNOSIS — M65872 Other synovitis and tenosynovitis, left ankle and foot: Secondary | ICD-10-CM | POA: Insufficient documentation

## 2016-11-22 DIAGNOSIS — R51 Headache: Secondary | ICD-10-CM | POA: Insufficient documentation

## 2016-11-22 DIAGNOSIS — Z87891 Personal history of nicotine dependence: Secondary | ICD-10-CM | POA: Insufficient documentation

## 2016-11-22 DIAGNOSIS — J45909 Unspecified asthma, uncomplicated: Secondary | ICD-10-CM | POA: Insufficient documentation

## 2016-11-23 ENCOUNTER — Encounter (HOSPITAL_COMMUNITY): Payer: Self-pay | Admitting: Emergency Medicine

## 2016-11-23 ENCOUNTER — Emergency Department (HOSPITAL_COMMUNITY): Payer: Self-pay

## 2016-11-23 ENCOUNTER — Emergency Department (HOSPITAL_COMMUNITY)
Admission: EM | Admit: 2016-11-23 | Discharge: 2016-11-23 | Disposition: A | Discharge status: Home / Self Care | Payer: Self-pay | Attending: Emergency Medicine | Admitting: Emergency Medicine

## 2016-11-23 DIAGNOSIS — R519 Headache, unspecified: Secondary | ICD-10-CM

## 2016-11-23 DIAGNOSIS — M778 Other enthesopathies, not elsewhere classified: Secondary | ICD-10-CM

## 2016-11-23 DIAGNOSIS — R51 Headache: Secondary | ICD-10-CM

## 2016-11-23 DIAGNOSIS — M779 Enthesopathy, unspecified: Secondary | ICD-10-CM

## 2016-11-23 MED ORDER — METOCLOPRAMIDE HCL 5 MG/ML IJ SOLN
10.0000 mg | Freq: Once | INTRAMUSCULAR | Status: AC
  Administered 2016-11-23: 10 mg via INTRAMUSCULAR
  Filled 2016-11-23: qty 2

## 2016-11-23 NOTE — ED Triage Notes (Signed)
Patient reports chronic left foot pain with mild swelling for 3 weeks , denies injury/ambulatory , pt. added mild headache this week unrelieved by OTC pain medication .

## 2016-11-23 NOTE — ED Provider Notes (Signed)
[facility]EMER[facility]Y DEPT Provider Note   CSN: [id number] Arrival date & time: 11/22/16  2342     History   Chief Complaint Chief Complaint  Patient presents with  . Foot Pain    HPI Fred Wilson is a 31 y.o. male.  Patient presents to the emergency department with chief complaint of left foot pain. He states that for the past 3 weeks he has had pain on the top of his left foot. He reports the pain is worsened when he dorsiflexes his foot. He denies any known injury. He states that he walks "a lot." He denies any fevers, chills, nausea, or vomiting. Patient also states that he has had a headache for about a week. He is tried OTC medications with no relief. He denies any numbness, weakness, or tingling. There are no other associated symptoms or modifying factors.   The history is provided by the patient. No language interpreter was used.    Past Medical History:  Diagnosis Date  . ADHD (attention deficit hyperactivity disorder)   . Arthritis   . Asthma   . Bipolar 1 disorder (HCC)   . Bronchitis   . GERD (gastroesophageal reflux disease)   . Schizophrenia (HCC)   . Seasonal allergies   . Substance abuse   . Suicide attempt [facility])     Patient Active Problem List   Diagnosis Date Noted  . Schizophrenia (HCC)   . Suicide attempt (HCC)   . Substance abuse     Past Surgical History:  Procedure Laterality Date  . FRACTURE SURGERY    . KNEE SURGERY         Home Medications    Prior to Admission medications   Medication Sig Start Date End Date Taking? Authorizing Provider  acetaminophen (TYLENOL) 500 MG tablet Take 1 tablet (500 mg total) by mouth every 6 (six) hours as needed. 11/16/15   Fayrene Helper, PA-C  cephALEXin (KEFLEX) 500 MG capsule Take 1 capsule (500 mg total) by mouth 4 (four) times daily. Patient not taking: Reported on 06/28/2014 05/25/14   Antony Madura, PA-C  cyclobenzaprine (FLEXERIL) 10 MG tablet Take 1 tablet (10 mg total) by mouth 2 (two) times daily as  needed for muscle spasms. Patient not taking: Reported on 06/28/2014 01/27/14   Emilia Beck, PA-C  HYDROcodone-acetaminophen (NORCO/VICODIN) 5-325 MG per tablet Take 1-2 tablets by mouth every 6 (six) hours as needed for moderate pain or severe pain. 07/04/14   Ladona Mow, PA-C    Family History No family history on file.  Social History Social History  Substance Use Topics  . Smoking status: Former Smoker    Years: 1.00    Types: Cigarettes  . Smokeless tobacco: Never Used  . Alcohol use No     Allergies   Bee venom; Dilaudid [hydromorphone hcl]; Aspirin; Ibuprofen; Morphine and related; Toradol [ketorolac tromethamine]; and Tramadol   Review of Systems Review of Systems  All other systems reviewed and are negative.    Physical Exam Updated Vital Signs BP 125/77 (BP Location: Left Arm)   Pulse 67   Temp 97.4 F (36.3 C) (Oral)   Resp 16   Ht 5\' 9"  (1.753 m)   Wt 70.8 kg (156 lb)   SpO2 99%   BMI 23.04 kg/m   Physical Exam  Constitutional: He is oriented to person, place, and time. He appears well-developed and well-nourished.  HENT:  Head: Normocephalic and atraumatic.  Right Ear: External ear normal.  Left Ear: External ear normal.  Eyes:  Conjunctivae and EOM are normal. Pupils are equal, round, and reactive to light.  Neck: Normal range of motion. Neck supple.  No pain with neck flexion, no meningismus  Cardiovascular: Normal rate, regular rhythm and normal heart sounds.  Exam reveals no gallop and no friction rub.   No murmur heard. Pulmonary/Chest: Effort normal and breath sounds normal. No respiratory distress. He has no wheezes. He has no rales. He exhibits no tenderness.  Abdominal: Soft. He exhibits no distension and no mass. There is no tenderness. There is no rebound and no guarding.  Musculoskeletal: Normal range of motion. He exhibits no edema or tenderness.  Range of motion and strength of left foot is 5/5, no bony abnormality or deformity,  there is some tenderness to palpation along the posterior aspect of the left foot, and patient does have some pain with dorsiflexion  Antalgic gait.  Neurological: He is alert and oriented to person, place, and time. He has normal reflexes.  CN 3-12 intact, speech is clear, movements are goal oriented, sensation and strength intact bilaterally.  Skin: Skin is warm and dry.  Psychiatric: He has a normal mood and affect. His behavior is normal. Judgment and thought content normal.  Nursing note and vitals reviewed.    ED Treatments / Results  Labs (all labs ordered are listed, but only abnormal results are displayed) Labs Reviewed - No data to display  EKG  EKG Interpretation None       Radiology Ct Head Wo Contrast  Result Date: 11/23/2016 CLINICAL DATA:  Headache EXAM: CT HEAD WITHOUT CONTRAST TECHNIQUE: Contiguous axial images were obtained from the base of the skull through the vertex without intravenous contrast. COMPARISON:  05/25/2014 FINDINGS: Brain: No evidence of acute infarction, hemorrhage, hydrocephalus, extra-axial collection or mass lesion/mass effect. Vascular: No hyperdense vessel or unexpected calcification. Skull: Normal. Negative for fracture or focal lesion. Sinuses/Orbits: Mucosal thickening in the ethmoid and maxillary sinuses. Old appearing left orbital medial wall fracture with herniation of intraorbital fat into the adjacent sinus. Other: None IMPRESSION: No CT evidence for acute intracranial abnormality. Electronically Signed   By: Jasmine PangKim  Fujinaga M.D.   On: 11/23/2016 01:31   Dg Foot Complete Left  Result Date: 11/23/2016 CLINICAL DATA:  Chronic left foot pain with swelling for 3 weeks EXAM: LEFT FOOT - COMPLETE 3+ VIEW COMPARISON:  04/21/2011 FINDINGS: There is no evidence of fracture or dislocation. There is no evidence of arthropathy or other focal bone abnormality. Soft tissues are unremarkable. IMPRESSION: Negative. Electronically Signed   By: Jasmine PangKim  Fujinaga  M.D.   On: 11/23/2016 01:10    Procedures Procedures (including critical care time)  Medications Ordered in ED Medications  metoCLOPramide (REGLAN) injection 10 mg (10 mg Intramuscular Given 11/23/16 0139)     Initial Impression / Assessment and Plan / ED Course  I have reviewed the triage vital signs and the nursing notes.  Pertinent labs & imaging results that were available during my care of the patient were reviewed by me and considered in my medical decision making (see chart for details).     Patient X-Ray negative for obvious fracture or dislocation.  Pt advised to follow up with orthopedics. Conservative therapy recommended and discussed. Patient will be discharged home & is agreeable with above plan. Returns precautions discussed. Pt appears safe for discharge.  Pt HA treated in ED.  CT head ordered given 1 week of persistent symptoms, no masses or abnormality seen.  Presentation is non concerning for [facility], ICH, Meningitis, or  temporal arteritis. Pt is afebrile with no focal neuro deficits, nuchal rigidity, or change in vision. Pt is to follow up with PCP to discuss prophylactic medication. Pt verbalizes understanding and is agreeable with plan to dc.    Final Clinical Impressions(s) / ED Diagnoses   Final diagnoses:  Extensor tendonitis of foot  Nonintractable headache, unspecified chronicity pattern, unspecified headache type    New Prescriptions New Prescriptions   No medications on file     Roxy Horseman, PA-C 11/23/16 0147    Gilda Crease, MD 11/26/16 [phone]

## 2016-11-23 NOTE — ED Notes (Signed)
Pt stable, understands discharge instructions, and reasons for return.   

## 2017-01-14 ENCOUNTER — Emergency Department (HOSPITAL_COMMUNITY): Payer: Self-pay

## 2017-01-14 ENCOUNTER — Emergency Department (HOSPITAL_COMMUNITY)
Admission: EM | Admit: 2017-01-14 | Discharge: 2017-01-14 | Disposition: A | Discharge status: Home / Self Care | Payer: Self-pay | Attending: Emergency Medicine | Admitting: Emergency Medicine

## 2017-01-14 ENCOUNTER — Encounter (HOSPITAL_COMMUNITY): Payer: Self-pay | Admitting: Emergency Medicine

## 2017-01-14 DIAGNOSIS — M25562 Pain in left knee: Secondary | ICD-10-CM | POA: Insufficient documentation

## 2017-01-14 DIAGNOSIS — Z87891 Personal history of nicotine dependence: Secondary | ICD-10-CM | POA: Insufficient documentation

## 2017-01-14 DIAGNOSIS — J45909 Unspecified asthma, uncomplicated: Secondary | ICD-10-CM | POA: Insufficient documentation

## 2017-01-14 DIAGNOSIS — M545 Low back pain, unspecified: Secondary | ICD-10-CM

## 2017-01-14 DIAGNOSIS — Z79899 Other long term (current) drug therapy: Secondary | ICD-10-CM | POA: Insufficient documentation

## 2017-01-14 MED ORDER — METHOCARBAMOL 500 MG PO TABS: 500.0000 mg | ORAL_TABLET | Freq: Two times a day (BID) | ORAL | 0 refills | Status: DC

## 2017-01-14 MED ORDER — HYDROCODONE-ACETAMINOPHEN 5-325 MG PO TABS
1.0000 | ORAL_TABLET | Freq: Once | ORAL | Status: AC
  Administered 2017-01-14: 1 via ORAL
  Filled 2017-01-14: qty 1

## 2017-01-14 NOTE — ED Notes (Signed)
Pt called out asking for pain meds. Informed pt that nurse was in another room with a pt and that I would ask her when she came out. [place] RN notifed

## 2017-01-14 NOTE — ED Notes (Signed)
Patient called out stating that he is in terrible pain.  Patient then began to yell out very loudly.  This RN spoke with patient that he cannot continue to yell.  Patient states that they will not do a CT scan or give him any pain medication and his back is hurting so much.  This RN explained that RNs cannot order test or medications that the ED providers will have to do that.  PA made aware of situation and stated she will see the patient as soon as they can.  Patient and family made aware of this.  Juice given to family.

## 2017-01-14 NOTE — ED Notes (Signed)
Pt called out stating he was in severe pain and that his back was "going numb."  Informed pt that the provider was aware and would see him as soon as possible.

## 2017-01-14 NOTE — ED Notes (Signed)
Patient in [facility] at this time.  [facility] to bring patient to room when done

## 2017-01-14 NOTE — Discharge Instructions (Signed)
Please read attached information regarding your condition. Take Robaxin as needed for muscle relaxer. Return to ED for worsening pain, numbness, weakness, trouble walking, additional injury, loss of bladder function, head injury.

## 2017-01-14 NOTE — ED Provider Notes (Signed)
[facility]PT Provider Note   CSN: [id number] Arrival date & time: 01/14/17  1928     History   Chief Complaint No chief complaint on file.   HPI Fred Wilson is a 31 y.o. male.  HPI  Patient presents to ED for evaluation of low back pain and left knee pain that he states occurred after mechanical fall prior to arrival. He states that he slipped on a wet floor at a restaurant and is continuing to have pain since incident. He has been ambulatory since the accident. Any prior neck surgeries, history of cancer, history of IV drug use. He denies any numbness, weakness, urinary incontinence, head injury or loss of consciousness.  Past Medical History:  Diagnosis Date  . ADHD (attention deficit hyperactivity disorder)   . Arthritis   . Asthma   . Bipolar 1 disorder (HCC)   . Bronchitis   . GERD (gastroesophageal reflux disease)   . Schizophrenia (HCC)   . Seasonal allergies   . Substance abuse   . Suicide attempt [facility])     Patient Active Problem List   Diagnosis Date Noted  . Schizophrenia (HCC)   . Suicide attempt (HCC)   . Substance abuse     Past Surgical History:  Procedure Laterality Date  . FRACTURE SURGERY    . KNEE SURGERY         Home Medications    Prior to Admission medications   Medication Sig Start Date End Date Taking? Authorizing Provider  acetaminophen (TYLENOL) 500 MG tablet Take 1 tablet (500 mg total) by mouth every 6 (six) hours as needed. 11/16/15   Fayrene Helper, PA-C  cephALEXin (KEFLEX) 500 MG capsule Take 1 capsule (500 mg total) by mouth 4 (four) times daily. Patient not taking: Reported on 06/28/2014 05/25/14   Antony Madura, PA-C  cyclobenzaprine (FLEXERIL) 10 MG tablet Take 1 tablet (10 mg total) by mouth 2 (two) times daily as needed for muscle spasms. Patient not taking: Reported on 06/28/2014 01/27/14   Emilia Beck, PA-C  HYDROcodone-acetaminophen (NORCO/VICODIN) 5-325 MG per tablet Take 1-2 tablets by mouth every 6 (six) hours  as needed for moderate pain or severe pain. 07/04/14   Ladona Mow, PA-C  methocarbamol (ROBAXIN) 500 MG tablet Take 1 tablet (500 mg total) by mouth 2 (two) times daily. 01/14/17   Dietrich Pates, PA-C    Family History History reviewed. No pertinent family history.  Social History Social History  Substance Use Topics  . Smoking status: Former Smoker    Years: 1.00    Types: Cigarettes  . Smokeless tobacco: Never Used  . Alcohol use No     Allergies   Bee venom; Dilaudid [hydromorphone hcl]; Aspirin; Ibuprofen; Morphine and related; Toradol [ketorolac tromethamine]; and Tramadol   Review of Systems Review of Systems  Constitutional: Negative for chills and fever.  Respiratory: Negative for shortness of breath.   Cardiovascular: Negative for chest pain.  Gastrointestinal: Negative for nausea and vomiting.  Musculoskeletal: Positive for arthralgias and back pain. Negative for neck pain and neck stiffness.  Neurological: Negative for weakness, numbness and headaches.     Physical Exam Updated Vital Signs BP (!) 148/75 (BP Location: Left Arm)   Pulse 68   Temp 97.9 F (36.6 C) (Oral)   Resp 16   Ht 5\' 9"  (1.753 m)   SpO2 99%   Physical Exam  Constitutional: He appears well-developed and well-nourished. No distress.  HENT:  Head: Normocephalic and atraumatic.  Eyes: Conjunctivae and EOM are  normal. No scleral icterus.  Neck: Normal range of motion.  Pulmonary/Chest: Effort normal. No respiratory distress.  Musculoskeletal: He exhibits tenderness.  Midline spinal tenderness noted in lumbar spine and paraspinal musculature. Left-sided hip tenderness as well. No midline spinal tenderness present in thoracic or cervical spine. No step-off palpated. No visible bruising, edema or temperature change noted. No objective signs of numbness present. No saddle anesthesia. 2+ DP pulses bilaterally. Sensation intact to light touch. Strength 5/5 in bilateral lower  extremities.  Tenderness to palpation behind left knee. No tenderness to palpation around patella. No visible deformity noted. Full active and passive range of motion of the knees.   Neurological: He is alert.  Skin: No rash noted. He is not diaphoretic.  Psychiatric: He has a normal mood and affect.  Nursing note and vitals reviewed.    ED Treatments / Results  Labs (all labs ordered are listed, but only abnormal results are displayed) Labs Reviewed - No data to display  EKG  EKG Interpretation None       Radiology Dg Lumbar Spine Complete  Result Date: 01/14/2017 CLINICAL DATA:  Lumbosacral back pain post fall. Patient reports "slipped and fell on a greasy floor at [employer] tonight." EXAM: LUMBAR SPINE - COMPLETE 4+ VIEW COMPARISON:  None. FINDINGS: The alignment is maintained. Vertebral body heights are normal. There is no listhesis. The posterior elements are intact. Disc spaces are preserved. No fracture. Sacroiliac joints are symmetric and normal. IMPRESSION: Negative radiographs of the lumbar spine. Electronically Signed   By: Rubye Oaks M.D.   On: 01/14/2017 22:31   Dg Knee Complete 4 Views Left  Result Date: 01/14/2017 CLINICAL DATA:  Larey Seat onto the left knee with pain EXAM: LEFT KNEE - COMPLETE 4+ VIEW COMPARISON:  10/05/2010 FINDINGS: No evidence of fracture, dislocation, or joint effusion. No evidence of arthropathy or other focal bone abnormality. Soft tissues are unremarkable. IMPRESSION: Negative. Electronically Signed   By: Jasmine Pang M.D.   On: 01/14/2017 20:33   Dg Hip Unilat W Or Wo Pelvis 2-3 Views Left  Result Date: 01/14/2017 CLINICAL DATA:  Left hip pain after fall. Patient reports "slipped and fell on a greasy floor at [employer] tonight." EXAM: DG HIP (WITH OR WITHOUT PELVIS) 2-3V LEFT COMPARISON:  Abdominal CT 10/01/2012 FINDINGS: The cortical margins of the bony pelvis and left hip are intact. There is a well corticated osseous density about  the lateral left acetabulum, unchanged from prior CT. No acute fracture. Pubic symphysis and sacroiliac joints are congruent. Both femoral heads are well-seated in the respective acetabula. IMPRESSION: No acute fracture or dislocation of the pelvis or left hip. Electronically Signed   By: Rubye Oaks M.D.   On: 01/14/2017 22:34    Procedures Procedures (including critical care time)  Medications Ordered in ED Medications  HYDROcodone-acetaminophen (NORCO/VICODIN) 5-325 MG per tablet 1 tablet (1 tablet Oral Given 01/14/17 2207)     Initial Impression / Assessment and Plan / ED Course  I have reviewed the triage vital signs and the nursing notes.  Pertinent labs & imaging results that were available during my care of the patient were reviewed by me and considered in my medical decision making (see chart for details).     Patient presents to ED for evaluation of left-sided low back pain and left knee pain that he states after a mechanical fall that occurred prior to arrival. He states severe pain in his lower back and patient repeatedly called out for wanting pain  medications while in the room. On my physical exam he did have tenderness to palpation of the left paraspinal musculature in the lumbar area. He denies any urinary incontinence, numbness, weakness. He is able to move the left knee with full active and passive range of motion. Sensation intact to light touch. Strength 5/5 in bilateral lower extremities. There is no visible bruising, step-off or midline tenderness noted. No focal findings on neurological exam. I have low suspicion for cauda equina or other acute spinal cord abnormality being the cause of his back pain. Patient appears much more comfortable after given pain medications here in the ED. He still states that he has "some pain" however when I left the room he appeared comfortable and was conversing with family members. X-rays of the hip, lumbar spine and knee were negative for  fracture dislocation. We'll give patient Robaxin to be taken as needed and advised him to wear knee sleeve as directed. Patient also requests crutches at this time. We will advised to apply heat and stretch area as tolerated. Patient appears stable for discharge at this time. Strict return precautions given.  Final Clinical Impressions(s) / ED Diagnoses   Final diagnoses:  Acute left-sided low back pain without sciatica  Acute pain of left knee    New Prescriptions New Prescriptions   METHOCARBAMOL (ROBAXIN) 500 MG TABLET    Take 1 tablet (500 mg total) by mouth 2 (two) times daily.     Dietrich PatesKhatri, Klover Priestly, PA-C 01/14/17 2244    Loren RacerYelverton, David, MD 01/17/17 2122

## 2017-01-14 NOTE — ED Notes (Signed)
Patient arrived from [facility], no signs of distress noted at this time.

## 2017-01-14 NOTE — ED Notes (Signed)
Patient transported to X-ray 

## 2017-01-14 NOTE — ED Notes (Signed)
Patient Alert and oriented X4. Stable and ambulatory. Patient verbalized understanding of the discharge instructions.  Patient belongings were taken by the patient. Patient asked for 2 bus passes.  Patient made aware that there were no more bus passes in the department.  If some were found they would be given to patient.  Patient and family ambulatory to the waiting room.

## 2017-01-14 NOTE — ED Triage Notes (Signed)
Patient received in triage and transferred to waiting area. Per EMS: [facility] floor, injured left knee and back. EMS states they saw no evidence of injury. [facility] [employer] [facility] that patient came into their establishment, informed them that he was homeless, they gave a free meal to him, he then laid down in front of ice machine on wet floor, began complaining that there was no we floor sign, and that his back and knee is injured.

## 2017-01-15 NOTE — ED Notes (Signed)
Family requested sprite and ginger ale at the time of discharge.  No other requests at this time.

## 2017-01-28 ENCOUNTER — Observation Stay (HOSPITAL_COMMUNITY)
Admission: EM | Admit: 2017-01-28 | Discharge: 2017-01-29 | Disposition: A | Discharge status: Home / Self Care | Payer: Self-pay | Attending: Emergency Medicine | Admitting: Emergency Medicine

## 2017-01-28 ENCOUNTER — Emergency Department (HOSPITAL_COMMUNITY): Payer: Self-pay

## 2017-01-28 ENCOUNTER — Encounter (HOSPITAL_COMMUNITY): Payer: Self-pay | Admitting: *Deleted

## 2017-01-28 DIAGNOSIS — Z23 Encounter for immunization: Secondary | ICD-10-CM | POA: Insufficient documentation

## 2017-01-28 DIAGNOSIS — R0602 Shortness of breath: Secondary | ICD-10-CM | POA: Insufficient documentation

## 2017-01-28 DIAGNOSIS — T148XXA Other injury of unspecified body region, initial encounter: Secondary | ICD-10-CM

## 2017-01-28 DIAGNOSIS — T797XXA Traumatic subcutaneous emphysema, initial encounter: Principal | ICD-10-CM | POA: Insufficient documentation

## 2017-01-28 DIAGNOSIS — Z885 Allergy status to narcotic agent status: Secondary | ICD-10-CM | POA: Insufficient documentation

## 2017-01-28 DIAGNOSIS — Y92009 Unspecified place in unspecified non-institutional (private) residence as the place of occurrence of the external cause: Secondary | ICD-10-CM | POA: Insufficient documentation

## 2017-01-28 DIAGNOSIS — S2191XA Laceration without foreign body of unspecified part of thorax, initial encounter: Secondary | ICD-10-CM | POA: Diagnosis present

## 2017-01-28 DIAGNOSIS — S21112A Laceration without foreign body of left front wall of thorax without penetration into thoracic cavity, initial encounter: Secondary | ICD-10-CM | POA: Insufficient documentation

## 2017-01-28 LAB — BPAM FFP
BLOOD PRODUCT EXPIRATION DATE: 201808082359
Blood Product Expiration Date: 201808082359
ISSUE DATE / TIME: 201808061342
ISSUE DATE / TIME: 201808061342
UNIT TYPE AND RH: 6200
UNIT TYPE AND RH: 6200

## 2017-01-28 LAB — URINALYSIS, ROUTINE W REFLEX MICROSCOPIC
Bilirubin Urine: NEGATIVE
GLUCOSE, UA: NEGATIVE mg/dL
HGB URINE DIPSTICK: NEGATIVE
KETONES UR: NEGATIVE mg/dL
Leukocytes, UA: NEGATIVE
NITRITE: NEGATIVE
PROTEIN: 30 mg/dL — AB
Specific Gravity, Urine: 1.036 — ABNORMAL HIGH (ref 1.005–1.030)
pH: 6 (ref 5.0–8.0)

## 2017-01-28 LAB — CBC
HEMATOCRIT: 38.9 % — AB (ref 39.0–52.0)
HEMOGLOBIN: 13 g/dL (ref 13.0–17.0)
MCH: 30 pg (ref 26.0–34.0)
MCHC: 33.4 g/dL (ref 30.0–36.0)
MCV: 89.6 fL (ref 78.0–100.0)
Platelets: 268 10*3/uL (ref 150–400)
RBC: 4.34 MIL/uL (ref 4.22–5.81)
RDW: 13.7 % (ref 11.5–15.5)
WBC: 12.5 10*3/uL — AB (ref 4.0–10.5)

## 2017-01-28 LAB — PROTIME-INR
INR: 1
Prothrombin Time: 13.2 seconds (ref 11.4–15.2)

## 2017-01-28 LAB — PREPARE FRESH FROZEN PLASMA
UNIT DIVISION: 0
Unit division: 0

## 2017-01-28 LAB — COMPREHENSIVE METABOLIC PANEL
ALBUMIN: 4.2 g/dL (ref 3.5–5.0)
ALT: 19 U/L (ref 17–63)
ANION GAP: 4 — AB (ref 5–15)
AST: 29 U/L (ref 15–41)
Alkaline Phosphatase: 69 U/L (ref 38–126)
BILIRUBIN TOTAL: 0.7 mg/dL (ref 0.3–1.2)
BUN: 9 mg/dL (ref 6–20)
CHLORIDE: 109 mmol/L (ref 101–111)
CO2: 26 mmol/L (ref 22–32)
Calcium: 9.4 mg/dL (ref 8.9–10.3)
Creatinine, Ser: 0.99 mg/dL (ref 0.61–1.24)
GFR calc Af Amer: >60 mL/min (ref >60)
GFR calc non Af Amer: >60 mL/min (ref >60)
GLUCOSE: 111 mg/dL — AB (ref 65–99)
POTASSIUM: 4.2 mmol/L (ref 3.5–5.1)
Sodium: 139 mmol/L (ref 135–145)
TOTAL PROTEIN: 7.3 g/dL (ref 6.5–8.1)

## 2017-01-28 LAB — ABO/RH: ABO/RH(D): A NEG

## 2017-01-28 LAB — I-STAT CHEM 8, ED
BUN: 9 mg/dL (ref 6–20)
CALCIUM ION: 1.13 mmol/L — AB (ref 1.15–1.40)
Chloride: 105 mmol/L (ref 101–111)
Creatinine, Ser: 0.9 mg/dL (ref 0.61–1.24)
Glucose, Bld: 109 mg/dL — ABNORMAL HIGH (ref 65–99)
HEMATOCRIT: 41 % (ref 39.0–52.0)
HEMOGLOBIN: 13.9 g/dL (ref 13.0–17.0)
Potassium: 4.2 mmol/L (ref 3.5–5.1)
SODIUM: 142 mmol/L (ref 135–145)
TCO2: 25 mmol/L (ref 0–100)

## 2017-01-28 LAB — ETHANOL: Alcohol, Ethyl (B): <5 mg/dL (ref <5)

## 2017-01-28 LAB — CDS SEROLOGY

## 2017-01-28 LAB — I-STAT CG4 LACTIC ACID, ED: LACTIC ACID, VENOUS: 0.91 mmol/L (ref 0.5–1.9)

## 2017-01-28 MED ORDER — SODIUM CHLORIDE 0.9% FLUSH: 3.0000 mL | Freq: Two times a day (BID) | INTRAVENOUS | Status: DC

## 2017-01-28 MED ORDER — PANTOPRAZOLE SODIUM 40 MG IV SOLR: 40.0000 mg | Freq: Every day | INTRAVENOUS | Status: DC

## 2017-01-28 MED ORDER — SODIUM CHLORIDE 0.9 % IV SOLN: 250.0000 mL | INTRAVENOUS | Status: DC | PRN

## 2017-01-28 MED ORDER — IOPAMIDOL (ISOVUE-300) INJECTION 61%
INTRAVENOUS | Status: AC
  Administered 2017-01-28: 100 mL
  Filled 2017-01-28: qty 100

## 2017-01-28 MED ORDER — SODIUM CHLORIDE 0.9% FLUSH: 3.0000 mL | INTRAVENOUS | Status: DC | PRN

## 2017-01-28 MED ORDER — ACETAMINOPHEN 500 MG PO TABS
1000.0000 mg | ORAL_TABLET | Freq: Three times a day (TID) | ORAL | Status: DC | PRN
  Administered 2017-01-28 – 2017-01-29 (×2): 1000 mg via ORAL
  Filled 2017-01-28 (×2): qty 2

## 2017-01-28 MED ORDER — PANTOPRAZOLE SODIUM 40 MG PO TBEC
40.0000 mg | DELAYED_RELEASE_TABLET | Freq: Every day | ORAL | Status: DC
  Administered 2017-01-28: 40 mg via ORAL
  Filled 2017-01-28: qty 1

## 2017-01-28 MED ORDER — ONDANSETRON HCL 4 MG/2ML IJ SOLN: 4.0000 mg | Freq: Four times a day (QID) | INTRAMUSCULAR | Status: DC | PRN

## 2017-01-28 MED ORDER — METHOCARBAMOL 750 MG PO TABS
750.0000 mg | ORAL_TABLET | Freq: Three times a day (TID) | ORAL | Status: DC | PRN
  Administered 2017-01-28: 750 mg via ORAL
  Filled 2017-01-28: qty 2

## 2017-01-28 MED ORDER — DOCUSATE SODIUM 100 MG PO CAPS
100.0000 mg | ORAL_CAPSULE | Freq: Two times a day (BID) | ORAL | Status: DC
  Administered 2017-01-28: 100 mg via ORAL
  Filled 2017-01-28: qty 1

## 2017-01-28 MED ORDER — ONDANSETRON 4 MG PO TBDP: 4.0000 mg | ORAL_TABLET | Freq: Four times a day (QID) | ORAL | Status: DC | PRN

## 2017-01-28 MED ORDER — FENTANYL CITRATE (PF) 100 MCG/2ML IJ SOLN
INTRAMUSCULAR | Status: AC
  Filled 2017-01-28: qty 2

## 2017-01-28 MED ORDER — IBUPROFEN 600 MG PO TABS: 600.0000 mg | ORAL_TABLET | Freq: Three times a day (TID) | ORAL | Status: DC | PRN

## 2017-01-28 MED ORDER — METOPROLOL TARTRATE 5 MG/5ML IV SOLN: 5.0000 mg | Freq: Four times a day (QID) | INTRAVENOUS | Status: DC | PRN

## 2017-01-28 MED ORDER — ENOXAPARIN SODIUM 40 MG/0.4ML ~~LOC~~ SOLN: 40.0000 mg | SUBCUTANEOUS | Status: DC

## 2017-01-28 MED ORDER — ACETAMINOPHEN 10 MG/ML IV SOLN
1000.0000 mg | Freq: Once | INTRAVENOUS | Status: AC
  Administered 2017-01-28: 1000 mg via INTRAVENOUS
  Filled 2017-01-28: qty 100

## 2017-01-28 NOTE — ED Notes (Signed)
Attempted report x 2 

## 2017-01-28 NOTE — ED Notes (Addendum)
fentanyl pulled out per verbal order by MD Lindie SpruceWyatt however pt states prior to administration pt states opioids  Cause hime to swell and not be able to breathe. Wasted whole vial 100mcg with Passenger transport managerCindy RN

## 2017-01-28 NOTE — Progress Notes (Signed)
Patient ID: Fred Wilson, male   DOB: 09/04/1985, 31 y.o.   MRN: [id number] Tolerating clears, hungry. Denies abdominal pain. Abdomen soft, nontender. Continue clears tonight and follow.  Violeta GelinasBurke Fred Malbrough, MD, MPH, FACS Trauma: [phone] General Surgery: [phone]
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
## 2017-01-28 NOTE — ED Notes (Signed)
Attempted report x1. 

## 2017-01-28 NOTE — ED Notes (Signed)
Bed assignment had to be changed. Pt waiting on a bed.

## 2017-01-28 NOTE — ED Notes (Signed)
Report attempted unable to speak with RN states to call back at 1715.
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
## 2017-01-28 NOTE — ED Notes (Signed)
CSI at bedside.

## 2017-01-28 NOTE — ED Notes (Signed)
Pt ambulatory to bathroom with steady gait.

## 2017-01-28 NOTE — ED Notes (Signed)
Pt transported back from CT.  

## 2017-01-29 ENCOUNTER — Encounter (HOSPITAL_COMMUNITY): Payer: Self-pay | Admitting: *Deleted

## 2017-01-29 LAB — BPAM RBC
Blood Product Expiration Date: 201808092359
Blood Product Expiration Date: 201808092359
ISSUE DATE / TIME: 201808061342
ISSUE DATE / TIME: 201808061342
UNIT TYPE AND RH: 9500
Unit Type and Rh: 9500

## 2017-01-29 LAB — BASIC METABOLIC PANEL
ANION GAP: 8 (ref 5–15)
BUN: 6 mg/dL (ref 6–20)
CALCIUM: 8.9 mg/dL (ref 8.9–10.3)
CO2: 25 mmol/L (ref 22–32)
Chloride: 105 mmol/L (ref 101–111)
Creatinine, Ser: 0.82 mg/dL (ref 0.61–1.24)
GFR calc non Af Amer: >60 mL/min (ref >60)
GLUCOSE: 111 mg/dL — AB (ref 65–99)
POTASSIUM: 4 mmol/L (ref 3.5–5.1)
Sodium: 138 mmol/L (ref 135–145)

## 2017-01-29 LAB — TYPE AND SCREEN
ABO/RH(D): A NEG
ANTIBODY SCREEN: NEGATIVE
Unit division: 0
Unit division: 0

## 2017-01-29 LAB — CBC
HEMATOCRIT: 37.5 % — AB (ref 39.0–52.0)
Hemoglobin: 12.7 g/dL — ABNORMAL LOW (ref 13.0–17.0)
MCH: 30 pg (ref 26.0–34.0)
MCHC: 33.9 g/dL (ref 30.0–36.0)
MCV: 88.7 fL (ref 78.0–100.0)
Platelets: 231 10*3/uL (ref 150–400)
RBC: 4.23 MIL/uL (ref 4.22–5.81)
RDW: 13.4 % (ref 11.5–15.5)
WBC: 7.2 10*3/uL (ref 4.0–10.5)

## 2017-01-29 MED ORDER — PNEUMOCOCCAL VAC POLYVALENT 25 MCG/0.5ML IJ INJ
0.5000 mL | INJECTION | INTRAMUSCULAR | Status: AC
  Administered 2017-01-29: 0.5 mL via INTRAMUSCULAR

## 2017-01-29 NOTE — Discharge Instructions (Signed)
You may shower without a bandage on your wound and let warm soap/water run over the wound to help cleanse it. You may keep gauze or a bandage over it as needed.   You may resume a regular diet and regular activity as needed. You may take colace or miralax as needed for constipation.  You may take tylenol as needed for pain, do not exceed 3,500 mg of tylenol in 24 hours.   Please call with any questions or concerns.
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
## 2017-01-29 NOTE — Progress Notes (Signed)
Fred Wilson to be D/C'd  per MD order. Discussed with the patient and all questions fully answered.  VSS, Skin clean, dry and intact without evidence of skin break down, no evidence of skin tears noted.  IV catheter discontinued intact. Site without signs and symptoms of complications. Dressing and pressure applied.  An After Visit Summary was printed and given to the patient. Patient received prescription.  D/c education completed with patient/family including follow up instructions, medication list, d/c activities limitations if indicated, with other d/c instructions as indicated by MD - patient able to verbalize understanding, all questions fully answered.   Patient instructed to return to ED, call 911, or call MD for any changes in condition.   Patient walking out, and D/C home.   Given patient 2 bus passes from Child psychotherapistsocial worker.

## 2017-02-01 ENCOUNTER — Encounter (HOSPITAL_COMMUNITY): Payer: Self-pay | Admitting: Emergency Medicine

## 2017-06-19 ENCOUNTER — Emergency Department (HOSPITAL_COMMUNITY): Payer: Self-pay

## 2017-06-19 ENCOUNTER — Emergency Department (HOSPITAL_COMMUNITY)
Admission: EM | Admit: 2017-06-19 | Discharge: 2017-06-19 | Disposition: A | Discharge status: Home / Self Care | Payer: Self-pay | Attending: Emergency Medicine | Admitting: Emergency Medicine

## 2017-06-19 DIAGNOSIS — F17210 Nicotine dependence, cigarettes, uncomplicated: Secondary | ICD-10-CM | POA: Insufficient documentation

## 2017-06-19 DIAGNOSIS — J45909 Unspecified asthma, uncomplicated: Secondary | ICD-10-CM | POA: Insufficient documentation

## 2017-06-19 DIAGNOSIS — M25552 Pain in left hip: Secondary | ICD-10-CM | POA: Insufficient documentation

## 2017-06-19 NOTE — Discharge Instructions (Signed)
Please read attached information. If you experience any new or worsening signs or symptoms please return to the emergency room for evaluation. Please follow-up with your primary care provider or specialist as discussed.  °
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
## 2017-06-19 NOTE — ED Notes (Signed)
Patient transported to X-ray 
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
## 2017-06-20 ENCOUNTER — Encounter (HOSPITAL_COMMUNITY): Payer: Self-pay | Admitting: *Deleted

## 2017-06-20 ENCOUNTER — Other Ambulatory Visit: Payer: Self-pay

## 2017-06-20 ENCOUNTER — Emergency Department (HOSPITAL_COMMUNITY)
Admission: EM | Admit: 2017-06-20 | Discharge: 2017-06-20 | Disposition: A | Discharge status: Home / Self Care | Payer: Self-pay | Attending: Emergency Medicine | Admitting: Emergency Medicine

## 2017-06-20 ENCOUNTER — Emergency Department (HOSPITAL_COMMUNITY): Payer: Self-pay

## 2017-06-20 DIAGNOSIS — Y999 Unspecified external cause status: Secondary | ICD-10-CM | POA: Insufficient documentation

## 2017-06-20 DIAGNOSIS — J45909 Unspecified asthma, uncomplicated: Secondary | ICD-10-CM | POA: Insufficient documentation

## 2017-06-20 DIAGNOSIS — F17210 Nicotine dependence, cigarettes, uncomplicated: Secondary | ICD-10-CM | POA: Insufficient documentation

## 2017-06-20 DIAGNOSIS — Y9289 Other specified places as the place of occurrence of the external cause: Secondary | ICD-10-CM | POA: Insufficient documentation

## 2017-06-20 DIAGNOSIS — Y939 Activity, unspecified: Secondary | ICD-10-CM | POA: Insufficient documentation

## 2017-06-20 DIAGNOSIS — S060X1A Concussion with loss of consciousness of 30 minutes or less, initial encounter: Secondary | ICD-10-CM | POA: Insufficient documentation

## 2017-06-20 DIAGNOSIS — S0990XA Unspecified injury of head, initial encounter: Secondary | ICD-10-CM | POA: Insufficient documentation

## 2017-06-20 MED ORDER — PROMETHAZINE HCL 25 MG PO TABS: 25.0000 mg | ORAL_TABLET | Freq: Four times a day (QID) | ORAL | 0 refills | Status: DC | PRN

## 2017-06-20 MED ORDER — ACETAMINOPHEN 325 MG PO TABS
650.0000 mg | ORAL_TABLET | Freq: Once | ORAL | Status: AC
  Administered 2017-06-20: 650 mg via ORAL
  Filled 2017-06-20: qty 2

## 2017-06-20 MED ORDER — ACETAMINOPHEN 500 MG PO TABS: 500.0000 mg | ORAL_TABLET | Freq: Four times a day (QID) | ORAL | 0 refills | Status: DC | PRN
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
## 2017-07-02 ENCOUNTER — Other Ambulatory Visit: Payer: Self-pay

## 2017-07-12 ENCOUNTER — Emergency Department (HOSPITAL_COMMUNITY): Payer: Self-pay

## 2017-07-12 DIAGNOSIS — F17210 Nicotine dependence, cigarettes, uncomplicated: Secondary | ICD-10-CM | POA: Insufficient documentation

## 2017-07-12 DIAGNOSIS — G8929 Other chronic pain: Secondary | ICD-10-CM | POA: Insufficient documentation

## 2017-07-12 DIAGNOSIS — J45909 Unspecified asthma, uncomplicated: Secondary | ICD-10-CM | POA: Insufficient documentation

## 2017-07-12 DIAGNOSIS — Z79899 Other long term (current) drug therapy: Secondary | ICD-10-CM | POA: Insufficient documentation

## 2017-07-12 DIAGNOSIS — M25562 Pain in left knee: Secondary | ICD-10-CM | POA: Insufficient documentation

## 2017-07-12 NOTE — ED Triage Notes (Signed)
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 Reports numbness to L leg on Sunday.

## 2017-07-13 ENCOUNTER — Emergency Department (HOSPITAL_COMMUNITY)
Admission: EM | Admit: 2017-07-13 | Discharge: 2017-07-13 | Disposition: A | Discharge status: Home / Self Care | Payer: Self-pay | Attending: Emergency Medicine | Admitting: Emergency Medicine

## 2017-07-13 DIAGNOSIS — M25562 Pain in left knee: Secondary | ICD-10-CM

## 2017-07-13 DIAGNOSIS — G8929 Other chronic pain: Secondary | ICD-10-CM

## 2017-07-13 MED ORDER — ACETAMINOPHEN 500 MG PO TABS
1000.0000 mg | ORAL_TABLET | Freq: Once | ORAL | Status: AC
  Administered 2017-07-13: 1000 mg via ORAL
  Filled 2017-07-13: qty 2

## 2017-07-13 NOTE — ED Notes (Signed)
Patient is alert and orientedx4.  Patient was explained discharge instructions and they understood them with no questions.   
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
## 2017-07-13 NOTE — Progress Notes (Signed)
Orthopedic Tech Progress Note Patient Details:  Katherine MantleByron L Escudero 06/24/1986 [id number]  Ortho Devices Type of Ortho Device: Crutches, Knee Sleeve Ortho Device/Splint Location: lle Ortho Device/Splint Interventions: Ordered, Application, Adjustment   Post Interventions Patient Tolerated: Well Instructions Provided: Care of device, Adjustment of device   Trinna PostMartinez, Demetrius Mahler J 07/13/2017, 4:20 AM

## 2017-07-16 ENCOUNTER — Ambulatory Visit: Payer: Self-pay | Admitting: Internal Medicine

## 2017-08-06 ENCOUNTER — Emergency Department (HOSPITAL_COMMUNITY)
Admission: EM | Admit: 2017-08-06 | Discharge: 2017-08-07 | Disposition: A | Discharge status: Home / Self Care | Payer: Self-pay | Attending: Emergency Medicine | Admitting: Emergency Medicine

## 2017-08-06 ENCOUNTER — Other Ambulatory Visit: Payer: Self-pay

## 2017-08-06 ENCOUNTER — Emergency Department (HOSPITAL_COMMUNITY): Payer: Self-pay

## 2017-08-06 ENCOUNTER — Encounter (HOSPITAL_COMMUNITY): Payer: Self-pay | Admitting: Emergency Medicine

## 2017-08-06 DIAGNOSIS — F909 Attention-deficit hyperactivity disorder, unspecified type: Secondary | ICD-10-CM | POA: Insufficient documentation

## 2017-08-06 DIAGNOSIS — F17210 Nicotine dependence, cigarettes, uncomplicated: Secondary | ICD-10-CM | POA: Insufficient documentation

## 2017-08-06 DIAGNOSIS — M25532 Pain in left wrist: Secondary | ICD-10-CM

## 2017-08-06 DIAGNOSIS — Z79899 Other long term (current) drug therapy: Secondary | ICD-10-CM | POA: Insufficient documentation

## 2017-08-06 DIAGNOSIS — G8929 Other chronic pain: Secondary | ICD-10-CM

## 2017-08-06 DIAGNOSIS — J302 Other seasonal allergic rhinitis: Secondary | ICD-10-CM | POA: Insufficient documentation

## 2017-08-06 DIAGNOSIS — M25562 Pain in left knee: Secondary | ICD-10-CM | POA: Insufficient documentation

## 2017-08-06 DIAGNOSIS — J45909 Unspecified asthma, uncomplicated: Secondary | ICD-10-CM | POA: Insufficient documentation

## 2017-08-06 MED ORDER — ACETAMINOPHEN 500 MG PO TABS
1000.0000 mg | ORAL_TABLET | Freq: Once | ORAL | Status: AC
  Administered 2017-08-07: 1000 mg via ORAL
  Filled 2017-08-06: qty 2

## 2017-08-06 NOTE — ED Triage Notes (Addendum)
Pt to ED c/o issues with L wrist and L knee. Patient reports previous surgeries on both L wrist and knee, but new issues have come up within the last month. He states his L knee keeps buckling and wanting to give out on him. Walked in triage with limp, but able to bear weight. Patient also reports decreased ROM with L thumb because of his wrist. Patient denies new injuries, reports they both came on gradually. Pulses equal all extremities.

## 2017-08-07 ENCOUNTER — Other Ambulatory Visit: Payer: Self-pay

## 2017-08-07 ENCOUNTER — Encounter (HOSPITAL_COMMUNITY): Payer: Self-pay | Admitting: *Deleted

## 2017-08-07 ENCOUNTER — Emergency Department (HOSPITAL_COMMUNITY)
Admission: EM | Admit: 2017-08-07 | Discharge: 2017-08-07 | Disposition: A | Discharge status: Home / Self Care | Payer: Medicaid Other | Attending: Emergency Medicine | Admitting: Emergency Medicine

## 2017-08-07 DIAGNOSIS — F909 Attention-deficit hyperactivity disorder, unspecified type: Secondary | ICD-10-CM | POA: Insufficient documentation

## 2017-08-07 DIAGNOSIS — F17210 Nicotine dependence, cigarettes, uncomplicated: Secondary | ICD-10-CM | POA: Insufficient documentation

## 2017-08-07 DIAGNOSIS — J45909 Unspecified asthma, uncomplicated: Secondary | ICD-10-CM | POA: Insufficient documentation

## 2017-08-07 DIAGNOSIS — R197 Diarrhea, unspecified: Secondary | ICD-10-CM | POA: Insufficient documentation

## 2017-08-07 DIAGNOSIS — Z79899 Other long term (current) drug therapy: Secondary | ICD-10-CM | POA: Insufficient documentation

## 2017-08-07 DIAGNOSIS — R112 Nausea with vomiting, unspecified: Secondary | ICD-10-CM | POA: Insufficient documentation

## 2017-08-07 DIAGNOSIS — F209 Schizophrenia, unspecified: Secondary | ICD-10-CM | POA: Insufficient documentation

## 2017-08-07 DIAGNOSIS — F319 Bipolar disorder, unspecified: Secondary | ICD-10-CM | POA: Insufficient documentation

## 2017-08-07 LAB — CBC
HCT: 42.2 % (ref 39.0–52.0)
Hemoglobin: 14.4 g/dL (ref 13.0–17.0)
MCH: 31.2 pg (ref 26.0–34.0)
MCHC: 34.1 g/dL (ref 30.0–36.0)
MCV: 91.3 fL (ref 78.0–100.0)
Platelets: 249 10*3/uL (ref 150–400)
RBC: 4.62 MIL/uL (ref 4.22–5.81)
RDW: 13.6 % (ref 11.5–15.5)
WBC: 12.8 10*3/uL — AB (ref 4.0–10.5)

## 2017-08-07 LAB — URINALYSIS, ROUTINE W REFLEX MICROSCOPIC
Bilirubin Urine: NEGATIVE
Glucose, UA: NEGATIVE mg/dL
Hgb urine dipstick: NEGATIVE
KETONES UR: 5 mg/dL — AB
LEUKOCYTES UA: NEGATIVE
NITRITE: NEGATIVE
Protein, ur: NEGATIVE mg/dL
Specific Gravity, Urine: 1.029 (ref 1.005–1.030)
pH: 5 (ref 5.0–8.0)

## 2017-08-07 LAB — COMPREHENSIVE METABOLIC PANEL
ALT: 15 U/L — ABNORMAL LOW (ref 17–63)
AST: 20 U/L (ref 15–41)
Albumin: 4.5 g/dL (ref 3.5–5.0)
Alkaline Phosphatase: 70 U/L (ref 38–126)
Anion gap: 11 (ref 5–15)
BUN: 12 mg/dL (ref 6–20)
CO2: 23 mmol/L (ref 22–32)
CREATININE: 0.92 mg/dL (ref 0.61–1.24)
Calcium: 9.6 mg/dL (ref 8.9–10.3)
Chloride: 102 mmol/L (ref 101–111)
Glucose, Bld: 92 mg/dL (ref 65–99)
POTASSIUM: 4.2 mmol/L (ref 3.5–5.1)
Sodium: 136 mmol/L (ref 135–145)
TOTAL PROTEIN: 7.9 g/dL (ref 6.5–8.1)
Total Bilirubin: 1.5 mg/dL — ABNORMAL HIGH (ref 0.3–1.2)

## 2017-08-07 LAB — LIPASE, BLOOD: LIPASE: 23 U/L (ref 11–51)

## 2017-08-07 MED ORDER — PROMETHAZINE HCL 25 MG PO TABS
25.0000 mg | ORAL_TABLET | Freq: Once | ORAL | Status: AC
Start: 2017-08-07 — End: 2017-08-07
  Administered 2017-08-07: 25 mg via ORAL
  Filled 2017-08-07: qty 1

## 2017-08-07 MED ORDER — ACETAMINOPHEN 325 MG PO TABS
650.0000 mg | ORAL_TABLET | Freq: Once | ORAL | Status: AC
  Administered 2017-08-07: 650 mg via ORAL
  Filled 2017-08-07: qty 2

## 2017-08-07 MED ORDER — ONDANSETRON HCL 4 MG PO TABS: 4.0000 mg | ORAL_TABLET | Freq: Four times a day (QID) | ORAL | 0 refills | Status: DC
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
## 2017-08-07 NOTE — Discharge Instructions (Signed)
Please read attached information. If you experience any new or worsening signs or symptoms please return to the emergency room for evaluation. Please follow-up with your primary care provider or specialist as discussed. Please use medication prescribed only as directed and discontinue taking if you have any concerning signs or symptoms.   °
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
## 2017-08-07 NOTE — ED Triage Notes (Signed)
To ED for eval of n/v since this am. Also complains of cold chills. Appears in nad. Masked at triage. States he was discharged this am from hospital this am after thumb/wrist injury

## 2017-08-07 NOTE — ED Notes (Signed)
Pt states he is hungry.

## 2017-08-07 NOTE — ED Notes (Signed)
Pt verbalized understanding discharge instructions and denies any further needs or questions at this time. VS stable, ambulatory and steady gait.   

## 2017-08-13 ENCOUNTER — Emergency Department (HOSPITAL_COMMUNITY)
Admission: EM | Admit: 2017-08-13 | Discharge: 2017-08-14 | Disposition: A | Discharge status: Home / Self Care | Payer: Medicaid Other | Attending: Emergency Medicine | Admitting: Emergency Medicine

## 2017-08-13 ENCOUNTER — Encounter (HOSPITAL_COMMUNITY): Payer: Self-pay | Admitting: Emergency Medicine

## 2017-08-13 ENCOUNTER — Other Ambulatory Visit: Payer: Self-pay

## 2017-08-13 DIAGNOSIS — G8929 Other chronic pain: Secondary | ICD-10-CM | POA: Insufficient documentation

## 2017-08-13 DIAGNOSIS — Z79899 Other long term (current) drug therapy: Secondary | ICD-10-CM | POA: Insufficient documentation

## 2017-08-13 DIAGNOSIS — J45909 Unspecified asthma, uncomplicated: Secondary | ICD-10-CM | POA: Insufficient documentation

## 2017-08-13 DIAGNOSIS — F17210 Nicotine dependence, cigarettes, uncomplicated: Secondary | ICD-10-CM | POA: Insufficient documentation

## 2017-08-13 DIAGNOSIS — M25532 Pain in left wrist: Secondary | ICD-10-CM | POA: Insufficient documentation

## 2017-08-13 HISTORY — DX: Malingerer (conscious simulation): Z76.5

## 2017-08-13 NOTE — ED Notes (Signed)
Ortho tech paged  

## 2017-08-13 NOTE — ED Triage Notes (Signed)
Pt is not truthful during triage. Reports L hand pain. Initially states that he had a splint on his L hand that "started to come off" earlier today. Pt then states the splint "came off Saturday" After looking through pt chart he was seen here 2/12 for eval of his chronic L hand/wrist pain. Xray negative. Pt was given a thumb spica........ Pt states he was only given Tylenol and wants something stronger, he thinks he "needs some oxycodone"

## 2017-08-14 MED ORDER — ACETAMINOPHEN 500 MG PO TABS
1000.0000 mg | ORAL_TABLET | Freq: Once | ORAL | Status: AC
  Administered 2017-08-14: 1000 mg via ORAL
  Filled 2017-08-14: qty 2

## 2017-08-14 MED ORDER — SIMETHICONE 40 MG/0.6ML PO SUSP (UNIT DOSE)
40.0000 mg | Freq: Once | ORAL | Status: AC
  Administered 2017-08-14: 40 mg via ORAL
  Filled 2017-08-14: qty 0.6

## 2017-08-14 MED ORDER — GI COCKTAIL ~~LOC~~
30.0000 mL | Freq: Once | ORAL | Status: AC
  Administered 2017-08-14: 30 mL via ORAL
  Filled 2017-08-14: qty 30

## 2017-08-14 NOTE — Discharge Instructions (Signed)
Take 1000 mg tylenol for pain. Follow up with hand surgeon for re-evaluation. You may need MRI or other imaging for further evaluation.

## 2017-08-14 NOTE — Progress Notes (Signed)
Orthopedic Tech Progress Note Patient Details:  Fred MantleByron L Wilson 09/12/1985 [id number]  Ortho Devices Type of Ortho Device: Velcro wrist splint Ortho Device/Splint Location: lue Ortho Device/Splint Interventions: Ordered, Adjustment   Post Interventions Patient Tolerated: Well Instructions Provided: Care of device, Adjustment of device   Trinna PostMartinez, Elisabetta Mishra J 08/14/2017, 12:27 AM
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
## 2017-08-30 ENCOUNTER — Encounter: Payer: Self-pay | Admitting: Internal Medicine

## 2017-09-06 ENCOUNTER — Other Ambulatory Visit (HOSPITAL_COMMUNITY)
Admission: RE | Admit: 2017-09-06 | Discharge: 2017-09-06 | Disposition: A | Discharge status: Home / Self Care | Payer: Medicaid Other | Source: Ambulatory Visit | Attending: Family | Admitting: Family

## 2017-09-06 ENCOUNTER — Other Ambulatory Visit: Payer: Medicaid Other

## 2017-09-06 DIAGNOSIS — Z113 Encounter for screening for infections with a predominantly sexual mode of transmission: Secondary | ICD-10-CM | POA: Diagnosis not present

## 2017-09-06 DIAGNOSIS — B20 Human immunodeficiency virus [HIV] disease: Secondary | ICD-10-CM

## 2017-09-06 LAB — T-HELPER CELL (CD4) - (RCID CLINIC ONLY)
CD4 % Helper T Cell: 30 % — ABNORMAL LOW (ref 33–55)
CD4 T CELL ABS: 580 /uL (ref 400–2700)

## 2017-09-06 LAB — URINALYSIS
Bilirubin Urine: NEGATIVE
Glucose, UA: NEGATIVE
HGB URINE DIPSTICK: NEGATIVE
Ketones, ur: NEGATIVE
LEUKOCYTES UA: NEGATIVE
Nitrite: NEGATIVE
PH: 6 (ref 5.0–8.0)
Protein, ur: NEGATIVE
Specific Gravity, Urine: 1.021 (ref 1.001–1.03)

## 2017-09-09 LAB — URINE CYTOLOGY ANCILLARY ONLY
CHLAMYDIA, DNA PROBE: NEGATIVE
Neisseria Gonorrhea: NEGATIVE

## 2017-09-12 LAB — CBC WITH DIFFERENTIAL/PLATELET
BASOS PCT: 0.3 %
Basophils Absolute: 22 cells/uL (ref 0–200)
EOS PCT: 4 %
Eosinophils Absolute: 288 cells/uL (ref 15–500)
HEMATOCRIT: 40.7 % (ref 38.5–50.0)
Hemoglobin: 13.9 g/dL (ref 13.2–17.1)
LYMPHS ABS: 1584 {cells}/uL (ref 850–3900)
MCH: 31 pg (ref 27.0–33.0)
MCHC: 34.2 g/dL (ref 32.0–36.0)
MCV: 90.6 fL (ref 80.0–100.0)
MPV: 9.8 fL (ref 7.5–12.5)
Monocytes Relative: 8.5 %
NEUTROS PCT: 65.2 %
Neutro Abs: 4694 cells/uL (ref 1500–7800)
PLATELETS: 254 10*3/uL (ref 140–400)
RBC: 4.49 10*6/uL (ref 4.20–5.80)
RDW: 12.6 % (ref 11.0–15.0)
Total Lymphocyte: 22 %
WBC: 7.2 10*3/uL (ref 3.8–10.8)
WBCMIX: 612 {cells}/uL (ref 200–950)

## 2017-09-12 LAB — HEPATITIS A ANTIBODY, TOTAL: Hepatitis A AB,Total: NONREACTIVE

## 2017-09-12 LAB — QUANTIFERON-TB GOLD PLUS
NIL: 0.06 [IU]/mL
QUANTIFERON-TB GOLD PLUS: NEGATIVE
TB1-NIL: 0.02 [IU]/mL
TB2-NIL: <0 IU/mL

## 2017-09-12 LAB — LIPID PANEL
Cholesterol: 146 mg/dL (ref <200)
HDL: 40 mg/dL — AB (ref >40)
LDL Cholesterol (Calc): 69 mg/dL (calc)
NON-HDL CHOLESTEROL (CALC): 106 mg/dL (ref <130)
Total CHOL/HDL Ratio: 3.7 (calc) (ref <5.0)
Triglycerides: 281 mg/dL — ABNORMAL HIGH (ref <150)

## 2017-09-12 LAB — HEPATITIS B CORE ANTIBODY, TOTAL: Hep B Core Total Ab: NONREACTIVE

## 2017-09-12 LAB — RPR: RPR Ser Ql: NONREACTIVE

## 2017-09-12 LAB — HEPATITIS B SURFACE ANTIBODY,QUALITATIVE: HEP B S AB: NONREACTIVE

## 2017-09-12 LAB — COMPLETE METABOLIC PANEL WITH GFR
AG Ratio: 1.5 (calc) (ref 1.0–2.5)
ALKALINE PHOSPHATASE (APISO): 69 U/L (ref 40–115)
ALT: 15 U/L (ref 9–46)
AST: 28 U/L (ref 10–40)
Albumin: 4.5 g/dL (ref 3.6–5.1)
BUN: 8 mg/dL (ref 7–25)
CALCIUM: 9.8 mg/dL (ref 8.6–10.3)
CO2: 30 mmol/L (ref 20–32)
Chloride: 102 mmol/L (ref 98–110)
Creat: 0.85 mg/dL (ref 0.60–1.35)
GFR, EST NON AFRICAN AMERICAN: 116 mL/min/{1.73_m2} (ref >=60)
GFR, Est African American: 135 mL/min/{1.73_m2} (ref >=60)
GLOBULIN: 3 g/dL (ref 1.9–3.7)
Glucose, Bld: 87 mg/dL (ref 65–99)
POTASSIUM: 4.3 mmol/L (ref 3.5–5.3)
Sodium: 139 mmol/L (ref 135–146)
Total Bilirubin: 0.4 mg/dL (ref 0.2–1.2)
Total Protein: 7.5 g/dL (ref 6.1–8.1)

## 2017-09-12 LAB — HEPATITIS C ANTIBODY
HEP C AB: NONREACTIVE
SIGNAL TO CUT-OFF: 0.13 (ref <1.00)

## 2017-09-12 LAB — HEPATITIS B SURFACE ANTIGEN: HEP B S AG: NONREACTIVE

## 2017-09-12 LAB — HLA B*5701: HLA-B*5701 w/rflx HLA-B High: NEGATIVE

## 2017-09-16 LAB — RFLX HIV-1 INTEGRASE GENOTYPE: HIV-1 Genotype: DETECTED — AB

## 2017-09-16 LAB — HIV-1 RNA ULTRAQUANT REFLEX TO GENTYP+
HIV 1 RNA QUANT: 9170 {copies}/mL — AB
HIV-1 RNA QUANT, LOG: 3.96 {Log_copies}/mL — AB

## 2017-09-20 ENCOUNTER — Encounter: Payer: Self-pay | Admitting: Family

## 2017-09-20 ENCOUNTER — Ambulatory Visit: Admitting: Pharmacist Clinician (PhC)/ Clinical Pharmacy Specialist

## 2017-09-20 ENCOUNTER — Ambulatory Visit (INDEPENDENT_AMBULATORY_CARE_PROVIDER_SITE_OTHER): Admitting: Family

## 2017-09-20 VITALS — BP 136/87 | HR 77 | Temp 98.2°F | Ht 69.0 in

## 2017-09-20 DIAGNOSIS — B20 Human immunodeficiency virus [HIV] disease: Secondary | ICD-10-CM | POA: Diagnosis not present

## 2017-09-20 DIAGNOSIS — Z23 Encounter for immunization: Secondary | ICD-10-CM

## 2017-09-20 MED ORDER — BICTEGRAVIR-EMTRICITAB-TENOFOV 50-200-25 MG PO TABS: 1.0000 | ORAL_TABLET | Freq: Every day | ORAL | 2 refills | Status: DC

## 2017-09-20 NOTE — Progress Notes (Signed)
Subjective:    Patient ID: Fred Wilson, male    DOB: 09/29/1985, 32 y.o.   MRN: [id number]  Chief Complaint  Patient presents with  . HIV Positive/AIDS    HPI:  Fred Wilson is a 32 y.o. male who presents today to establish care for his HIV disease.   Fred Wilson was diagnosed with HIV in April of 2016 while he was incarcerated in the [facility]. His risk factor for HIV includes unprotected sexual contact with a potentially positive HIV male partner. Denies MSM or IVDU. He was last seen at [facility] following initial diagnosis and started on Genvoya which he did not take. Blood work completed on 09/06/17 showed a viral load of 9,170 and a CD4 count of 580. 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 He was negative for Hepatitis A, Hepatitis B, Hepatitis C, HLA-B 5701, GC/Chlamydia, and Quantiferon Gold. There was no immunity to Hepatitis A or B. His lipid profile showed an LDL of 69 and a HDL of 40. He remains incarcerated at the [facility]. He does have ADAP in place.   Denies fevers, chills, night sweats, headaches, changes in vision, neck pain/stiffness, nausea, vomiting, diarrhea, rashes or lesions.  Allergies  Allergen Reactions  . Bee Venom Anaphylaxis  . Dilaudid [Hydromorphone Hcl] Anaphylaxis, Hives and Itching  . Fentanyl Hives, Shortness Of Breath and Swelling  . Morphine And Related Hives, Shortness Of Breath and Swelling  . Olanzapine Hives  . Tramadol Hives, Shortness Of Breath and Swelling  . Aspirin Hives and Swelling    Hives and swollen glands  . Ibuprofen Hives and Swelling    Hives and swollen glands  . Morphine And Related Hives and Swelling    Hives and swollen glands  . Toradol [Ketorolac Tromethamine] Hives and Swelling    Glands swell  . Tramadol Hives and Swelling    Hives and swollen glands      Outpatient Medications Prior to Visit  Medication Sig Dispense  Refill  . acetaminophen (TYLENOL) 500 MG tablet Take 1 tablet (500 mg total) by mouth every 6 (six) hours as needed for mild pain, moderate pain or headache. 30 tablet 0  . methocarbamol (ROBAXIN) 500 MG tablet Take 1 tablet (500 mg total) by mouth 2 (two) times daily. 20 tablet 0  . ondansetron (ZOFRAN) 4 MG tablet Take 1 tablet (4 mg total) by mouth every 6 (six) hours. 12 tablet 0  . promethazine (PHENERGAN) 25 MG tablet Take 1 tablet (25 mg total) by mouth every 6 (six) hours as needed for nausea. 20 tablet 0   No facility-administered medications prior to visit.      Past Medical History:  Diagnosis Date  . ADHD (attention deficit hyperactivity disorder)   . Arthritis   . Asthma   . Bipolar 1 disorder ([place])   . Bronchitis   . Drug-seeking behavior   . GERD (gastroesophageal reflux disease)   . Schizophrenia ([place])   . Seasonal allergies   . Substance abuse ([place])   . Suicide attempt [facility])       Past Surgical History:  Procedure Laterality Date  . FRACTURE SURGERY    . KNEE SURGERY        History reviewed. No pertinent family history.    Social History   Socioeconomic History  . Marital status: Married    Spouse name: Not on file  . Number of children: 4  . Years  of education: 14  . Highest education level: Not on file  Occupational History  . Not on file  Social Needs  . Financial resource strain: Not on file  . Food insecurity:    Worry: Not on file    Inability: Not on file  . Transportation needs:    Medical: Not on file    Non-medical: Not on file  Tobacco Use  . Smoking status: Former Smoker    Packs/day: 1.00    Years: 5.00    Pack years: 5.00    Types: Cigarettes  . Smokeless tobacco: Never Used  Substance and Sexual Activity  . Alcohol use: No  . Drug use: No    Types: Marijuana, Cocaine  . Sexual activity: Yes    Birth control/protection: None  Lifestyle  . Physical activity:    Days per week: Not on file    Minutes per session: Not  on file  . Stress: Not on file  Relationships  . Social connections:    Talks on phone: Not on file    Gets together: Not on file    Attends religious service: Not on file    Active member of club or organization: Not on file    Attends meetings of clubs or organizations: Not on file    Relationship status: Not on file  . Intimate partner violence:    Fear of current or ex partner: Not on file    Emotionally abused: Not on file    Physically abused: Not on file    Forced sexual activity: Not on file  Other Topics Concern  . Not on file  Social History Narrative   ** Merged History Encounter **          Review of Systems  Constitutional: Negative for activity change, appetite change, diaphoresis, fatigue, fever and unexpected weight change.  HENT: Negative for congestion, sinus pressure and sore throat.   Respiratory: Negative for cough, chest tightness, shortness of breath and wheezing.   Cardiovascular: Negative for chest pain and leg swelling.  Gastrointestinal: Negative for abdominal pain, constipation, diarrhea, nausea and vomiting.  Genitourinary: Negative for dysuria, flank pain, frequency, genital sores, hematuria and urgency.  Neurological: Negative for weakness and headaches.       Objective:    BP 136/87   Pulse 77   Temp 98.2 F (36.8 C) (Oral)   Ht 5' 9"  (1.753 m)   BMI 23.63 kg/m  Nursing note and vital signs reviewed.  Physical Exam  Constitutional: He is oriented to person, place, and time. He appears well-developed and well-nourished. No distress.  HENT:  Mouth/Throat: Oropharynx is clear and moist.  Eyes: Conjunctivae are normal.  Neck: Neck supple.  Cardiovascular: Normal rate, regular rhythm, normal heart sounds and intact distal pulses. Exam reveals no gallop and no friction rub.  No murmur heard. Pulmonary/Chest: Effort normal and breath sounds normal. No respiratory distress. He has no wheezes. He has no rales.  Abdominal: Soft. Bowel  sounds are normal. There is no rebound.  Lymphadenopathy:    He has no cervical adenopathy.  Neurological: He is alert and oriented to person, place, and time.  Skin: Skin is warm and dry. No rash noted.  Psychiatric: He has a normal mood and affect. His behavior is normal. Judgment and thought content normal.        Assessment & Plan:   Problem List Items Addressed This Visit      Other   HIV disease ([facility])  Previously diagnosed with HIV and not currently maintained on medications. No signs of opportunistic infection on exam. Current CD4 count of 580 with viral load of 9,000. Educated regarding transmission, progression, and treatments of HIV. Hepatitis B and influenza updated today. Will plan to start [place]. He is currently incarcerated and will be released at the end of April. ADAP and [place] are approved. His wife is also HIV positive and on medication. Educated importance of continued safe sex practices. Plan to repeat viral load and CD4 count in 1 month.       Relevant Medications   bictegravir-emtricitabine-tenofovir AF (BIKTARVY) 50-200-25 MG TABS tablet    Other Visit Diagnoses    Need for hepatitis B vaccination    -  Primary   Relevant Orders   Hepatitis B vaccine adult IM (Completed)   Need for immunization against influenza       Relevant Orders   Flu Vaccine QUAD 36+ mos IM (Completed)       I have discontinued Thelton L. Duskin's methocarbamol, promethazine, and ondansetron. I am also having him start on bictegravir-emtricitabine-tenofovir AF. Additionally, I am having him maintain his acetaminophen.   Meds ordered this encounter  Medications  . bictegravir-emtricitabine-tenofovir AF (BIKTARVY) 50-200-25 MG TABS tablet    Sig: Take 1 tablet by mouth daily.    Dispense:  30 tablet    Refill:  2    Order Specific Question:   Supervising Provider    Answer:   Carlyle Basques [[id number]]     Follow-up: Return in about 1 month (around 10/21/2017), or if  symptoms worsen or fail to improve.    Mauricio Po, [place] [facility]

## 2017-09-20 NOTE — Patient Instructions (Addendum)
Nice to meet you.   We will work to get you started on medication.  We will plan to follow up in about 1 month.  We will add a snack bag to your regimen.   If you have headaches, you can use Tylenol as needed

## 2017-09-20 NOTE — Progress Notes (Signed)
HPI: Fred Wilson is a 32 y.o. male who is here for his initial visit for HIV.   Allergies: Allergies  Allergen Reactions  . Bee Venom Anaphylaxis  . Dilaudid [Hydromorphone Hcl] Anaphylaxis, Hives and Itching  . Fentanyl Hives, Shortness Of Breath and Swelling  . Morphine And Related Hives, Shortness Of Breath and Swelling  . Olanzapine Hives  . Tramadol Hives, Shortness Of Breath and Swelling  . Aspirin Hives and Swelling    Hives and swollen glands  . Ibuprofen Hives and Swelling    Hives and swollen glands  . Morphine And Related Hives and Swelling    Hives and swollen glands  . Toradol [Ketorolac Tromethamine] Hives and Swelling    Glands swell  . Tramadol Hives and Swelling    Hives and swollen glands    Vitals: Temp: 98.2 F (36.8 C) (03/29 0846) Temp Source: Oral (03/29 0846) BP: 136/87 (03/29 0846) Pulse Rate: 77 (03/29 0846)  Past Medical History: Past Medical History:  Diagnosis Date  . ADHD (attention deficit hyperactivity disorder)   . Arthritis   . Asthma   . Bipolar 1 disorder ([facility])   . Bronchitis   . Drug-seeking behavior   . GERD (gastroesophageal reflux disease)   . Schizophrenia ([facility])   . Seasonal allergies   . Substance abuse ([facility])   . Suicide attempt [facility](HCC)     Social History: Social History   Socioeconomic History  . Marital status: Married    Spouse name: Not on file  . Number of children: 4  . Years of education: 6214  . Highest education level: Not on file  Occupational History  . Not on file  Social Needs  . Financial resource strain: Not on file  . Food insecurity:    Worry: Not on file    Inability: Not on file  . Transportation needs:    Medical: Not on file    Non-medical: Not on file  Tobacco Use  . Smoking status: Former Smoker    Packs/day: 1.00    Years: 5.00    Pack years: 5.00    Types: Cigarettes  . Smokeless tobacco: Never Used  Substance and Sexual Activity  . Alcohol use: No  . Drug use: No    Types:  Marijuana, Cocaine  . Sexual activity: Yes    Birth control/protection: None  Lifestyle  . Physical activity:    Days per week: Not on file    Minutes per session: Not on file  . Stress: Not on file  Relationships  . Social connections:    Talks on phone: Not on file    Gets together: Not on file    Attends religious service: Not on file    Active member of club or organization: Not on file    Attends meetings of clubs or organizations: Not on file    Relationship status: Not on file  Other Topics Concern  . Not on file  Social History Narrative   ** Merged History Encounter **        Previous Regimen: Genvoya  Current Regimen: None  Labs: HIV 1 RNA Quant (Copies/mL)  Date Value  09/06/2017 9,170 (H)   CD4 T Cell Abs (/uL)  Date Value  09/06/2017 580   Hep B S Ab (no units)  Date Value  09/06/2017 NON-REACTIVE   Hepatitis B Surface Ag (no units)  Date Value  09/06/2017 NON-REACTIVE    CrCl: CrCl cannot be calculated (Unknown ideal weight.).  Lipids:  Component Value Date/Time   CHOL 146 09/06/2017 1017   TRIG 281 (H) 09/06/2017 1017   HDL 40 (L) 09/06/2017 1017   CHOLHDL 3.7 09/06/2017 1017   LDLCALC 69 09/06/2017 1017    Assessment: Fred Wilson was dx with HIV in around 2016. He is still in jail now. He was seen at [facility] in 2017 but never started on his Genvoya. His ADAP has been approved. We will restart him back on Biktarvy today. Counseled him extensively on the importance adherence to avoid resistance and side effects of the medication. He requested that we put PRN Tylenol on his paper work for headaches because the jail will not dispense with out it.   Recommendations:  Start Biktarvy 1 PO qday F/u in 1 month  Fred Wilson, PharmD, BCPS, AAHIVP, CPP Clinical Infectious Disease Pharmacist [facility] 09/20/2017, 9:59 AM

## 2017-09-20 NOTE — Assessment & Plan Note (Signed)
Previously diagnosed with HIV and not currently maintained on medications. No signs of opportunistic infection on exam. Current CD4 count of 580 with viral load of 9,000. Educated regarding transmission, progression, and treatments of HIV. Hepatitis B and influenza updated today. Will plan to start Biktarvy. He is currently incarcerated and will be released at the end of April. ADAP and [place] [place] are approved. His wife is also HIV positive and on medication. Educated importance of continued safe sex practices. Plan to repeat viral load and CD4 count in 1 month.

## 2017-09-23 ENCOUNTER — Other Ambulatory Visit: Payer: Self-pay | Admitting: *Deleted

## 2017-09-23 DIAGNOSIS — B20 Human immunodeficiency virus [HIV] disease: Secondary | ICD-10-CM

## 2017-09-23 MED ORDER — BICTEGRAVIR-EMTRICITAB-TENOFOV 50-200-25 MG PO TABS: 1.0000 | ORAL_TABLET | Freq: Every day | ORAL | 2 refills | Status: DC

## 2017-09-24 ENCOUNTER — Encounter: Payer: Self-pay | Admitting: *Deleted

## 2017-10-10 ENCOUNTER — Ambulatory Visit: Admitting: Family

## 2017-10-16 NOTE — Progress Notes (Deleted)
   Subjective:    Patient ID: Fred Wilson, male    DOB: 10/13/1985, 32 y.o.   MRN: [id number]  No chief complaint on file.    HPI:  Fred Wilson is a 32 y.o. male who presents today for routine follow up of his HIV disease.  Fred Wilson was last seen in the office to establish care for his HIV disease and was noted to have a CD4 count of 580 and a viral load of 9,000. Genvoya was changed to [employer] Corporation[employer]iktarvy. He also received the first injection for the Hepatitis B series and is due for the second today.   Reports taking the Biktarvy as prescribed and denies adverse side effects or missed dosages. He has no problems obtaining or taking his medications. Denies fevers, chills, night sweats, headaches, changes in vision, neck pain/stiffness, nausea, diarrhea, vomiting, lesions or rashes.  Immunization History  Administered Date(s) Administered  . Hepatitis B, adult 09/20/2017  . Influenza,inj,Quad PF,6+ Mos 09/29/2015, 09/20/2017  . Pneumococcal Polysaccharide-23 01/29/2017      Allergies  Allergen Reactions  . Bee Venom Anaphylaxis  . Dilaudid [Hydromorphone Hcl] Anaphylaxis, Hives and Itching  . Fentanyl Hives, Shortness Of Breath and Swelling  . Morphine And Related Hives, Shortness Of Breath and Swelling  . Olanzapine Hives  . Tramadol Hives, Shortness Of Breath and Swelling  . Aspirin Hives and Swelling    Hives and swollen glands  . Ibuprofen Hives and Swelling    Hives and swollen glands  . Morphine And Related Hives and Swelling    Hives and swollen glands  . Toradol [Ketorolac Tromethamine] Hives and Swelling    Glands swell  . Tramadol Hives and Swelling    Hives and swollen glands      Outpatient Medications Prior to Visit  Medication Sig Dispense Refill  . acetaminophen (TYLENOL) 500 MG tablet Take 1 tablet (500 mg total) by mouth every 6 (six) hours as needed for mild pain, moderate pain or headache. 30 tablet 0  . bictegravir-emtricitabine-tenofovir AF (BIKTARVY)  50-200-25 MG TABS tablet Take 1 tablet by mouth daily. 30 tablet 2   No facility-administered medications prior to visit.      Past Medical History:  Diagnosis Date  . ADHD (attention deficit hyperactivity disorder)   . Arthritis   . Asthma   . Bipolar 1 disorder ([facility])   . Bronchitis   . Drug-seeking behavior   . GERD (gastroesophageal reflux disease)   . Schizophrenia ([facility])   . Seasonal allergies   . Substance abuse ([facility])   . Suicide attempt [facility](HCC)      Past Surgical History:  Procedure Laterality Date  . FRACTURE SURGERY    . KNEE SURGERY         Review of Systems    Objective:    There were no vitals taken for this visit. Nursing note and vital signs reviewed.  Physical Exam     Assessment & Plan:   Problem List Items Addressed This Visit    None       I am having Fred Wilson maintain his acetaminophen and bictegravir-emtricitabine-tenofovir AF.   No orders of the defined types were placed in this encounter.    Follow-up: No follow-ups on file.   Marcos EkeGreg Koreen Lizaola, MSN, [facility]NP-C [facility]

## 2017-10-17 ENCOUNTER — Ambulatory Visit: Admitting: Family

## 2017-10-21 ENCOUNTER — Ambulatory Visit (HOSPITAL_COMMUNITY)
Admission: EM | Admit: 2017-10-21 | Discharge: 2017-10-21 | Disposition: A | Discharge status: Home / Self Care | Attending: Internal Medicine | Admitting: Internal Medicine

## 2017-10-21 ENCOUNTER — Encounter (HOSPITAL_COMMUNITY): Payer: Self-pay | Admitting: Emergency Medicine

## 2017-10-21 DIAGNOSIS — T162XXA Foreign body in left ear, initial encounter: Secondary | ICD-10-CM

## 2017-10-21 DIAGNOSIS — H9202 Otalgia, left ear: Secondary | ICD-10-CM

## 2017-10-21 NOTE — Discharge Instructions (Addendum)
Please do not stick things in your ears.  Please return if you develop your pain, drainage, swelling of the ear.

## 2017-10-21 NOTE — ED Triage Notes (Signed)
Pt here with tissue stuck in left ear
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
## 2017-11-04 ENCOUNTER — Ambulatory Visit: Admitting: Family

## 2018-06-09 ENCOUNTER — Emergency Department (HOSPITAL_COMMUNITY): Payer: Medicaid Other

## 2018-06-09 ENCOUNTER — Encounter (HOSPITAL_COMMUNITY): Payer: Self-pay | Admitting: Emergency Medicine

## 2018-06-09 ENCOUNTER — Emergency Department (HOSPITAL_COMMUNITY)
Admission: EM | Admit: 2018-06-09 | Discharge: 2018-06-10 | Disposition: A | Discharge status: Home / Self Care | Payer: Medicaid Other | Attending: Emergency Medicine | Admitting: Emergency Medicine

## 2018-06-09 DIAGNOSIS — J069 Acute upper respiratory infection, unspecified: Secondary | ICD-10-CM | POA: Insufficient documentation

## 2018-06-09 DIAGNOSIS — R05 Cough: Secondary | ICD-10-CM

## 2018-06-09 DIAGNOSIS — B9789 Other viral agents as the cause of diseases classified elsewhere: Secondary | ICD-10-CM | POA: Insufficient documentation

## 2018-06-09 DIAGNOSIS — R059 Cough, unspecified: Secondary | ICD-10-CM

## 2018-06-09 DIAGNOSIS — J45909 Unspecified asthma, uncomplicated: Secondary | ICD-10-CM | POA: Insufficient documentation

## 2018-06-09 DIAGNOSIS — Z9114 Patient's other noncompliance with medication regimen: Secondary | ICD-10-CM | POA: Insufficient documentation

## 2018-06-09 DIAGNOSIS — Z21 Asymptomatic human immunodeficiency virus [HIV] infection status: Secondary | ICD-10-CM | POA: Insufficient documentation

## 2018-06-09 DIAGNOSIS — Z79899 Other long term (current) drug therapy: Secondary | ICD-10-CM | POA: Insufficient documentation

## 2018-06-09 DIAGNOSIS — Z87891 Personal history of nicotine dependence: Secondary | ICD-10-CM | POA: Insufficient documentation

## 2018-06-09 NOTE — ED Notes (Signed)
Pt refused vitals 

## 2018-06-09 NOTE — ED Triage Notes (Signed)
Pt from home, c/o generalized body aches, productive cough, chills and nausea.  Pt is not med compliant.

## 2018-06-10 LAB — COMPREHENSIVE METABOLIC PANEL
ALT: 16 U/L (ref 0–44)
ANION GAP: 10 (ref 5–15)
AST: 23 U/L (ref 15–41)
Albumin: 4.3 g/dL (ref 3.5–5.0)
Alkaline Phosphatase: 66 U/L (ref 38–126)
BUN: 5 mg/dL — ABNORMAL LOW (ref 6–20)
CALCIUM: 9.4 mg/dL (ref 8.9–10.3)
CHLORIDE: 104 mmol/L (ref 98–111)
CO2: 24 mmol/L (ref 22–32)
Creatinine, Ser: 0.87 mg/dL (ref 0.61–1.24)
Glucose, Bld: 107 mg/dL — ABNORMAL HIGH (ref 70–99)
Potassium: 4.1 mmol/L (ref 3.5–5.1)
SODIUM: 138 mmol/L (ref 135–145)
Total Bilirubin: 1 mg/dL (ref 0.3–1.2)
Total Protein: 8 g/dL (ref 6.5–8.1)

## 2018-06-10 LAB — CBC WITH DIFFERENTIAL/PLATELET
ABS IMMATURE GRANULOCYTES: 0.03 10*3/uL (ref 0.00–0.07)
BASOS PCT: 0 %
Basophils Absolute: 0 10*3/uL (ref 0.0–0.1)
EOS ABS: 0.1 10*3/uL (ref 0.0–0.5)
EOS PCT: 1 %
HCT: 43.4 % (ref 39.0–52.0)
Hemoglobin: 14 g/dL (ref 13.0–17.0)
Immature Granulocytes: 0 %
Lymphocytes Relative: 16 %
Lymphs Abs: 1.5 10*3/uL (ref 0.7–4.0)
MCH: 29.7 pg (ref 26.0–34.0)
MCHC: 32.3 g/dL (ref 30.0–36.0)
MCV: 91.9 fL (ref 80.0–100.0)
MONO ABS: 0.6 10*3/uL (ref 0.1–1.0)
MONOS PCT: 7 %
Neutro Abs: 6.8 10*3/uL (ref 1.7–7.7)
Neutrophils Relative %: 76 %
PLATELETS: 252 10*3/uL (ref 150–400)
RBC: 4.72 MIL/uL (ref 4.22–5.81)
RDW: 13.4 % (ref 11.5–15.5)
WBC: 9.1 10*3/uL (ref 4.0–10.5)
nRBC: 0 % (ref 0.0–0.2)

## 2018-06-10 LAB — URINALYSIS, ROUTINE W REFLEX MICROSCOPIC
BACTERIA UA: NONE SEEN
BILIRUBIN URINE: NEGATIVE
GLUCOSE, UA: NEGATIVE mg/dL
HGB URINE DIPSTICK: NEGATIVE
KETONES UR: NEGATIVE mg/dL
NITRITE: NEGATIVE
PROTEIN: NEGATIVE mg/dL
Specific Gravity, Urine: 1.024 (ref 1.005–1.030)
pH: 6 (ref 5.0–8.0)

## 2018-06-10 LAB — LIPASE, BLOOD: LIPASE: 23 U/L (ref 11–51)

## 2018-06-10 LAB — INFLUENZA PANEL BY PCR (TYPE A & B)
INFLAPCR: NEGATIVE
INFLBPCR: NEGATIVE

## 2018-06-10 LAB — I-STAT CG4 LACTIC ACID, ED: Lactic Acid, Venous: 1.25 mmol/L (ref 0.5–1.9)

## 2018-06-10 MED ORDER — ACETAMINOPHEN 325 MG PO TABS
650.0000 mg | ORAL_TABLET | Freq: Once | ORAL | Status: AC
  Administered 2018-06-10: 650 mg via ORAL
  Filled 2018-06-10: qty 2

## 2018-06-10 MED ORDER — DOXYCYCLINE HYCLATE 100 MG PO CAPS
100.0000 mg | ORAL_CAPSULE | Freq: Two times a day (BID) | ORAL | 0 refills | Status: DC
Start: 2018-06-10 — End: 2019-03-01

## 2018-06-10 NOTE — Discharge Instructions (Addendum)
Take the antibiotics as prescribed and followup with the ID clinic to get back on your medications. Keep yourself hydrated. Use tylenol as needed for pain and fever. Return to the ED with new or worsening symptoms.
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
## 2018-06-15 LAB — CULTURE, BLOOD (ROUTINE X 2)
CULTURE: NO GROWTH
Culture: NO GROWTH
SPECIAL REQUESTS: ADEQUATE

## 2018-08-05 ENCOUNTER — Encounter (HOSPITAL_COMMUNITY): Payer: Self-pay

## 2018-08-05 ENCOUNTER — Other Ambulatory Visit: Payer: Self-pay

## 2018-08-05 ENCOUNTER — Emergency Department (HOSPITAL_COMMUNITY)
Admission: EM | Admit: 2018-08-05 | Discharge: 2018-08-05 | Disposition: A | Discharge status: Home / Self Care | Payer: No Typology Code available for payment source | Attending: Emergency Medicine | Admitting: Emergency Medicine

## 2018-08-05 ENCOUNTER — Emergency Department (HOSPITAL_COMMUNITY): Payer: No Typology Code available for payment source

## 2018-08-05 DIAGNOSIS — Y999 Unspecified external cause status: Secondary | ICD-10-CM | POA: Diagnosis not present

## 2018-08-05 DIAGNOSIS — Y92480 Sidewalk as the place of occurrence of the external cause: Secondary | ICD-10-CM | POA: Diagnosis not present

## 2018-08-05 DIAGNOSIS — S7012XA Contusion of left thigh, initial encounter: Secondary | ICD-10-CM | POA: Diagnosis not present

## 2018-08-05 DIAGNOSIS — R52 Pain, unspecified: Secondary | ICD-10-CM

## 2018-08-05 DIAGNOSIS — Z87891 Personal history of nicotine dependence: Secondary | ICD-10-CM | POA: Diagnosis not present

## 2018-08-05 DIAGNOSIS — F250 Schizoaffective disorder, bipolar type: Secondary | ICD-10-CM | POA: Insufficient documentation

## 2018-08-05 DIAGNOSIS — B20 Human immunodeficiency virus [HIV] disease: Secondary | ICD-10-CM | POA: Diagnosis not present

## 2018-08-05 DIAGNOSIS — Y9301 Activity, walking, marching and hiking: Secondary | ICD-10-CM | POA: Insufficient documentation

## 2018-08-05 DIAGNOSIS — S79922A Unspecified injury of left thigh, initial encounter: Secondary | ICD-10-CM | POA: Diagnosis present

## 2018-08-05 MED ORDER — HYDROCODONE-ACETAMINOPHEN 5-325 MG PO TABS
1.0000 | ORAL_TABLET | Freq: Once | ORAL | Status: AC
  Administered 2018-08-05: 1 via ORAL
  Filled 2018-08-05: qty 1

## 2018-08-05 MED ORDER — HYDROCODONE-ACETAMINOPHEN 5-325 MG PO TABS: 2.0000 | ORAL_TABLET | Freq: Four times a day (QID) | ORAL | 0 refills | Status: DC | PRN
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
## 2018-08-05 NOTE — Discharge Instructions (Signed)
Please return for any problem.  Follow-up with your regular care provider as instructed. °
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
## 2018-08-08 ENCOUNTER — Emergency Department (HOSPITAL_COMMUNITY): Payer: No Typology Code available for payment source

## 2018-08-08 ENCOUNTER — Emergency Department (HOSPITAL_COMMUNITY)
Admission: EM | Admit: 2018-08-08 | Discharge: 2018-08-08 | Disposition: A | Discharge status: Home / Self Care | Payer: No Typology Code available for payment source | Attending: Emergency Medicine | Admitting: Emergency Medicine

## 2018-08-08 ENCOUNTER — Encounter (HOSPITAL_COMMUNITY): Payer: Self-pay | Admitting: Emergency Medicine

## 2018-08-08 DIAGNOSIS — F909 Attention-deficit hyperactivity disorder, unspecified type: Secondary | ICD-10-CM | POA: Insufficient documentation

## 2018-08-08 DIAGNOSIS — Z87891 Personal history of nicotine dependence: Secondary | ICD-10-CM | POA: Insufficient documentation

## 2018-08-08 DIAGNOSIS — S8992XA Unspecified injury of left lower leg, initial encounter: Secondary | ICD-10-CM

## 2018-08-08 DIAGNOSIS — M79605 Pain in left leg: Secondary | ICD-10-CM | POA: Insufficient documentation

## 2018-08-08 DIAGNOSIS — Z79899 Other long term (current) drug therapy: Secondary | ICD-10-CM | POA: Insufficient documentation

## 2018-08-08 MED ORDER — HYDROCODONE-ACETAMINOPHEN 5-325 MG PO TABS
1.0000 | ORAL_TABLET | Freq: Once | ORAL | Status: AC
  Administered 2018-08-08: 1 via ORAL
  Filled 2018-08-08: qty 1
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
## 2018-08-08 NOTE — Discharge Instructions (Addendum)
Follow up with the orthopedist provided. Ice and elevate the leg.

## 2018-08-11 ENCOUNTER — Other Ambulatory Visit: Payer: Self-pay

## 2018-08-11 ENCOUNTER — Encounter (HOSPITAL_COMMUNITY): Payer: Self-pay

## 2018-08-11 ENCOUNTER — Emergency Department (HOSPITAL_COMMUNITY)
Admission: EM | Admit: 2018-08-11 | Discharge: 2018-08-11 | Disposition: A | Discharge status: Home / Self Care | Payer: Medicaid Other | Attending: Emergency Medicine | Admitting: Emergency Medicine

## 2018-08-11 DIAGNOSIS — M545 Low back pain, unspecified: Secondary | ICD-10-CM

## 2018-08-11 DIAGNOSIS — Z21 Asymptomatic human immunodeficiency virus [HIV] infection status: Secondary | ICD-10-CM | POA: Insufficient documentation

## 2018-08-11 DIAGNOSIS — J45909 Unspecified asthma, uncomplicated: Secondary | ICD-10-CM | POA: Insufficient documentation

## 2018-08-11 DIAGNOSIS — Z87891 Personal history of nicotine dependence: Secondary | ICD-10-CM | POA: Insufficient documentation

## 2018-08-11 MED ORDER — ACETAMINOPHEN 500 MG PO TABS
1000.0000 mg | ORAL_TABLET | Freq: Once | ORAL | Status: AC
  Administered 2018-08-11: 1000 mg via ORAL
  Filled 2018-08-11: qty 2

## 2018-08-11 MED ORDER — DEXAMETHASONE 4 MG PO TABS
10.0000 mg | ORAL_TABLET | Freq: Once | ORAL | Status: AC
  Administered 2018-08-11: 10 mg via ORAL
  Filled 2018-08-11: qty 2

## 2018-08-11 MED ORDER — OXYCODONE HCL 5 MG PO TABS
5.0000 mg | ORAL_TABLET | Freq: Once | ORAL | Status: AC
  Administered 2018-08-11: 5 mg via ORAL
  Filled 2018-08-11: qty 1
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
## 2018-08-11 NOTE — Discharge Instructions (Signed)

## 2018-08-29 ENCOUNTER — Other Ambulatory Visit: Payer: Self-pay

## 2018-08-29 ENCOUNTER — Encounter (HOSPITAL_COMMUNITY): Payer: Self-pay | Admitting: Emergency Medicine

## 2018-08-29 ENCOUNTER — Emergency Department (HOSPITAL_COMMUNITY)
Admission: EM | Admit: 2018-08-29 | Discharge: 2018-08-29 | Disposition: A | Discharge status: Home / Self Care | Payer: Medicaid Other | Attending: Emergency Medicine | Admitting: Emergency Medicine

## 2018-08-29 DIAGNOSIS — M545 Low back pain, unspecified: Secondary | ICD-10-CM

## 2018-08-29 DIAGNOSIS — F909 Attention-deficit hyperactivity disorder, unspecified type: Secondary | ICD-10-CM | POA: Insufficient documentation

## 2018-08-29 DIAGNOSIS — Z79899 Other long term (current) drug therapy: Secondary | ICD-10-CM | POA: Insufficient documentation

## 2018-08-29 DIAGNOSIS — Z87891 Personal history of nicotine dependence: Secondary | ICD-10-CM | POA: Insufficient documentation

## 2018-08-29 DIAGNOSIS — J45909 Unspecified asthma, uncomplicated: Secondary | ICD-10-CM | POA: Insufficient documentation

## 2018-08-29 MED ORDER — ACETAMINOPHEN 500 MG PO TABS
1000.0000 mg | ORAL_TABLET | Freq: Once | ORAL | Status: AC
  Administered 2018-08-29: 1000 mg via ORAL
  Filled 2018-08-29: qty 2

## 2018-08-29 MED ORDER — LIDOCAINE 5 % EX PTCH
1.0000 | MEDICATED_PATCH | Freq: Once | CUTANEOUS | Status: DC
  Administered 2018-08-29: 1 via TRANSDERMAL
  Filled 2018-08-29: qty 1

## 2018-08-29 MED ORDER — METHOCARBAMOL 500 MG PO TABS: 500.0000 mg | ORAL_TABLET | Freq: Two times a day (BID) | ORAL | 0 refills | Status: DC
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
## 2018-08-29 NOTE — Discharge Instructions (Signed)
You were seen here today for Back Pain: Low back pain is discomfort in the lower back that may be due to injuries to muscles and ligaments around the spine. Occasionally, it may be caused by a problem to a part of the spine called a disc. Your back pain should be treated with medicines listed below as well as back exercises and this back pain should get better over the next 2 weeks. Most patients get completely well in 4 weeks. It is important to know however, if you develop severe or worsening pain, low back pain with fever, numbness, weakness or inability to walk or urinate, you should return to the ER immediately.  Please follow up with your doctor this week for a recheck if still having symptoms.  HOME INSTRUCTIONS Self - care:  The application of heat can help soothe the pain.  Maintaining your daily activities, including walking (this is encouraged), as it will help you get better faster than just staying in bed. Do not life, push, pull anything more than 10 pounds for the next week. I am attaching back exercises that you can do at home to help facilitate your recovery.   Back Exercises - I have attached a handout on back exercises that can be done at home to help facilitate your recovery.   Medications are also useful to help with pain control.   Acetaminophen.  This medication is generally safe, and found over the counter. Take as directed for your age. You should not take more than 8 of the extra strength (500mg ) pills a day (max dose is 4000mg  total OVER one day)  Muscle relaxants:  These medications can help with muscle tightness that is a cause of lower back pain.  Most of these medications can cause drowsiness, and it is not safe to drive or use dangerous machinery while taking them. They are primarily helpful when taken at night before sleep.  You will need to follow up with your primary healthcare provider or the Orthopedist in 1-2 weeks for reassessment and persistent symptoms.  Be  aware that if you develop new symptoms, such as a fever, leg weakness, difficulty with or loss of control of your urine or bowels, abdominal pain, or more severe pain, you will need to seek medical attention and/or return to the Emergency department. Additional Information:  Your vital signs today were: BP (!) 146/87 (BP Location: Right Arm)    Pulse 90    Temp (!) 97.4 F (36.3 C) (Oral)    Resp 20    Ht 5\' 9"  (1.753 m)    Wt 82 kg    SpO2 99%    BMI 26.70 kg/m  If your blood pressure (BP) was elevated above 135/85 this visit, please have this repeated by your doctor within one month. ---------------

## 2018-10-07 ENCOUNTER — Telehealth: Payer: Self-pay

## 2018-10-07 NOTE — Telephone Encounter (Signed)
Patient is due for appointment, last visit 08/2017 and has a history of lapse in care due incarceration in 2017.   Called [facility], number is not in service at this moment.

## 2018-10-17 NOTE — Telephone Encounter (Signed)
Called patient states call can not be completed at this time   Fred Wilson  Referral Coordinator

## 2019-01-14 ENCOUNTER — Telehealth: Payer: Self-pay | Admitting: *Deleted

## 2019-01-14 NOTE — Telephone Encounter (Signed)
Bridge counseling referral per viral load list. Mali Eppard M, RN  

## 2019-03-01 ENCOUNTER — Emergency Department (HOSPITAL_COMMUNITY): Payer: Self-pay

## 2019-03-01 ENCOUNTER — Emergency Department (HOSPITAL_COMMUNITY)
Admission: EM | Admit: 2019-03-01 | Discharge: 2019-03-01 | Disposition: A | Discharge status: Home / Self Care | Payer: Self-pay | Attending: Emergency Medicine | Admitting: Emergency Medicine

## 2019-03-01 ENCOUNTER — Encounter (HOSPITAL_COMMUNITY): Payer: Self-pay

## 2019-03-01 DIAGNOSIS — B20 Human immunodeficiency virus [HIV] disease: Secondary | ICD-10-CM | POA: Insufficient documentation

## 2019-03-01 DIAGNOSIS — L03012 Cellulitis of left finger: Secondary | ICD-10-CM | POA: Insufficient documentation

## 2019-03-01 DIAGNOSIS — Z79899 Other long term (current) drug therapy: Secondary | ICD-10-CM | POA: Insufficient documentation

## 2019-03-01 DIAGNOSIS — Z87891 Personal history of nicotine dependence: Secondary | ICD-10-CM | POA: Insufficient documentation

## 2019-03-01 DIAGNOSIS — J45909 Unspecified asthma, uncomplicated: Secondary | ICD-10-CM | POA: Insufficient documentation

## 2019-03-01 MED ORDER — ACETAMINOPHEN 325 MG PO TABS
650.0000 mg | ORAL_TABLET | Freq: Once | ORAL | Status: AC
  Administered 2019-03-01: 17:00:00 650 mg via ORAL
  Filled 2019-03-01: qty 2

## 2019-03-01 MED ORDER — DOXYCYCLINE HYCLATE 100 MG PO TABS: 100.0000 mg | ORAL_TABLET | Freq: Two times a day (BID) | ORAL | 0 refills | Status: DC

## 2019-03-01 MED ORDER — DOXYCYCLINE HYCLATE 100 MG PO TABS
100.0000 mg | ORAL_TABLET | Freq: Once | ORAL | Status: AC
  Administered 2019-03-01: 100 mg via ORAL
  Filled 2019-03-01: qty 1

## 2019-03-01 NOTE — ED Triage Notes (Signed)
Pt states he has had swelling to left index finger x 1 week. Pt denies injury/trauma.
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
## 2019-03-01 NOTE — Discharge Instructions (Signed)
Continue to soak your finger in warm water.  Take over-the-counter pain medications as needed.  Take the antibiotics as prescribed.  The symptoms should be improving in the next several days.  Return as needed for worsening symptoms.

## 2019-03-22 ENCOUNTER — Other Ambulatory Visit: Payer: Self-pay

## 2019-03-22 ENCOUNTER — Encounter (HOSPITAL_COMMUNITY): Payer: Self-pay | Admitting: Emergency Medicine

## 2019-03-22 ENCOUNTER — Emergency Department (HOSPITAL_COMMUNITY): Payer: Self-pay

## 2019-03-22 ENCOUNTER — Emergency Department (HOSPITAL_COMMUNITY)
Admission: EM | Admit: 2019-03-22 | Discharge: 2019-03-23 | Disposition: A | Discharge status: Home / Self Care | Payer: Self-pay | Attending: Emergency Medicine | Admitting: Emergency Medicine

## 2019-03-22 DIAGNOSIS — Y939 Activity, unspecified: Secondary | ICD-10-CM | POA: Insufficient documentation

## 2019-03-22 DIAGNOSIS — Z87891 Personal history of nicotine dependence: Secondary | ICD-10-CM | POA: Insufficient documentation

## 2019-03-22 DIAGNOSIS — M25532 Pain in left wrist: Secondary | ICD-10-CM | POA: Insufficient documentation

## 2019-03-22 DIAGNOSIS — Z21 Asymptomatic human immunodeficiency virus [HIV] infection status: Secondary | ICD-10-CM | POA: Insufficient documentation

## 2019-03-22 DIAGNOSIS — S022XXA Fracture of nasal bones, initial encounter for closed fracture: Secondary | ICD-10-CM | POA: Insufficient documentation

## 2019-03-22 DIAGNOSIS — J45909 Unspecified asthma, uncomplicated: Secondary | ICD-10-CM | POA: Insufficient documentation

## 2019-03-22 DIAGNOSIS — Y999 Unspecified external cause status: Secondary | ICD-10-CM | POA: Insufficient documentation

## 2019-03-22 DIAGNOSIS — M79651 Pain in right thigh: Secondary | ICD-10-CM | POA: Insufficient documentation

## 2019-03-22 DIAGNOSIS — Z79899 Other long term (current) drug therapy: Secondary | ICD-10-CM | POA: Insufficient documentation

## 2019-03-22 DIAGNOSIS — Y929 Unspecified place or not applicable: Secondary | ICD-10-CM | POA: Insufficient documentation

## 2019-03-22 LAB — CBC WITH DIFFERENTIAL/PLATELET
Abs Immature Granulocytes: 0.06 10*3/uL (ref 0.00–0.07)
Basophils Absolute: 0 10*3/uL (ref 0.0–0.1)
Basophils Relative: 0 %
Eosinophils Absolute: 0 10*3/uL (ref 0.0–0.5)
Eosinophils Relative: 0 %
HCT: 41.1 % (ref 39.0–52.0)
Hemoglobin: 14.2 g/dL (ref 13.0–17.0)
Immature Granulocytes: 0 %
Lymphocytes Relative: 5 %
Lymphs Abs: 0.9 10*3/uL (ref 0.7–4.0)
MCH: 32.1 pg (ref 26.0–34.0)
MCHC: 34.5 g/dL (ref 30.0–36.0)
MCV: 93 fL (ref 80.0–100.0)
Monocytes Absolute: 0.6 10*3/uL (ref 0.1–1.0)
Monocytes Relative: 4 %
Neutro Abs: 15.4 10*3/uL — ABNORMAL HIGH (ref 1.7–7.7)
Neutrophils Relative %: 91 %
Platelets: 280 10*3/uL (ref 150–400)
RBC: 4.42 MIL/uL (ref 4.22–5.81)
RDW: 14 % (ref 11.5–15.5)
WBC: 17.1 10*3/uL — ABNORMAL HIGH (ref 4.0–10.5)
nRBC: 0 % (ref 0.0–0.2)

## 2019-03-22 LAB — COMPREHENSIVE METABOLIC PANEL
ALT: 15 U/L (ref 0–44)
AST: 30 U/L (ref 15–41)
Albumin: 4.3 g/dL (ref 3.5–5.0)
Alkaline Phosphatase: 65 U/L (ref 38–126)
Anion gap: 9 (ref 5–15)
BUN: <5 mg/dL — ABNORMAL LOW (ref 6–20)
CO2: 26 mmol/L (ref 22–32)
Calcium: 9.7 mg/dL (ref 8.9–10.3)
Chloride: 100 mmol/L (ref 98–111)
Creatinine, Ser: 1.2 mg/dL (ref 0.61–1.24)
GFR calc Af Amer: >60 mL/min (ref >60)
GFR calc non Af Amer: >60 mL/min (ref >60)
Glucose, Bld: 115 mg/dL — ABNORMAL HIGH (ref 70–99)
Potassium: 4.3 mmol/L (ref 3.5–5.1)
Sodium: 135 mmol/L (ref 135–145)
Total Bilirubin: 1 mg/dL (ref 0.3–1.2)
Total Protein: 7.9 g/dL (ref 6.5–8.1)

## 2019-03-22 LAB — URINALYSIS, ROUTINE W REFLEX MICROSCOPIC
Bilirubin Urine: NEGATIVE
Glucose, UA: NEGATIVE mg/dL
Hgb urine dipstick: NEGATIVE
Ketones, ur: NEGATIVE mg/dL
Leukocytes,Ua: NEGATIVE
Nitrite: NEGATIVE
Protein, ur: NEGATIVE mg/dL
Specific Gravity, Urine: 1.023 (ref 1.005–1.030)
pH: 5 (ref 5.0–8.0)
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
## 2019-03-23 ENCOUNTER — Emergency Department (HOSPITAL_COMMUNITY): Payer: Self-pay

## 2019-03-23 MED ORDER — OXYCODONE-ACETAMINOPHEN 5-325 MG PO TABS
2.0000 | ORAL_TABLET | Freq: Once | ORAL | Status: AC
  Administered 2019-03-23: 2 via ORAL
  Filled 2019-03-23: qty 2

## 2019-03-23 MED ORDER — OXYCODONE-ACETAMINOPHEN 5-325 MG PO TABS: 2.0000 | ORAL_TABLET | ORAL | 0 refills | Status: DC | PRN

## 2019-03-23 NOTE — ED Notes (Signed)
Pt verbalized understanding of discharge paperwork, prescriptions and follow-up care 
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
## 2019-03-23 NOTE — ED Notes (Signed)
Pt is still upset and is upset because we are not providing medicine for him here in the waiting room. Pt reassured that we are getting to our patients as fast and as safe as possible. Pt still loud with staff

## 2019-03-23 NOTE — ED Notes (Signed)
Pt is upset, being loud in the lobby. Pt asked to remain patient. Pt approached by staff to address concerns and pt is stating that there will be a complaint started. Pt states that there will be a complaint to the hospital leadership as he feels as if we are not moving fast enough or skipping patients. Charge aware.

## 2019-03-31 ENCOUNTER — Emergency Department (HOSPITAL_COMMUNITY)
Admission: EM | Admit: 2019-03-31 | Discharge: 2019-03-31 | Disposition: A | Discharge status: Home / Self Care | Payer: Self-pay | Attending: Emergency Medicine | Admitting: Emergency Medicine

## 2019-03-31 ENCOUNTER — Emergency Department (HOSPITAL_COMMUNITY)
Admission: EM | Admit: 2019-03-31 | Discharge: 2019-04-01 | Disposition: A | Discharge status: Home / Self Care | Payer: Medicaid Other | Attending: Emergency Medicine | Admitting: Emergency Medicine

## 2019-03-31 ENCOUNTER — Other Ambulatory Visit: Payer: Self-pay

## 2019-03-31 ENCOUNTER — Encounter (HOSPITAL_COMMUNITY): Payer: Self-pay

## 2019-03-31 ENCOUNTER — Emergency Department (HOSPITAL_COMMUNITY): Payer: Self-pay

## 2019-03-31 DIAGNOSIS — Z79899 Other long term (current) drug therapy: Secondary | ICD-10-CM | POA: Insufficient documentation

## 2019-03-31 DIAGNOSIS — R109 Unspecified abdominal pain: Secondary | ICD-10-CM | POA: Insufficient documentation

## 2019-03-31 DIAGNOSIS — B20 Human immunodeficiency virus [HIV] disease: Secondary | ICD-10-CM | POA: Insufficient documentation

## 2019-03-31 DIAGNOSIS — N342 Other urethritis: Secondary | ICD-10-CM | POA: Insufficient documentation

## 2019-03-31 DIAGNOSIS — Z87891 Personal history of nicotine dependence: Secondary | ICD-10-CM | POA: Insufficient documentation

## 2019-03-31 LAB — CBC WITH DIFFERENTIAL/PLATELET
Abs Immature Granulocytes: 0.02 10*3/uL (ref 0.00–0.07)
Basophils Absolute: 0 10*3/uL (ref 0.0–0.1)
Basophils Relative: 0 %
Eosinophils Absolute: 0.2 10*3/uL (ref 0.0–0.5)
Eosinophils Relative: 2 %
HCT: 41.8 % (ref 39.0–52.0)
Hemoglobin: 13.8 g/dL (ref 13.0–17.0)
Immature Granulocytes: 0 %
Lymphocytes Relative: 15 %
Lymphs Abs: 1.3 10*3/uL (ref 0.7–4.0)
MCH: 31.2 pg (ref 26.0–34.0)
MCHC: 33 g/dL (ref 30.0–36.0)
MCV: 94.6 fL (ref 80.0–100.0)
Monocytes Absolute: 0.5 10*3/uL (ref 0.1–1.0)
Monocytes Relative: 5 %
Neutro Abs: 6.6 10*3/uL (ref 1.7–7.7)
Neutrophils Relative %: 78 %
Platelets: 257 10*3/uL (ref 150–400)
RBC: 4.42 MIL/uL (ref 4.22–5.81)
RDW: 13.9 % (ref 11.5–15.5)
WBC: 8.6 10*3/uL (ref 4.0–10.5)
nRBC: 0 % (ref 0.0–0.2)

## 2019-03-31 LAB — URINALYSIS, ROUTINE W REFLEX MICROSCOPIC
Bilirubin Urine: NEGATIVE
Glucose, UA: NEGATIVE mg/dL
Hgb urine dipstick: NEGATIVE
Ketones, ur: NEGATIVE mg/dL
Leukocytes,Ua: NEGATIVE
Nitrite: NEGATIVE
Protein, ur: NEGATIVE mg/dL
Specific Gravity, Urine: 1.027 (ref 1.005–1.030)
pH: 6 (ref 5.0–8.0)

## 2019-03-31 LAB — COMPREHENSIVE METABOLIC PANEL
ALT: 15 U/L (ref 0–44)
AST: 25 U/L (ref 15–41)
Albumin: 4.3 g/dL (ref 3.5–5.0)
Alkaline Phosphatase: 71 U/L (ref 38–126)
Anion gap: 15 (ref 5–15)
BUN: 13 mg/dL (ref 6–20)
CO2: 19 mmol/L — ABNORMAL LOW (ref 22–32)
Calcium: 9.1 mg/dL (ref 8.9–10.3)
Chloride: 106 mmol/L (ref 98–111)
Creatinine, Ser: 0.97 mg/dL (ref 0.61–1.24)
GFR calc Af Amer: >60 mL/min (ref >60)
GFR calc non Af Amer: >60 mL/min (ref >60)
Glucose, Bld: 96 mg/dL (ref 70–99)
Potassium: 3.7 mmol/L (ref 3.5–5.1)
Sodium: 140 mmol/L (ref 135–145)
Total Bilirubin: 0.8 mg/dL (ref 0.3–1.2)
Total Protein: 7.9 g/dL (ref 6.5–8.1)

## 2019-03-31 LAB — T-HELPER CELLS (CD4) COUNT (NOT AT ARMC)
CD4 % Helper T Cell: 36 % (ref 33–65)
CD4 T Cell Abs: 455 /uL (ref 400–1790)

## 2019-03-31 LAB — LIPASE, BLOOD: Lipase: 21 U/L (ref 11–51)

## 2019-03-31 MED ORDER — FENTANYL CITRATE (PF) 100 MCG/2ML IJ SOLN
50.0000 ug | Freq: Once | INTRAMUSCULAR | Status: AC
  Administered 2019-03-31: 08:00:00 50 ug via INTRAVENOUS
  Filled 2019-03-31: qty 2

## 2019-03-31 MED ORDER — ONDANSETRON HCL 4 MG/2ML IJ SOLN
4.0000 mg | Freq: Once | INTRAMUSCULAR | Status: AC
  Administered 2019-03-31: 4 mg via INTRAVENOUS
  Filled 2019-03-31: qty 2

## 2019-03-31 MED ORDER — SODIUM CHLORIDE (PF) 0.9 % IJ SOLN
INTRAMUSCULAR | Status: AC
  Administered 2019-03-31: 11:00:00
  Filled 2019-03-31: qty 50

## 2019-03-31 MED ORDER — IOHEXOL 300 MG/ML  SOLN
100.0000 mL | Freq: Once | INTRAMUSCULAR | Status: AC | PRN
  Administered 2019-03-31: 100 mL via INTRAVENOUS

## 2019-03-31 MED ORDER — METHOCARBAMOL 500 MG PO TABS: 500.0000 mg | ORAL_TABLET | Freq: Two times a day (BID) | ORAL | 0 refills | Status: DC

## 2019-03-31 MED ORDER — LIDOCAINE 5 % EX PTCH: 1.0000 | MEDICATED_PATCH | CUTANEOUS | 0 refills | Status: DC

## 2019-03-31 MED ORDER — METHOCARBAMOL 500 MG PO TABS
500.0000 mg | ORAL_TABLET | Freq: Once | ORAL | Status: AC
  Administered 2019-03-31: 500 mg via ORAL
  Filled 2019-03-31: qty 1

## 2019-03-31 MED ORDER — SODIUM CHLORIDE 0.9% FLUSH: 3.0000 mL | Freq: Once | INTRAVENOUS | Status: DC

## 2019-03-31 MED ORDER — SODIUM CHLORIDE 0.9 % IV BOLUS
1000.0000 mL | Freq: Once | INTRAVENOUS | Status: AC
  Administered 2019-03-31: 08:00:00 1000 mL via INTRAVENOUS

## 2019-03-31 NOTE — ED Triage Notes (Signed)
Pt arrived with complaints of bilateral flank pain over the last two weeks, reporting increased pain with urination. No blood noted in urine. No medications taken at home.

## 2019-03-31 NOTE — Discharge Instructions (Signed)
Take medications as prescribed.  Follow-up with infectious disease.  Their number is listed on your discharge paperwork.
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
## 2019-04-01 ENCOUNTER — Other Ambulatory Visit: Payer: Self-pay

## 2019-04-01 LAB — GC/CHLAMYDIA PROBE AMP (~~LOC~~) NOT AT ARMC
Chlamydia: NEGATIVE
Neisseria Gonorrhea: NEGATIVE

## 2019-04-01 LAB — CBC
HCT: 37.8 % — ABNORMAL LOW (ref 39.0–52.0)
Hemoglobin: 12.8 g/dL — ABNORMAL LOW (ref 13.0–17.0)
MCH: 31.8 pg (ref 26.0–34.0)
MCHC: 33.9 g/dL (ref 30.0–36.0)
MCV: 94 fL (ref 80.0–100.0)
Platelets: 237 10*3/uL (ref 150–400)
RBC: 4.02 MIL/uL — ABNORMAL LOW (ref 4.22–5.81)
RDW: 13.9 % (ref 11.5–15.5)
WBC: 8.1 10*3/uL (ref 4.0–10.5)
nRBC: 0 % (ref 0.0–0.2)

## 2019-04-01 LAB — COMPREHENSIVE METABOLIC PANEL
ALT: 19 U/L (ref 0–44)
AST: 27 U/L (ref 15–41)
Albumin: 3.8 g/dL (ref 3.5–5.0)
Alkaline Phosphatase: 66 U/L (ref 38–126)
Anion gap: 8 (ref 5–15)
BUN: 11 mg/dL (ref 6–20)
CO2: 24 mmol/L (ref 22–32)
Calcium: 9.1 mg/dL (ref 8.9–10.3)
Chloride: 105 mmol/L (ref 98–111)
Creatinine, Ser: 0.97 mg/dL (ref 0.61–1.24)
GFR calc Af Amer: >60 mL/min (ref >60)
GFR calc non Af Amer: >60 mL/min (ref >60)
Glucose, Bld: 105 mg/dL — ABNORMAL HIGH (ref 70–99)
Potassium: 3.9 mmol/L (ref 3.5–5.1)
Sodium: 137 mmol/L (ref 135–145)
Total Bilirubin: 0.7 mg/dL (ref 0.3–1.2)
Total Protein: 7.1 g/dL (ref 6.5–8.1)

## 2019-04-01 LAB — LIPASE, BLOOD: Lipase: 24 U/L (ref 11–51)

## 2019-04-01 LAB — URINALYSIS, ROUTINE W REFLEX MICROSCOPIC
Bilirubin Urine: NEGATIVE
Glucose, UA: NEGATIVE mg/dL
Hgb urine dipstick: NEGATIVE
Ketones, ur: NEGATIVE mg/dL
Leukocytes,Ua: NEGATIVE
Nitrite: NEGATIVE
Protein, ur: NEGATIVE mg/dL
Specific Gravity, Urine: 1.032 — ABNORMAL HIGH (ref 1.005–1.030)
pH: 5 (ref 5.0–8.0)

## 2019-04-01 MED ORDER — OXYCODONE-ACETAMINOPHEN 5-325 MG PO TABS
1.0000 | ORAL_TABLET | Freq: Once | ORAL | Status: AC
  Administered 2019-04-01: 05:00:00 1 via ORAL
  Filled 2019-04-01: qty 1
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
## 2019-04-01 NOTE — ED Triage Notes (Signed)
Pt c/o abdominal pain X2 weeks. States it hurts more w/ urination.  Pt was seen at [facility] and "the medication they gave me didn't work."

## 2019-04-01 NOTE — Discharge Instructions (Addendum)
Alternate ice and heat to areas of injury 3-4 times per day to limit inflammation and spasm.  Avoid strenuous activity and heavy lifting.  We recommend consistent use of Tylenol in addition to previously prescribed Robaxin for muscle spasms.  We recommend follow-up with urology if painful urination persists.  Continue follow-up with infectious disease for management of your HIV.  Return to the ED for any new or concerning symptoms.

## 2019-04-04 ENCOUNTER — Other Ambulatory Visit: Payer: Self-pay

## 2019-04-04 ENCOUNTER — Encounter (HOSPITAL_COMMUNITY): Payer: Self-pay | Admitting: Emergency Medicine

## 2019-04-04 ENCOUNTER — Emergency Department (HOSPITAL_COMMUNITY)
Admission: EM | Admit: 2019-04-04 | Discharge: 2019-04-06 | Disposition: A | Discharge status: Home / Self Care | Payer: Medicaid Other | Attending: Emergency Medicine | Admitting: Emergency Medicine

## 2019-04-04 DIAGNOSIS — B20 Human immunodeficiency virus [HIV] disease: Secondary | ICD-10-CM | POA: Insufficient documentation

## 2019-04-04 DIAGNOSIS — F250 Schizoaffective disorder, bipolar type: Secondary | ICD-10-CM | POA: Insufficient documentation

## 2019-04-04 DIAGNOSIS — F14151 Cocaine abuse with cocaine-induced psychotic disorder with hallucinations: Secondary | ICD-10-CM

## 2019-04-04 DIAGNOSIS — F329 Major depressive disorder, single episode, unspecified: Secondary | ICD-10-CM

## 2019-04-04 DIAGNOSIS — F909 Attention-deficit hyperactivity disorder, unspecified type: Secondary | ICD-10-CM | POA: Insufficient documentation

## 2019-04-04 DIAGNOSIS — F32A Depression, unspecified: Secondary | ICD-10-CM

## 2019-04-04 DIAGNOSIS — Z87891 Personal history of nicotine dependence: Secondary | ICD-10-CM | POA: Insufficient documentation

## 2019-04-04 DIAGNOSIS — Z8709 Personal history of other diseases of the respiratory system: Secondary | ICD-10-CM | POA: Insufficient documentation

## 2019-04-04 DIAGNOSIS — Z20828 Contact with and (suspected) exposure to other viral communicable diseases: Secondary | ICD-10-CM | POA: Insufficient documentation

## 2019-04-04 DIAGNOSIS — F14159 Cocaine abuse with cocaine-induced psychotic disorder, unspecified: Secondary | ICD-10-CM | POA: Diagnosis present

## 2019-04-04 HISTORY — DX: Asymptomatic human immunodeficiency virus (hiv) infection status: Z21

## 2019-04-04 HISTORY — DX: Human immunodeficiency virus (HIV) disease: B20

## 2019-04-04 LAB — CBC
HCT: 42.2 % (ref 39.0–52.0)
Hemoglobin: 14.3 g/dL (ref 13.0–17.0)
MCH: 31.9 pg (ref 26.0–34.0)
MCHC: 33.9 g/dL (ref 30.0–36.0)
MCV: 94.2 fL (ref 80.0–100.0)
Platelets: 261 10*3/uL (ref 150–400)
RBC: 4.48 MIL/uL (ref 4.22–5.81)
RDW: 13.7 % (ref 11.5–15.5)
WBC: 9.5 10*3/uL (ref 4.0–10.5)
nRBC: 0 % (ref 0.0–0.2)

## 2019-04-04 NOTE — Progress Notes (Addendum)
Fred Romp, NP recommends inpt tx. TTS to seek placement due to no appropriate beds at [facility] per [facility].   Pt's nurse Magda Bernheim, RN has been advised, states pt is not in a room and has not been seen by a provider yet.  Lind Covert, MSW, LCSW Therapeutic Triage Specialist  [phone]
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
## 2019-04-04 NOTE — Progress Notes (Signed)
TTS spoke with Magda Bernheim, RN who states she will place telepsych cart in pt's room in 5 mins.  Lind Covert, MSW, LCSW Therapeutic Triage Specialist  [phone]

## 2019-04-04 NOTE — ED Notes (Signed)
Pt denies any suicidal thought.

## 2019-04-04 NOTE — ED Triage Notes (Signed)
Pt having increase depression for the past 3 months and he doesn't feel safe at home, pt states he is using more drugs lately, no family support.

## 2019-04-04 NOTE — Progress Notes (Signed)
TTS calling telepsych cart, no answer.  Lind Covert, MSW, LCSW Therapeutic Triage Specialist  [phone]

## 2019-04-05 ENCOUNTER — Encounter (HOSPITAL_COMMUNITY): Payer: Self-pay | Admitting: *Deleted

## 2019-04-05 LAB — COMPREHENSIVE METABOLIC PANEL
ALT: 14 U/L (ref 0–44)
AST: 24 U/L (ref 15–41)
Albumin: 4.1 g/dL (ref 3.5–5.0)
Alkaline Phosphatase: 67 U/L (ref 38–126)
Anion gap: 8 (ref 5–15)
BUN: 7 mg/dL (ref 6–20)
CO2: 26 mmol/L (ref 22–32)
Calcium: 9.5 mg/dL (ref 8.9–10.3)
Chloride: 105 mmol/L (ref 98–111)
Creatinine, Ser: 1 mg/dL (ref 0.61–1.24)
GFR calc Af Amer: >60 mL/min (ref >60)
GFR calc non Af Amer: >60 mL/min (ref >60)
Glucose, Bld: 104 mg/dL — ABNORMAL HIGH (ref 70–99)
Potassium: 4.1 mmol/L (ref 3.5–5.1)
Sodium: 139 mmol/L (ref 135–145)
Total Bilirubin: 0.3 mg/dL (ref 0.3–1.2)
Total Protein: 7.9 g/dL (ref 6.5–8.1)

## 2019-04-05 LAB — RAPID URINE DRUG SCREEN, HOSP PERFORMED
Amphetamines: NOT DETECTED
Barbiturates: NOT DETECTED
Benzodiazepines: NOT DETECTED
Cocaine: NOT DETECTED
Opiates: NOT DETECTED
Tetrahydrocannabinol: POSITIVE — AB

## 2019-04-05 LAB — SALICYLATE LEVEL: Salicylate Lvl: <7 mg/dL (ref 2.8–30.0)

## 2019-04-05 LAB — ACETAMINOPHEN LEVEL: Acetaminophen (Tylenol), Serum: <10 ug/mL — ABNORMAL LOW (ref 10–30)

## 2019-04-05 LAB — SARS CORONAVIRUS 2 BY RT PCR (HOSPITAL ORDER, PERFORMED IN ~~LOC~~ HOSPITAL LAB): SARS Coronavirus 2: NEGATIVE

## 2019-04-05 LAB — ETHANOL: Alcohol, Ethyl (B): <10 mg/dL (ref <10)

## 2019-04-05 MED ORDER — ONDANSETRON 4 MG PO TBDP
8.0000 mg | ORAL_TABLET | Freq: Once | ORAL | Status: AC
  Administered 2019-04-05: 8 mg via ORAL
  Filled 2019-04-05: qty 2

## 2019-04-05 MED ORDER — BICTEGRAVIR-EMTRICITAB-TENOFOV 50-200-25 MG PO TABS
1.0000 | ORAL_TABLET | Freq: Every day | ORAL | Status: DC
  Administered 2019-04-05 – 2019-04-06 (×2): 1 via ORAL
  Filled 2019-04-05 (×4): qty 1

## 2019-04-05 MED ORDER — ONDANSETRON HCL 4 MG PO TABS: 4.0000 mg | ORAL_TABLET | Freq: Three times a day (TID) | ORAL | Status: DC | PRN

## 2019-04-05 MED ORDER — ARIPIPRAZOLE 5 MG PO TABS: 5.0000 mg | ORAL_TABLET | Freq: Every day | ORAL | Status: DC

## 2019-04-05 MED ORDER — NICOTINE 21 MG/24HR TD PT24
21.0000 mg | MEDICATED_PATCH | Freq: Every day | TRANSDERMAL | Status: DC
  Administered 2019-04-05: 21 mg via TRANSDERMAL
  Filled 2019-04-05: qty 1

## 2019-04-05 NOTE — ED Notes (Signed)
Pt made phone call from nurses' desk. Pt voiced understanding of 2-phone call limit of 5 minutes each.

## 2019-04-05 NOTE — ED Notes (Signed)
TTS assessment completed. Recommendation for IP made, pt will need to wait for placement.

## 2019-04-05 NOTE — ED Notes (Signed)
Pt asking for med for depression. Advised pt anti-depressants are not usually prescribed in the ED and that Myrle Sheng, [facility] NP, is aware.

## 2019-04-05 NOTE — ED Notes (Signed)
A Snack and Drink was placed on patient bedside table. A Regular Diet was ordered for Lunch.

## 2019-04-05 NOTE — ED Notes (Signed)
Patient given breakfast tray.

## 2019-04-05 NOTE — ED Notes (Addendum)
Pt requesting "something to help him w/smoking". Nicotine Patch ordered. To be applied after pt showers.

## 2019-04-05 NOTE — ED Notes (Signed)
Pt arrived to Rm 53 - ambulatory - wearing hospital gown. Pt noted to be calm, cooperative, pleasant. Pt asking for 2nd breakfast tray d/t the food on the first one is cold. Delivered at this time. Pt asking for HIV med - advised will be administered when received from pharmacy. Pt voiced understanding.
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
## 2019-04-05 NOTE — ED Notes (Signed)
Pt in shower.  

## 2019-04-05 NOTE — Progress Notes (Signed)
Lindon Romp, NP recommends inpt tx. TTS to seek placement due to no appropriate beds at [facility] per [facility].   CSW faxed referrals to [place]   Disposition will continue to seek bed placement.   Netta Neat, MSW, LCSW Clinical Social Work

## 2019-04-05 NOTE — ED Notes (Addendum)
Pt ate 100% of dinner. Pt now vomiting. Dr Stark Jock aware - New order received for Zofran.

## 2019-04-05 NOTE — ED Notes (Signed)
Breakfast ordered 

## 2019-04-05 NOTE — ED Notes (Signed)
Sitter arrived to bedside.  

## 2019-04-06 ENCOUNTER — Encounter: Payer: Self-pay | Admitting: Internal Medicine

## 2019-04-06 ENCOUNTER — Other Ambulatory Visit: Payer: Self-pay | Admitting: *Deleted

## 2019-04-06 ENCOUNTER — Other Ambulatory Visit: Payer: Self-pay

## 2019-04-06 ENCOUNTER — Ambulatory Visit: Payer: Self-pay

## 2019-04-06 DIAGNOSIS — Z113 Encounter for screening for infections with a predominantly sexual mode of transmission: Secondary | ICD-10-CM

## 2019-04-06 DIAGNOSIS — B20 Human immunodeficiency virus [HIV] disease: Secondary | ICD-10-CM

## 2019-04-06 DIAGNOSIS — F14151 Cocaine abuse with cocaine-induced psychotic disorder with hallucinations: Secondary | ICD-10-CM

## 2019-04-06 DIAGNOSIS — F14159 Cocaine abuse with cocaine-induced psychotic disorder, unspecified: Secondary | ICD-10-CM | POA: Diagnosis present

## 2019-04-06 NOTE — ED Notes (Signed)
Pt's breakfast arrived 

## 2019-04-06 NOTE — ED Notes (Signed)
Pt escorted to lobby by security

## 2019-04-06 NOTE — Discharge Instructions (Signed)
Living With Depression Everyone experiences occasional disappointment, sadness, and loss in their lives. When you are feeling down, blue, or sad for at least 2 weeks in a row, it may mean that you have depression. Depression can affect your thoughts and feelings, relationships, daily activities, and physical health. It is caused by changes in the way your brain functions. If you receive a diagnosis of depression, your health care provider will tell you which type of depression you have and what treatment options are available to you. If you are living with depression, there are ways to help you recover from it and also ways to prevent it from coming back. How to cope with lifestyle changes Coping with stress     Stress is your body's reaction to life changes and events, both good and bad. Stressful situations may include:  Getting married.  The death of a spouse.  Losing a job.  Retiring.  Having a baby. Stress can last just a few hours or it can be ongoing. Stress can play a major role in depression, so it is important to learn both how to cope with stress and how to think about it differently. Talk with your health care provider or a counselor if you would like to learn more about stress reduction. He or she may suggest some stress reduction techniques, such as:  Music therapy. This can include creating music or listening to music. Choose music that you enjoy and that inspires you.  Mindfulness-based meditation. This kind of meditation can be done while sitting or walking. It involves being aware of your normal breaths, rather than trying to control your breathing.  Centering prayer. This is a kind of meditation that involves focusing on a spiritual word or phrase. Choose a word, phrase, or sacred image that is meaningful to you and that brings you peace.  Deep breathing. To do this, expand your stomach and inhale slowly through your nose. Hold your breath for 3-5 seconds, then exhale  slowly, allowing your stomach muscles to relax.  Muscle relaxation. This involves intentionally tensing muscles then relaxing them. Choose a stress reduction technique that fits your lifestyle and personality. Stress reduction techniques take time and practice to develop. Set aside 5-15 minutes a day to do them. Therapists can offer training in these techniques. The training may be covered by some insurance plans. Other things you can do to manage stress include:  Keeping a stress diary. This can help you learn what triggers your stress and ways to control your response.  Understanding what your limits are and saying no to requests or events that lead to a schedule that is too full.  Thinking about how you respond to certain situations. You may not be able to control everything, but you can control how you react.  Adding humor to your life by watching funny films or TV shows.  Making time for activities that help you relax and not feeling guilty about spending your time this way.  Medicines Your health care provider may suggest certain medicines if he or she feels that they will help improve your condition. Avoid using alcohol and other substances that may prevent your medicines from working properly (may interact). It is also important to:  Talk with your pharmacist or health care provider about all the medicines that you take, their possible side effects, and what medicines are safe to take together.  Make it your goal to take part in all treatment decisions (shared decision-making). This includes giving input on   the side effects of medicines. It is best if shared decision-making with your health care provider is part of your total treatment plan. If your health care provider prescribes a medicine, you may not notice the full benefits of it for 4-8 weeks. Most people who are treated for depression need to be on medicine for at least 6-12 months after they feel better. If you are taking  medicines as part of your treatment, do not stop taking medicines without first talking to your health care provider. You may need to have the medicine slowly decreased (tapered) over time to decrease the risk of harmful side effects. Relationships Your health care provider may suggest family therapy along with individual therapy and drug therapy. While there may not be family problems that are causing you to feel depressed, it is still important to make sure your family learns as much as they can about your mental health. Having your family's support can help make your treatment successful. How to recognize changes in your condition Everyone has a different response to treatment for depression. Recovery from major depression happens when you have not had signs of major depression for two months. This may mean that you will start to:  Have more interest in doing activities.  Feel less hopeless than you did 2 months ago.  Have more energy.  Overeat less often, or have better or improving appetite.  Have better concentration. Your health care provider will work with you to decide the next steps in your recovery. It is also important to recognize when your condition is getting worse. Watch for these signs:  Having fatigue or low energy.  Eating too much or too little.  Sleeping too much or too little.  Feeling restless, agitated, or hopeless.  Having trouble concentrating or making decisions.  Having unexplained physical complaints.  Feeling irritable, angry, or aggressive. Get help as soon as you or your family members notice these symptoms coming back. How to get support and help from others How to talk with friends and family members about your condition  Talking to friends and family members about your condition can provide you with one way to get support and guidance. Reach out to trusted friends or family members, explain your symptoms to them, and let them know that you are  working with a health care provider to treat your depression. Financial resources Not all insurance plans cover mental health care, so it is important to check with your insurance carrier. If paying for co-pays or counseling services is a problem, search for a local or county mental health care center. They may be able to offer public mental health care services at low or no cost when you are not able to see a private health care provider. If you are taking medicine for depression, you may be able to get the generic form, which may be less expensive. Some makers of prescription medicines also offer help to patients who cannot afford the medicines they need. Follow these instructions at home:   Get the right amount and quality of sleep.  Cut down on using caffeine, tobacco, alcohol, and other potentially harmful substances.  Try to exercise, such as walking or lifting small weights.  Take over-the-counter and prescription medicines only as told by your health care provider.  Eat a healthy diet that includes plenty of vegetables, fruits, whole grains, low-fat dairy products, and lean protein. Do not eat a lot of foods that are high in solid fats, added sugars, or salt.    Keep all follow-up visits as told by your health care provider. This is important. Contact a health care provider if:  You stop taking your antidepressant medicines, and you have any of these symptoms: ? Nausea. ? Headache. ? Feeling lightheaded. ? Chills and body aches. ? Not being able to sleep (insomnia).  You or your friends and family think your depression is getting worse. Get help right away if:  You have thoughts of hurting yourself or others. If you ever feel like you may hurt yourself or others, or have thoughts about taking your own life, get help right away. You can go to your nearest emergency department or call:  Your local emergency services (911 in the [place]  A suicide crisis helpline, such as the  National Suicide Prevention Lifeline at 1-800-273-8255. This is open 24-hours a day. Summary  If you are living with depression, there are ways to help you recover from it and also ways to prevent it from coming back.  Work with your health care team to create a management plan that includes counseling, stress management techniques, and healthy lifestyle habits. This information is not intended to replace advice given to you by your health care provider. Make sure you discuss any questions you have with your health care provider. Document Released: 05/14/2016 Document Revised: 10/03/2018 Document Reviewed: 05/14/2016 Elsevier Patient Education  2020 [employer]  

## 2019-04-06 NOTE — Progress Notes (Signed)
Pt has been psychiatrically cleared. Substance use and housing resources faxed to [facility] ED for pt.   Kerstie Agent, LCSW, LCAS Disposition CSW [facility] [phone]  

## 2019-04-06 NOTE — ED Notes (Signed)
Patient verbalizes understanding of discharge instructions . Opportunity for questions and answers were provided . Armband removed by staff ,Pt discharged from ED. W/C  offered at D/C  and Declined W/C at D/C and was escorted to lobby by RN.  

## 2019-04-06 NOTE — Consult Note (Addendum)
[facility] Psych ED Discharge  04/06/2019 9:59 AM Fred Wilson  MRN:  [id number] Principal Problem: Cocaine abuse with cocaine-induced psychotic disorder [facility]) Discharge Diagnoses: Principal Problem:   Cocaine abuse with cocaine-induced psychotic disorder ([place])  Subjective: "I need to be inpatient," despite denying suicidal/homicidal ideations, hallucinations, and withdrawal symptoms.  Patient seen and evaluated in person by this provider.  He presented to the emergency department initially for flank pain and then said he had suicidal ideations.  He was also under the influence of cocaine.  Medications were started and patient stabilized.  Outpatient substance abuse resources provided and discharge extra instructions and also discussed with patient.  His biggest concern was needing a place to stay but not interested in homeless shelter resources.  Psychiatrically stable for discharge.  Total Time spent with patient: 30 minutes  Past Psychiatric History: polysubstance abuse  Past Medical History:  Past Medical History:  Diagnosis Date  . ADHD (attention deficit hyperactivity disorder)   . Arthritis   . Asthma   . Bipolar 1 disorder ([place])   . Bronchitis   . Drug-seeking behavior   . GERD (gastroesophageal reflux disease)   . HIV (human immunodeficiency virus infection) ([place])   . Schizophrenia ([place])   . Seasonal allergies   . Substance abuse ([place])   . Suicide attempt [facility])     Past Surgical History:  Procedure Laterality Date  . FRACTURE SURGERY    . KNEE SURGERY     Family History: No family history on file. Family Psychiatric  History: none Social History:  Social History   Substance and Sexual Activity  Alcohol Use No     Social History   Substance and Sexual Activity  Drug Use No  . Types: Marijuana, Cocaine    Social History   Socioeconomic History  . Marital status: Single    Spouse name: Not on file  . Number of children: 4  . Years of education: 45  . Highest  education level: Not on file  Occupational History  . Not on file  Social Needs  . Financial resource strain: Not on file  . Food insecurity    Worry: Not on file    Inability: Not on file  . Transportation needs    Medical: Not on file    Non-medical: Not on file  Tobacco Use  . Smoking status: Former Smoker    Packs/day: 1.00    Years: 5.00    Pack years: 5.00    Types: Cigarettes  . Smokeless tobacco: Never Used  Substance and Sexual Activity  . Alcohol use: No  . Drug use: No    Types: Marijuana, Cocaine  . Sexual activity: Yes    Birth control/protection: None  Lifestyle  . Physical activity    Days per week: Not on file    Minutes per session: Not on file  . Stress: Not on file  Relationships  . Social Herbalist on phone: Not on file    Gets together: Not on file    Attends religious service: Not on file    Active member of club or organization: Not on file    Attends meetings of clubs or organizations: Not on file    Relationship status: Not on file  Other Topics Concern  . Not on file  Social History Narrative   ** Merged History Encounter **        Has this patient used any form of tobacco in the last 30 days? (  Cigarettes, Smokeless Tobacco, Cigars, and/or Pipes) A prescription for an FDA-approved tobacco cessation medication was offered at discharge and the patient refused  Current Medications: Current Facility-Administered Medications  Medication Dose Route Frequency Provider Last Rate Last Dose  . bictegravir-emtricitabine-tenofovir AF (BIKTARVY) 50-200-25 MG per tablet 1 tablet  1 tablet Oral Daily Garlon HatchetSanders, Lisa M, PA-C   1 tablet at 04/05/19 1115  . nicotine (NICODERM CQ - dosed in mg/24 hours) patch 21 mg  21 mg Transdermal Daily Geoffery Lyonselo, Douglas, MD   21 mg at 04/05/19 1315   Current Outpatient Medications  Medication Sig Dispense Refill  . acetaminophen (TYLENOL) 325 MG tablet Take 1,300 mg by mouth every 6 (six) hours as needed for mild  pain or headache.    . doxycycline (VIBRA-TABS) 100 MG tablet Take 1 tablet (100 mg total) by mouth 2 (two) times daily. (Patient not taking: Reported on 03/31/2019) 14 tablet 0  . emtricitabine-tenofovir (TRUVADA) 200-300 MG tablet Take 1 tablet by mouth daily.    Marland Kitchen. HYDROcodone-acetaminophen (NORCO/VICODIN) 5-325 MG tablet Take 2 tablets by mouth every 6 (six) hours as needed. (Patient not taking: Reported on 03/31/2019) 8 tablet 0  . lidocaine (LIDODERM) 5 % Place 1 patch onto the skin daily. Remove & Discard patch within 12 hours or as directed by MD (Patient not taking: Reported on 04/05/2019) 30 patch 0  . methocarbamol (ROBAXIN) 500 MG tablet Take 1 tablet (500 mg total) by mouth 2 (two) times daily. (Patient not taking: Reported on 04/05/2019) 20 tablet 0  . oxyCODONE-acetaminophen (PERCOCET) 5-325 MG tablet Take 2 tablets by mouth every 4 (four) hours as needed. (Patient not taking: Reported on 04/05/2019) 10 tablet 0   PTA Medications: (Not in a hospital admission)  Musculoskeletal: Strength & Muscle Tone: within normal limits Gait & Station: normal Patient leans: N/A  Psychiatric Specialty Exam: Physical Exam  Nursing note and vitals reviewed. Constitutional: He is oriented to person, place, and time. He appears well-developed and well-nourished.  HENT:  Head: Normocephalic.  Neck: Normal range of motion.  Respiratory: Effort normal.  Genitourinary:    Penis normal.   Musculoskeletal: Normal range of motion.  Neurological: He is alert and oriented to person, place, and time.  Psychiatric: His speech is normal and behavior is normal. Judgment and thought content normal. His mood appears anxious. His affect is blunt. Cognition and memory are normal.    Review of Systems  Psychiatric/Behavioral: Positive for substance abuse. The patient is nervous/anxious.   All other systems reviewed and are negative.   Blood pressure 119/82, pulse 94, temperature 98 F (36.7 C), temperature  source Oral, resp. rate 18, SpO2 100 %.There is no height or weight on file to calculate BMI.  General Appearance: Casual  Eye Contact:  Good  Speech:  Normal Rate  Volume:  Normal  Mood:  Irritable  Affect:  Congruent  Thought Process:  Coherent  Orientation:  Full (Time, Place, and Person)  Thought Content:  WDL and Logical  Suicidal Thoughts:  No  Homicidal Thoughts:  No  Memory:  Immediate;   Good Recent;   Good Remote;   Good  Judgement:  Good  Insight:  Fair  Psychomotor Activity:  Normal  Concentration:  Concentration: Good and Attention Span: Good  Recall:  Good  Fund of Knowledge:  Good  Language:  Good  Akathisia:  No  Handed:  Right  AIMS (if indicated):     Assets:  Leisure Time Physical Health Resilience Social Support  ADL's:  Intact  Cognition:  WNL  Sleep:        Demographic Factors:  Male  Loss Factors: NA  Historical Factors: NA  Risk Reduction Factors:   Sense of responsibility to family and Positive social support  Continued Clinical Symptoms:  None  Cognitive Features That Contribute To Risk:  None    Suicide Risk:  Minimal: No identifiable suicidal ideation.  Patients presenting with no risk factors but with morbid ruminations; may be classified as minimal risk based on the severity of the depressive symptoms  Plan Of Care/Follow-up recommendations:  Cocaine induced mood disorder: -Gabapentin 300 mg 3 times daily for anxiety and any withdrawals -Follow-up with [facility]  Disposition: discharge home Nanine Means, NP 04/06/2019, 9:59 AM

## 2019-04-06 NOTE — ED Notes (Signed)
Inpt

## 2019-04-06 NOTE — ED Notes (Signed)
Breakfast ordered 

## 2019-04-10 ENCOUNTER — Encounter (HOSPITAL_COMMUNITY): Payer: Self-pay | Admitting: Emergency Medicine

## 2019-04-10 ENCOUNTER — Emergency Department (HOSPITAL_COMMUNITY)
Admission: EM | Admit: 2019-04-10 | Discharge: 2019-04-10 | Disposition: A | Discharge status: Home / Self Care | Payer: Self-pay | Attending: Emergency Medicine | Admitting: Emergency Medicine

## 2019-04-10 ENCOUNTER — Emergency Department (HOSPITAL_COMMUNITY): Payer: Self-pay

## 2019-04-10 ENCOUNTER — Other Ambulatory Visit: Payer: Self-pay

## 2019-04-10 DIAGNOSIS — Y939 Activity, unspecified: Secondary | ICD-10-CM | POA: Insufficient documentation

## 2019-04-10 DIAGNOSIS — Y929 Unspecified place or not applicable: Secondary | ICD-10-CM | POA: Insufficient documentation

## 2019-04-10 DIAGNOSIS — W19XXXA Unspecified fall, initial encounter: Secondary | ICD-10-CM | POA: Insufficient documentation

## 2019-04-10 DIAGNOSIS — B20 Human immunodeficiency virus [HIV] disease: Secondary | ICD-10-CM | POA: Insufficient documentation

## 2019-04-10 DIAGNOSIS — F1210 Cannabis abuse, uncomplicated: Secondary | ICD-10-CM | POA: Insufficient documentation

## 2019-04-10 DIAGNOSIS — Y999 Unspecified external cause status: Secondary | ICD-10-CM | POA: Insufficient documentation

## 2019-04-10 DIAGNOSIS — R42 Dizziness and giddiness: Secondary | ICD-10-CM | POA: Insufficient documentation

## 2019-04-10 DIAGNOSIS — S46911A Strain of unspecified muscle, fascia and tendon at shoulder and upper arm level, right arm, initial encounter: Secondary | ICD-10-CM | POA: Insufficient documentation

## 2019-04-10 DIAGNOSIS — J45909 Unspecified asthma, uncomplicated: Secondary | ICD-10-CM | POA: Insufficient documentation

## 2019-04-10 DIAGNOSIS — Z87891 Personal history of nicotine dependence: Secondary | ICD-10-CM | POA: Insufficient documentation

## 2019-04-10 DIAGNOSIS — F1410 Cocaine abuse, uncomplicated: Secondary | ICD-10-CM | POA: Insufficient documentation

## 2019-04-10 LAB — VALPROIC ACID LEVEL: Valproic Acid Lvl: 21 ug/mL — ABNORMAL LOW (ref 50.0–100.0)

## 2019-04-10 LAB — LIPID PANEL
Cholesterol: 138 mg/dL (ref 0–200)
HDL: 49 mg/dL (ref >40)
LDL Cholesterol: 52 mg/dL (ref 0–99)
Total CHOL/HDL Ratio: 2.8 RATIO
Triglycerides: 183 mg/dL — ABNORMAL HIGH (ref <150)
VLDL: 37 mg/dL (ref 0–40)

## 2019-04-10 LAB — COMPREHENSIVE METABOLIC PANEL
ALT: 16 U/L (ref 0–44)
AST: 19 U/L (ref 15–41)
Albumin: 4 g/dL (ref 3.5–5.0)
Alkaline Phosphatase: 65 U/L (ref 38–126)
Anion gap: 9 (ref 5–15)
BUN: 13 mg/dL (ref 6–20)
CO2: 27 mmol/L (ref 22–32)
Calcium: 9.6 mg/dL (ref 8.9–10.3)
Chloride: 102 mmol/L (ref 98–111)
Creatinine, Ser: 1.16 mg/dL (ref 0.61–1.24)
GFR calc Af Amer: >60 mL/min (ref >60)
GFR calc non Af Amer: >60 mL/min (ref >60)
Glucose, Bld: 118 mg/dL — ABNORMAL HIGH (ref 70–99)
Potassium: 3.6 mmol/L (ref 3.5–5.1)
Sodium: 138 mmol/L (ref 135–145)
Total Bilirubin: 0.6 mg/dL (ref 0.3–1.2)
Total Protein: 7.4 g/dL (ref 6.5–8.1)

## 2019-04-10 LAB — CBC
HCT: 42.5 % (ref 39.0–52.0)
Hemoglobin: 14.2 g/dL (ref 13.0–17.0)
MCH: 31 pg (ref 26.0–34.0)
MCHC: 33.4 g/dL (ref 30.0–36.0)
MCV: 92.8 fL (ref 80.0–100.0)
Platelets: 285 10*3/uL (ref 150–400)
RBC: 4.58 MIL/uL (ref 4.22–5.81)
RDW: 13.6 % (ref 11.5–15.5)
WBC: 8.5 10*3/uL (ref 4.0–10.5)
nRBC: 0 % (ref 0.0–0.2)

## 2019-04-10 LAB — RAPID URINE DRUG SCREEN, HOSP PERFORMED
Amphetamines: NOT DETECTED
Barbiturates: NOT DETECTED
Benzodiazepines: NOT DETECTED
Cocaine: NOT DETECTED
Opiates: NOT DETECTED
Tetrahydrocannabinol: POSITIVE — AB

## 2019-04-10 LAB — URINALYSIS, ROUTINE W REFLEX MICROSCOPIC
Bilirubin Urine: NEGATIVE
Glucose, UA: NEGATIVE mg/dL
Hgb urine dipstick: NEGATIVE
Ketones, ur: NEGATIVE mg/dL
Leukocytes,Ua: NEGATIVE
Nitrite: NEGATIVE
Protein, ur: NEGATIVE mg/dL
Specific Gravity, Urine: 1.013 (ref 1.005–1.030)
pH: 6 (ref 5.0–8.0)

## 2019-04-10 LAB — TROPONIN I (HIGH SENSITIVITY)
Troponin I (High Sensitivity): 6 ng/L (ref <18)
Troponin I (High Sensitivity): 9 ng/L (ref <18)

## 2019-04-10 LAB — PROTIME-INR
INR: 1.1 (ref 0.8–1.2)
Prothrombin Time: 13.6 seconds (ref 11.4–15.2)

## 2019-04-10 LAB — APTT: aPTT: 36 seconds (ref 24–36)

## 2019-04-10 LAB — CBG MONITORING, ED: Glucose-Capillary: 97 mg/dL (ref 70–99)

## 2019-04-10 MED ORDER — SODIUM CHLORIDE 0.9% FLUSH: 3.0000 mL | Freq: Once | INTRAVENOUS | Status: DC

## 2019-04-10 MED ORDER — ASPIRIN 81 MG PO CHEW
324.0000 mg | CHEWABLE_TABLET | Freq: Once | ORAL | Status: DC
  Filled 2019-04-10: qty 4

## 2019-04-10 MED ORDER — SODIUM CHLORIDE 0.9 % IV BOLUS
1000.0000 mL | Freq: Once | INTRAVENOUS | Status: AC
  Administered 2019-04-10: 1000 mL via INTRAVENOUS

## 2019-04-10 MED ORDER — HEPARIN SODIUM (PORCINE) 5000 UNIT/ML IJ SOLN
60.0000 [IU]/kg | Freq: Once | INTRAMUSCULAR | Status: DC
  Filled 2019-04-10: qty 1

## 2019-04-10 MED ORDER — ACETAMINOPHEN 500 MG PO TABS
1000.0000 mg | ORAL_TABLET | Freq: Once | ORAL | Status: AC
  Administered 2019-04-10: 1000 mg via ORAL
  Filled 2019-04-10: qty 2

## 2019-04-10 NOTE — ED Notes (Signed)
Per registration staff at check-in pt did fall all the way to the ground.  Otila Kluver, Agricultural consultant notified.
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
## 2019-04-10 NOTE — Discharge Instructions (Addendum)
You may use over-the-counter Acetaminophen (Tylenol), topical muscle creams such as SalonPas, Icy Hot, Bengay, etc. Please stretch, apply heat, and have massage therapy for additional assistance.  

## 2019-04-10 NOTE — ED Triage Notes (Addendum)
C/o R shoulder pain x 3-4 days with no known injury.  Per EMT, while pt checking in at registration he became dizzy and started falling over to L side against wall at registration.  Pt states he hit L side of head on wall and c/o "a little bit of pain" to L side of head.  No LOC.  Pt did not fall to ground.  Denies chest pain/abd pain.  Denies drug use.  Pt mildly diaphoretic on arrival.

## 2019-04-10 NOTE — ED Notes (Signed)
Pt straight to treatment room due to abnormal EKG.  Pt now reports he smoked a blunt earlier today.

## 2019-04-10 NOTE — ED Notes (Signed)
This Tech attempted orthostatic vitals on pt. Pt tried to sit on side of bed and c/o dizziness and stated he could not sit or stand.

## 2019-04-13 ENCOUNTER — Other Ambulatory Visit: Payer: Medicaid Other

## 2019-04-15 ENCOUNTER — Emergency Department (HOSPITAL_COMMUNITY)
Admission: EM | Admit: 2019-04-15 | Discharge: 2019-04-16 | Disposition: A | Discharge status: Other Acute Inpatient Hospital | Payer: Self-pay | Attending: Emergency Medicine | Admitting: Emergency Medicine

## 2019-04-15 ENCOUNTER — Emergency Department (HOSPITAL_COMMUNITY): Payer: Self-pay

## 2019-04-15 ENCOUNTER — Other Ambulatory Visit: Payer: Self-pay

## 2019-04-15 ENCOUNTER — Encounter (HOSPITAL_COMMUNITY): Payer: Self-pay | Admitting: Emergency Medicine

## 2019-04-15 DIAGNOSIS — R52 Pain, unspecified: Secondary | ICD-10-CM

## 2019-04-15 DIAGNOSIS — R443 Hallucinations, unspecified: Secondary | ICD-10-CM

## 2019-04-15 DIAGNOSIS — Z91030 Bee allergy status: Secondary | ICD-10-CM | POA: Insufficient documentation

## 2019-04-15 DIAGNOSIS — Z886 Allergy status to analgesic agent status: Secondary | ICD-10-CM | POA: Insufficient documentation

## 2019-04-15 DIAGNOSIS — Z20828 Contact with and (suspected) exposure to other viral communicable diseases: Secondary | ICD-10-CM | POA: Insufficient documentation

## 2019-04-15 DIAGNOSIS — J45909 Unspecified asthma, uncomplicated: Secondary | ICD-10-CM | POA: Insufficient documentation

## 2019-04-15 DIAGNOSIS — M79671 Pain in right foot: Secondary | ICD-10-CM | POA: Insufficient documentation

## 2019-04-15 DIAGNOSIS — Z87891 Personal history of nicotine dependence: Secondary | ICD-10-CM | POA: Insufficient documentation

## 2019-04-15 DIAGNOSIS — B20 Human immunodeficiency virus [HIV] disease: Secondary | ICD-10-CM | POA: Insufficient documentation

## 2019-04-15 DIAGNOSIS — Z885 Allergy status to narcotic agent status: Secondary | ICD-10-CM | POA: Insufficient documentation

## 2019-04-15 DIAGNOSIS — M25571 Pain in right ankle and joints of right foot: Secondary | ICD-10-CM

## 2019-04-15 DIAGNOSIS — Z888 Allergy status to other drugs, medicaments and biological substances status: Secondary | ICD-10-CM | POA: Insufficient documentation

## 2019-04-15 DIAGNOSIS — Z79899 Other long term (current) drug therapy: Secondary | ICD-10-CM | POA: Insufficient documentation

## 2019-04-15 DIAGNOSIS — R440 Auditory hallucinations: Secondary | ICD-10-CM | POA: Insufficient documentation

## 2019-04-15 DIAGNOSIS — F209 Schizophrenia, unspecified: Secondary | ICD-10-CM | POA: Insufficient documentation

## 2019-04-15 NOTE — ED Triage Notes (Addendum)
Patient reports right ankle pain onset last Thursday while moving furniture, ambulatory /no swelling. Patient added intermittent A/V hallucinations .

## 2019-04-16 ENCOUNTER — Other Ambulatory Visit: Payer: Self-pay | Admitting: Behavioral Health

## 2019-04-16 ENCOUNTER — Encounter (HOSPITAL_COMMUNITY): Payer: Self-pay | Admitting: *Deleted

## 2019-04-16 ENCOUNTER — Emergency Department (HOSPITAL_COMMUNITY): Payer: Self-pay

## 2019-04-16 ENCOUNTER — Inpatient Hospital Stay (HOSPITAL_COMMUNITY)
Admission: AD | Admit: 2019-04-16 | Discharge: 2019-04-19 | DRG: 885 | Disposition: A | Discharge status: Home / Self Care | Payer: No Typology Code available for payment source | Source: Intra-hospital | Attending: Emergency Medicine | Admitting: Emergency Medicine

## 2019-04-16 DIAGNOSIS — W19XXXA Unspecified fall, initial encounter: Secondary | ICD-10-CM

## 2019-04-16 DIAGNOSIS — Z79899 Other long term (current) drug therapy: Secondary | ICD-10-CM

## 2019-04-16 DIAGNOSIS — J45909 Unspecified asthma, uncomplicated: Secondary | ICD-10-CM | POA: Diagnosis present

## 2019-04-16 DIAGNOSIS — G4700 Insomnia, unspecified: Secondary | ICD-10-CM | POA: Diagnosis present

## 2019-04-16 DIAGNOSIS — F329 Major depressive disorder, single episode, unspecified: Secondary | ICD-10-CM | POA: Diagnosis present

## 2019-04-16 DIAGNOSIS — F17210 Nicotine dependence, cigarettes, uncomplicated: Secondary | ICD-10-CM | POA: Diagnosis present

## 2019-04-16 DIAGNOSIS — Z818 Family history of other mental and behavioral disorders: Secondary | ICD-10-CM

## 2019-04-16 DIAGNOSIS — B20 Human immunodeficiency virus [HIV] disease: Secondary | ICD-10-CM | POA: Diagnosis present

## 2019-04-16 DIAGNOSIS — F1210 Cannabis abuse, uncomplicated: Secondary | ICD-10-CM | POA: Diagnosis present

## 2019-04-16 DIAGNOSIS — F319 Bipolar disorder, unspecified: Principal | ICD-10-CM | POA: Diagnosis present

## 2019-04-16 DIAGNOSIS — F32A Depression, unspecified: Secondary | ICD-10-CM | POA: Diagnosis present

## 2019-04-16 DIAGNOSIS — R45851 Suicidal ideations: Secondary | ICD-10-CM | POA: Diagnosis present

## 2019-04-16 DIAGNOSIS — F4310 Post-traumatic stress disorder, unspecified: Secondary | ICD-10-CM | POA: Diagnosis present

## 2019-04-16 DIAGNOSIS — Z23 Encounter for immunization: Secondary | ICD-10-CM

## 2019-04-16 DIAGNOSIS — K219 Gastro-esophageal reflux disease without esophagitis: Secondary | ICD-10-CM | POA: Diagnosis present

## 2019-04-16 DIAGNOSIS — Z20828 Contact with and (suspected) exposure to other viral communicable diseases: Secondary | ICD-10-CM | POA: Diagnosis present

## 2019-04-16 DIAGNOSIS — F14159 Cocaine abuse with cocaine-induced psychotic disorder, unspecified: Secondary | ICD-10-CM | POA: Diagnosis present

## 2019-04-16 LAB — BASIC METABOLIC PANEL
Anion gap: 8 (ref 5–15)
BUN: 9 mg/dL (ref 6–20)
CO2: 25 mmol/L (ref 22–32)
Calcium: 9.5 mg/dL (ref 8.9–10.3)
Chloride: 105 mmol/L (ref 98–111)
Creatinine, Ser: 0.82 mg/dL (ref 0.61–1.24)
GFR calc Af Amer: >60 mL/min (ref >60)
GFR calc non Af Amer: >60 mL/min (ref >60)
Glucose, Bld: 96 mg/dL (ref 70–99)
Potassium: 3.2 mmol/L — ABNORMAL LOW (ref 3.5–5.1)
Sodium: 138 mmol/L (ref 135–145)

## 2019-04-16 LAB — RAPID URINE DRUG SCREEN, HOSP PERFORMED
Amphetamines: NOT DETECTED
Barbiturates: NOT DETECTED
Benzodiazepines: NOT DETECTED
Cocaine: NOT DETECTED
Opiates: NOT DETECTED
Tetrahydrocannabinol: POSITIVE — AB

## 2019-04-16 LAB — CBC WITH DIFFERENTIAL/PLATELET
Abs Immature Granulocytes: 0.01 10*3/uL (ref 0.00–0.07)
Basophils Absolute: 0 10*3/uL (ref 0.0–0.1)
Basophils Relative: 0 %
Eosinophils Absolute: 0.3 10*3/uL (ref 0.0–0.5)
Eosinophils Relative: 3 %
HCT: 40.9 % (ref 39.0–52.0)
Hemoglobin: 13.5 g/dL (ref 13.0–17.0)
Immature Granulocytes: 0 %
Lymphocytes Relative: 24 %
Lymphs Abs: 1.8 10*3/uL (ref 0.7–4.0)
MCH: 31.1 pg (ref 26.0–34.0)
MCHC: 33 g/dL (ref 30.0–36.0)
MCV: 94.2 fL (ref 80.0–100.0)
Monocytes Absolute: 0.5 10*3/uL (ref 0.1–1.0)
Monocytes Relative: 7 %
Neutro Abs: 5.1 10*3/uL (ref 1.7–7.7)
Neutrophils Relative %: 66 %
Platelets: 259 10*3/uL (ref 150–400)
RBC: 4.34 MIL/uL (ref 4.22–5.81)
RDW: 13.5 % (ref 11.5–15.5)
WBC: 7.8 10*3/uL (ref 4.0–10.5)
nRBC: 0 % (ref 0.0–0.2)

## 2019-04-16 LAB — ACETAMINOPHEN LEVEL: Acetaminophen (Tylenol), Serum: <10 ug/mL — ABNORMAL LOW (ref 10–30)

## 2019-04-16 LAB — ETHANOL: Alcohol, Ethyl (B): <10 mg/dL (ref <10)

## 2019-04-16 LAB — SALICYLATE LEVEL: Salicylate Lvl: <7 mg/dL (ref 2.8–30.0)

## 2019-04-16 LAB — SARS CORONAVIRUS 2 BY RT PCR (HOSPITAL ORDER, PERFORMED IN ~~LOC~~ HOSPITAL LAB): SARS Coronavirus 2: NEGATIVE

## 2019-04-16 MED ORDER — BICTEGRAVIR-EMTRICITAB-TENOFOV 50-200-25 MG PO TABS
1.0000 | ORAL_TABLET | Freq: Every day | ORAL | Status: DC
  Administered 2019-04-16: 10:00:00 1 via ORAL
  Filled 2019-04-16: qty 1

## 2019-04-16 MED ORDER — TRAZODONE HCL 50 MG PO TABS
50.0000 mg | ORAL_TABLET | Freq: Once | ORAL | Status: AC
  Administered 2019-04-16: 50 mg via ORAL
  Filled 2019-04-16 (×2): qty 1

## 2019-04-16 MED ORDER — ACETAMINOPHEN 325 MG PO TABS
650.0000 mg | ORAL_TABLET | Freq: Once | ORAL | Status: AC
  Administered 2019-04-16: 04:00:00 650 mg via ORAL
  Filled 2019-04-16: qty 2

## 2019-04-16 MED ORDER — POTASSIUM CHLORIDE CRYS ER 20 MEQ PO TBCR
40.0000 meq | EXTENDED_RELEASE_TABLET | Freq: Every day | ORAL | Status: DC
  Administered 2019-04-16: 40 meq via ORAL
  Filled 2019-04-16: qty 2

## 2019-04-16 MED ORDER — ONDANSETRON HCL 4 MG PO TABS
4.0000 mg | ORAL_TABLET | Freq: Once | ORAL | Status: AC
  Administered 2019-04-16: 4 mg via ORAL
  Filled 2019-04-16 (×2): qty 1
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
## 2019-04-16 NOTE — Progress Notes (Signed)
Pt accepted to [facility]; room 301-1 Dr. Dwyane Dee is the accepting provider Dr. Mallie Darting is the attending provider.   Call report to [place] [facility] ED notified.    Pt is voluntary and will be transported by [facility] Pt may arrive at [facility] at [place], [place], [place] Disposition [place] [facility]/TTS [phone]

## 2019-04-16 NOTE — ED Notes (Signed)
TTS at bedside. 

## 2019-04-16 NOTE — ED Notes (Signed)
Patient was ambulatory to room, is wearing paper scrubs, and sitter is at bedside.

## 2019-04-16 NOTE — ED Notes (Signed)
Tourist information centre manager
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
## 2019-04-16 NOTE — ED Notes (Signed)
Ordered lunch 

## 2019-04-16 NOTE — ED Notes (Signed)
Breakfast Ordered 

## 2019-04-16 NOTE — ED Notes (Signed)
Pt. Able to ambulate to the BR. Without any assistance. Will continue to monitor pt.

## 2019-04-16 NOTE — ED Notes (Signed)
Pt. Currently taking a shower.

## 2019-04-16 NOTE — Tx Team (Addendum)
Initial Treatment Plan 04/16/2019 9:29 PM HARUO STEPANEK [id number]    PATIENT STRESSORS: Financial difficulties Loss of child Marital or family conflict Medication change or noncompliance Occupational concerns Substance abuse Traumatic event   PATIENT STRENGTHS: Ability for insight Average or above average intelligence Capable of independent living Communication skills General fund of knowledge Motivation for treatment/growth Physical Health   PATIENT IDENTIFIED PROBLEMS: "to find a place to live"    "to find a job"    "missed interview with [employer] for disability"             DISCHARGE CRITERIA:  Ability to meet basic life and health needs Adequate post-discharge living arrangements Improved stabilization in mood, thinking, and/or behavior Medical problems require only outpatient monitoring Motivation to continue treatment in a less acute level of care Need for constant or close observation no longer present Reduction of life-threatening or endangering symptoms to within safe limits Safe-care adequate arrangements made Verbal commitment to aftercare and medication compliance  PRELIMINARY DISCHARGE PLAN: Attend aftercare/continuing care group Attend PHP/IOP Outpatient therapy Placement in alternative living arrangements Return to previous living arrangement  PATIENT/FAMILY INVOLVEMENT: This treatment plan has been presented to and reviewed with the patient, CALIL AMOR.  The patient has been given the opportunity to ask questions and make suggestions.  Lucile Crater, RN 04/16/2019, 9:29 PM

## 2019-04-16 NOTE — ED Notes (Signed)
Pt. Transferred into Purple zone RM. 51

## 2019-04-16 NOTE — ED Notes (Addendum)
Pt verbalized understanding and agreement w/tx plan - accepted to [facility] - signed consent form. Pt declined to contact family/friend to notify of tx plan.

## 2019-04-16 NOTE — ED Notes (Signed)
Family at bedside. 

## 2019-04-16 NOTE — ED Notes (Signed)
Pt. Took a shower, brushed his teeth and has been cooperative since arriving into purple zone. Pt. Asked about visiting hours and rules, was told by RN that there are no visitors allowed in the Purple zone. Pt. Nodded and verbally said "that's fine". Writer, Leondro Coryell NT, did explain to him that instead he may make phone calls. Pt. Voiced agreement, will continue to monitor pt.

## 2019-04-16 NOTE — ED Notes (Signed)
Contacted AC at [facility], said they would call back regarding bed placement.

## 2019-04-17 DIAGNOSIS — F333 Major depressive disorder, recurrent, severe with psychotic symptoms: Secondary | ICD-10-CM

## 2019-04-17 DIAGNOSIS — F32A Depression, unspecified: Secondary | ICD-10-CM | POA: Diagnosis present

## 2019-04-17 DIAGNOSIS — F329 Major depressive disorder, single episode, unspecified: Secondary | ICD-10-CM | POA: Diagnosis present

## 2019-04-17 MED ORDER — ACETAMINOPHEN 325 MG PO TABS
650.0000 mg | ORAL_TABLET | Freq: Four times a day (QID) | ORAL | Status: DC | PRN
  Administered 2019-04-18 – 2019-04-19 (×2): 650 mg via ORAL
  Filled 2019-04-17 (×2): qty 2

## 2019-04-17 MED ORDER — EMTRICITABINE-TENOFOVIR AF 200-25 MG PO TABS
1.0000 | ORAL_TABLET | Freq: Every day | ORAL | Status: DC
  Administered 2019-04-17 – 2019-04-19 (×3): 1 via ORAL
  Filled 2019-04-17 (×5): qty 1

## 2019-04-17 MED ORDER — HYDROXYZINE HCL 25 MG PO TABS
25.0000 mg | ORAL_TABLET | Freq: Three times a day (TID) | ORAL | Status: DC | PRN
  Administered 2019-04-18 (×2): 25 mg via ORAL
  Filled 2019-04-17 (×2): qty 1

## 2019-04-17 MED ORDER — SERTRALINE HCL 25 MG PO TABS
25.0000 mg | ORAL_TABLET | Freq: Every day | ORAL | Status: DC
  Administered 2019-04-17 – 2019-04-18 (×2): 25 mg via ORAL
  Filled 2019-04-17 (×4): qty 1

## 2019-04-17 MED ORDER — ALUM & MAG HYDROXIDE-SIMETH 200-200-20 MG/5ML PO SUSP: 30.0000 mL | ORAL | Status: DC | PRN

## 2019-04-17 MED ORDER — QUETIAPINE FUMARATE 50 MG PO TABS
50.0000 mg | ORAL_TABLET | Freq: Every day | ORAL | Status: DC
  Administered 2019-04-17: 50 mg via ORAL
  Filled 2019-04-17 (×3): qty 1

## 2019-04-17 MED ORDER — DIVALPROEX SODIUM 250 MG PO DR TAB
750.0000 mg | DELAYED_RELEASE_TABLET | Freq: Every evening | ORAL | Status: DC
  Administered 2019-04-17 – 2019-04-18 (×2): 750 mg via ORAL
  Filled 2019-04-17 (×3): qty 3

## 2019-04-17 MED ORDER — MAGNESIUM HYDROXIDE 400 MG/5ML PO SUSP: 30.0000 mL | Freq: Every day | ORAL | Status: DC | PRN

## 2019-04-17 MED ORDER — INFLUENZA VAC SPLIT QUAD 0.5 ML IM SUSY
0.5000 mL | PREFILLED_SYRINGE | INTRAMUSCULAR | Status: AC
  Administered 2019-04-18: 0.5 mL via INTRAMUSCULAR
  Filled 2019-04-17: qty 0.5
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
## 2019-04-17 NOTE — Progress Notes (Addendum)
CSW spoke with intake clinician, Ms.June at [facility]. She confirms she spoke with this patient earlier in the week and he is on their waitlist. She requested information to be faxed over. She reports there is a waitlist for treatment but encouraged CSW to call back on Monday morning for updates.   Patient was also provided contact information for the [facility].  Stephanie Acre, LCSW-A Clinical Social Worker

## 2019-04-17 NOTE — Care Management (Signed)
CMA sent referral to [facility] will follow up on Monday.   CMA will notify CSW.     Nami Strawder Care Management Assistant  Email:[email or web address] Office: [phone]

## 2019-04-17 NOTE — Plan of Care (Signed)
  Problem: Activity: Goal: Interest or engagement in activities will improve Outcome: Progressing   Problem: Coping: Goal: Ability to demonstrate self-control will improve Outcome: Progressing   Problem: Safety: Goal: Periods of time without injury will increase Outcome: Progressing   

## 2019-04-17 NOTE — Progress Notes (Signed)
CSW attempted to meet with patient on unit to complete PSA. Patient requested to meet with CSW at a later time, so he could speak with another patient that is discharging today.  Patient was provided housing and shelter resource information. He reports he left a detailed voicemail for [facility] and he is waiting for a returned phone call.  Stephanie Acre, LCSW-A Clinical Social Worker
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
## 2019-04-17 NOTE — H&P (Signed)
Psychiatric Admission Assessment Adult  Patient Identification: Fred Wilson MRN:  [id number] Date of Evaluation:  04/17/2019 Chief Complaint:  MMD Principal Diagnosis: <principal problem not specified> Diagnosis:  Active Problems:   MDD (major depressive disorder)   Depression  History of Present Illness: Patient is seen and examined.  Patient is a 33 year old male with a past psychiatric history significant for posttraumatic stress disorder, possible bipolar disorder, cocaine use disorder and marijuana use disorder who presented to the [facility] emergency department on 10/21 secondary to right ankle pain as well as auditory and visual hallucinations.  The patient stated he had come to the emergency room earlier this month, and was discharged and went to [facility] to their crisis center.  He stayed there until approximately 5 days ago.  He followed up there and was given Depakote and Seroquel.  He stated that he had been having auditory and visual hallucinations, and was not sleeping.  Unfortunately within the last 7 days his 33 year old died from coronavirus related issues.  The funeral was 3 days ago.  His other child had also died at a very young age.  This caused him great distress.  He had attempted to put a bag over his head and suffocate himself, and he also wanted to get a gun and kill himself.  He stated this was his first hospitalization in our facility.  He admitted to daily marijuana use, and the last use of cocaine was reportedly 4 weeks ago.  He also has a diagnosis of HIV, and stated he had been compliant with his HIV medications.  He admitted to helplessness, hopelessness and worthlessness as well as the auditory and visual hallucinations.  He was admitted to the hospital for evaluation and stabilization.  Associated Signs/Symptoms: Depression Symptoms:  depressed mood, anhedonia, insomnia, psychomotor agitation, fatigue, feelings of worthlessness/guilt, difficulty  concentrating, hopelessness, suicidal thoughts with specific plan, anxiety, loss of energy/fatigue, disturbed sleep, (Hypo) Manic Symptoms:  Impulsivity, Irritable Mood, Labiality of Mood, Anxiety Symptoms:  Excessive Worry, Psychotic Symptoms:  Hallucinations: Auditory PTSD Symptoms: Had a traumatic exposure:  In the past Total Time spent with patient: 45 minutes  Past Psychiatric History: Patient stated this was his first inpatient hospitalization.  He had spent approximately a week at [facility] at their residential program.  He has been in the emergency room for substance issues previously.  Is the patient at risk to self? Yes.    Has the patient been a risk to self in the past 6 months? Yes.    Has the patient been a risk to self within the distant past? No.  Is the patient a risk to others? No.  Has the patient been a risk to others in the past 6 months? No.  Has the patient been a risk to others within the distant past? No.   Prior Inpatient Therapy:   Prior Outpatient Therapy:    Alcohol Screening: 1. How often do you have a drink containing alcohol?: Never 2. How many drinks containing alcohol do you have on a typical day when you are drinking?: 1 or 2 3. How often do you have six or more drinks on one occasion?: Never AUDIT-C Score: 0 4. How often during the last year have you found that you were not able to stop drinking once you had started?: Never 5. How often during the last year have you failed to do what was normally expected from you becasue of drinking?: Never 6. How often during the last year have you  needed a first drink in the morning to get yourself going after a heavy drinking session?: Never 7. How often during the last year have you had a feeling of guilt of remorse after drinking?: Never 8. How often during the last year have you been unable to remember what happened the night before because you had been drinking?: Never 9. Have you or someone else been  injured as a result of your drinking?: No 10. Has a relative or friend or a doctor or another health worker been concerned about your drinking or suggested you cut down?: No Alcohol Use Disorder Identification Test Final Score (AUDIT): 0 Alcohol Brief Interventions/Follow-up: AUDIT Score <7 follow-up not indicated Substance Abuse History in the last 12 months:  Yes.   Consequences of Substance Abuse: Negative Previous Psychotropic Medications: Yes  Psychological Evaluations: Yes  Past Medical History:  Past Medical History:  Diagnosis Date  . ADHD (attention deficit hyperactivity disorder)   . Arthritis   . Asthma   . Bipolar 1 disorder (HCC)   . Bronchitis   . Drug-seeking behavior   . GERD (gastroesophageal reflux disease)   . HIV (human immunodeficiency virus infection) (HCC)   . Schizophrenia (HCC)   . Seasonal allergies   . Substance abuse (HCC)   . Suicide attempt [facility])     Past Surgical History:  Procedure Laterality Date  . FRACTURE SURGERY    . KNEE SURGERY     Family History: History reviewed. No pertinent family history. Family Psychiatric  History: He stated he had family members with bipolar disorder as well as schizophrenia. Tobacco Screening: Have you used any form of tobacco in the last 30 days? (Cigarettes, Smokeless Tobacco, Cigars, and/or Pipes): Yes Tobacco use, Select all that apply: 5 or more cigarettes per day Are you interested in Tobacco Cessation Medications?: Yes, will notify MD for an order Counseled patient on smoking cessation including recognizing danger situations, developing coping skills and basic information about quitting provided: Refused/Declined practical counseling Social History:  Social History   Substance and Sexual Activity  Alcohol Use No     Social History   Substance and Sexual Activity  Drug Use Yes  . Types: Marijuana, Cocaine    Additional Social History:                           Allergies:   Allergies   Allergen Reactions  . Bee Venom Anaphylaxis  . Dilaudid [Hydromorphone Hcl] Anaphylaxis, Hives and Itching  . Morphine And Related Hives, Shortness Of Breath and Swelling  . Olanzapine Hives  . Tramadol Hives, Shortness Of Breath and Swelling  . Aspirin Hives and Swelling    Hives and swollen glands  . Ibuprofen Hives and Swelling    Hives and swollen glands  . Morphine And Related Hives and Swelling    Hives and swollen glands  . Toradol [Ketorolac Tromethamine] Hives and Swelling    Glands swell  . Tramadol Hives and Swelling    Hives and swollen glands   Lab Results:  Results for orders placed or performed during the hospital encounter of 04/15/19 (from the past 48 hour(s))  SARS Coronavirus 2 by RT PCR (hospital order, performed in [facility] hospital lab) Nasopharyngeal Nasopharyngeal Swab     Status: None   Collection Time: 04/16/19  5:20 AM   Specimen: Nasopharyngeal Swab  Result Value Ref Range   SARS Coronavirus 2 NEGATIVE NEGATIVE    Comment: (NOTE) If result  is NEGATIVE SARS-CoV-2 target nucleic acids are NOT DETECTED. The SARS-CoV-2 RNA is generally detectable in upper and lower  respiratory specimens during the acute phase of infection. The lowest  concentration of SARS-CoV-2 viral copies this assay can detect is 250  copies / mL. A negative result does not preclude SARS-CoV-2 infection  and should not be used as the sole basis for treatment or other  patient management decisions.  A negative result may occur with  improper specimen collection / handling, submission of specimen other  than nasopharyngeal swab, presence of viral mutation(s) within the  areas targeted by this assay, and inadequate number of viral copies  (<250 copies / mL). A negative result must be combined with clinical  observations, patient history, and epidemiological information. If result is POSITIVE SARS-CoV-2 target nucleic acids are DETECTED. The SARS-CoV-2 RNA is generally detectable  in upper and lower  respiratory specimens dur ing the acute phase of infection.  Positive  results are indicative of active infection with SARS-CoV-2.  Clinical  correlation with patient history and other diagnostic information is  necessary to determine patient infection status.  Positive results do  not rule out bacterial infection or co-infection with other viruses. If result is PRESUMPTIVE POSTIVE SARS-CoV-2 nucleic acids MAY BE PRESENT.   A presumptive positive result was obtained on the submitted specimen  and confirmed on repeat testing.  While 2019 novel coronavirus  (SARS-CoV-2) nucleic acids may be present in the submitted sample  additional confirmatory testing may be necessary for epidemiological  and / or clinical management purposes  to differentiate between  SARS-CoV-2 and other Sarbecovirus currently known to infect humans.  If clinically indicated additional testing with an alternate test  methodology [phone]) is advised. The SARS-CoV-2 RNA is generally  detectable in upper and lower respiratory sp ecimens during the acute  phase of infection. The expected result is Negative. Fact Sheet for Patients:  BoilerBrush.com.cy Fact Sheet for Healthcare Providers: https://pope.com/ This test is not yet approved or cleared by the [place] FDA and has been authorized for detection and/or diagnosis of SARS-CoV-2 by FDA under an Emergency Use Authorization (EUA).  This EUA will remain in effect (meaning this test can be used) for the duration of the COVID-19 declaration under Section 564(b)(1) of the Act, 21 U.S.C. section 360bbb-3(b)(1), unless the authorization is terminated or revoked sooner. Performed at [facility], [address] [place], [place] [address]   CBC with Differential     Status: None   Collection Time: 04/16/19  5:24 AM  Result Value Ref Range   WBC 7.8 4.0 - 10.5 K/uL   RBC 4.34 4.22 - 5.81  MIL/uL   Hemoglobin 13.5 13.0 - 17.0 g/dL   HCT 81.1 91.4 - 78.2 %   MCV 94.2 80.0 - 100.0 fL   MCH 31.1 26.0 - 34.0 pg   MCHC 33.0 30.0 - 36.0 g/dL   RDW 95.6 21.3 - 08.6 %   Platelets 259 150 - 400 K/uL   nRBC 0.0 0.0 - 0.2 %   Neutrophils Relative % 66 %   Neutro Abs 5.1 1.7 - 7.7 K/uL   Lymphocytes Relative 24 %   Lymphs Abs 1.8 0.7 - 4.0 K/uL   Monocytes Relative 7 %   Monocytes Absolute 0.5 0.1 - 1.0 K/uL   Eosinophils Relative 3 %   Eosinophils Absolute 0.3 0.0 - 0.5 K/uL   Basophils Relative 0 %   Basophils Absolute 0.0 0.0 - 0.1 K/uL  Immature Granulocytes 0 %   Abs Immature Granulocytes 0.01 0.00 - 0.07 K/uL    Comment: Performed at [facility], [address] [place], [place] [address]  Basic metabolic panel     Status: Abnormal   Collection Time: 04/16/19  5:24 AM  Result Value Ref Range   Sodium 138 135 - 145 mmol/L   Potassium 3.2 (L) 3.5 - 5.1 mmol/L   Chloride 105 98 - 111 mmol/L   CO2 25 22 - 32 mmol/L   Glucose, Bld 96 70 - 99 mg/dL   BUN 9 6 - 20 mg/dL   Creatinine, Ser 6.040.82 0.61 - 1.24 mg/dL   Calcium 9.5 8.9 - 54.010.3 mg/dL   GFR calc non Af Amer >60 >60 mL/min   GFR calc Af Amer >60 >60 mL/min   Anion gap 8 5 - 15    Comment: Performed at [facility], [address] [place], [place] [address]  Rapid urine drug screen (hospital performed)     Status: Abnormal   Collection Time: 04/16/19  5:24 AM  Result Value Ref Range   Opiates NONE DETECTED NONE DETECTED   Cocaine NONE DETECTED NONE DETECTED   Benzodiazepines NONE DETECTED NONE DETECTED   Amphetamines NONE DETECTED NONE DETECTED   Tetrahydrocannabinol POSITIVE (A) NONE DETECTED   Barbiturates NONE DETECTED NONE DETECTED    Comment: (NOTE) DRUG SCREEN FOR MEDICAL PURPOSES ONLY.  IF CONFIRMATION IS NEEDED FOR ANY PURPOSE, NOTIFY LAB WITHIN 5 DAYS. LOWEST DETECTABLE LIMITS FOR URINE DRUG SCREEN Drug Class                     Cutoff (ng/mL) Amphetamine and metabolites     1000 Barbiturate and metabolites    200 Benzodiazepine                 200 Tricyclics and metabolites     300 Opiates and metabolites        300 Cocaine and metabolites        300 THC                            50 Performed at [facility], [address] [place], [place] [address]   Ethanol     Status: None   Collection Time: 04/16/19  5:24 AM  Result Value Ref Range   Alcohol, Ethyl (B) <10 <10 mg/dL    Comment: (NOTE) Lowest detectable limit for serum alcohol is 10 mg/dL. For medical purposes only. Performed at [facility], [address] [place], [place] [address]   Acetaminophen level     Status: Abnormal   Collection Time: 04/16/19  5:24 AM  Result Value Ref Range   Acetaminophen (Tylenol), Serum <10 (L) 10 - 30 ug/mL    Comment: (NOTE) Therapeutic concentrations vary significantly. A range of 10-30 ug/mL  may be an effective concentration for many patients. However, some  are best treated at concentrations outside of this range. Acetaminophen concentrations >150 ug/mL at 4 hours after ingestion  and >50 ug/mL at 12 hours after ingestion are often associated with  toxic reactions. Performed at [facility], [address] [place], [place] [address]   Salicylate level     Status: None   Collection Time: 04/16/19  5:24 AM  Result Value Ref Range   Salicylate Lvl <7.0 2.8 - 30.0 mg/dL    Comment: Performed at [facility], [address], [place], [place] [address]    Blood Alcohol level:  Lab Results  Component Value Date   ETH <10 04/16/2019   ETH <10 04/04/2019    Metabolic Disorder Labs:  No results found for: HGBA1C, MPG No results found for: PROLACTIN Lab Results  Component Value Date   CHOL 138 04/10/2019   TRIG 183 (H) 04/10/2019   HDL 49 04/10/2019   CHOLHDL 2.8 04/10/2019   VLDL 37 04/10/2019   LDLCALC 52 04/10/2019   LDLCALC 69 09/06/2017    Current Medications: Current Facility-Administered Medications   Medication Dose Route Frequency Provider Last Rate Last Dose  . acetaminophen (TYLENOL) tablet 650 mg  650 mg Oral Q6H PRN Antonieta Pert, MD      . alum & mag hydroxide-simeth (MAALOX/MYLANTA) 200-200-20 MG/5ML suspension 30 mL  30 mL Oral Q4H PRN Antonieta Pert, MD      . divalproex (DEPAKOTE) DR tablet 750 mg  750 mg Oral QPM Antonieta Pert, MD      . emtricitabine-tenofovir AF (DESCOVY) 200-25 MG per tablet 1 tablet  1 tablet Oral Daily Antonieta Pert, MD   1 tablet at 04/17/19 5366  . hydrOXYzine (ATARAX/VISTARIL) tablet 25 mg  25 mg Oral TID PRN Antonieta Pert, MD      . magnesium hydroxide (MILK OF MAGNESIA) suspension 30 mL  30 mL Oral Daily PRN Antonieta Pert, MD      . QUEtiapine (SEROQUEL) tablet 50 mg  50 mg Oral QHS Antonieta Pert, MD      . sertraline (ZOLOFT) tablet 25 mg  25 mg Oral Daily Antonieta Pert, MD   25 mg at 04/17/19 4403   PTA Medications: Medications Prior to Admission  Medication Sig Dispense Refill Last Dose  . bictegravir-emtricitabine-tenofovir AF (BIKTARVY) 50-200-25 MG TABS tablet Take 1 tablet by mouth daily.     . divalproex (DEPAKOTE) 500 MG DR tablet Take 500 mg by mouth 2 (two) times daily.     . QUEtiapine (SEROQUEL) 50 MG tablet Take 50 mg by mouth at bedtime.       Musculoskeletal: Strength & Muscle Tone: within normal limits Gait & Station: normal Patient leans: N/A  Psychiatric Specialty Exam: Physical Exam  Nursing note and vitals reviewed. Constitutional: He is oriented to person, place, and time. He appears well-developed and well-nourished.  HENT:  Head: Normocephalic and atraumatic.  Respiratory: Effort normal.  Neurological: He is alert and oriented to person, place, and time.    ROS  Blood pressure (!) 129/99, pulse 70, temperature 97.9 F (36.6 C), temperature source Oral, resp. rate 14, height 6' (1.829 m), weight 71.7 kg, SpO2 98 %.Body mass index is 21.43 kg/m.  General Appearance: Casual   Eye Contact:  Fair  Speech:  Normal Rate  Volume:  Normal  Mood:  Anxious, Depressed and Dysphoric  Affect:  Congruent  Thought Process:  Coherent and Descriptions of Associations: Circumstantial  Orientation:  Full (Time, Place, and Person)  Thought Content:  Hallucinations: Auditory  Suicidal Thoughts:  Yes.  without intent/plan  Homicidal Thoughts:  No  Memory:  Immediate;   Fair Recent;   Fair Remote;   Fair  Judgement:  Intact  Insight:  Lacking  Psychomotor Activity:  Increased  Concentration:  Concentration: Fair and Attention Span: Fair  Recall:  [employer] of Knowledge:  Fair  Language:  Fair  Akathisia:  Negative  Handed:  Right  AIMS (if indicated):  Assets:  Desire for Improvement Resilience  ADL's:  Intact  Cognition:  WNL  Sleep:       Treatment Plan Summary: Daily contact with patient to assess and evaluate symptoms and progress in treatment, Medication management and Plan : Patient is seen and examined.  Patient is a 33 year old male with the above-stated past psychiatric history was admitted secondary to suicidal ideation and auditory hallucinations.  He will be admitted to the hospital.  He will be integrated into the milieu.  He will be encouraged to attend groups.  He had really only been taking the Depakote for a couple of days.  He stated he was taken 500 mg p.o. every afternoon.  He also stated he was taking Seroquel 50 mg p.o. nightly.  His Depakote level in the emergency department was only 21 on 04/10/2019.  He felt as though that the Depakote was helping him.  I will increase his dosage to Depakote ER 750 mg p.o. nightly.  We will continue the Seroquel 50 mg p.o. nightly for sleep and psychotic symptoms.  We will also add Zoloft 25 mg p.o. daily for nightmares, flashbacks, anxiety and depression.  He stated that he has continued on his HIV medication and has been compliant with that.  That medication will be continued.  Review of his laboratories on  admission revealed a low potassium at 3.2, normal CBC with differential, the Depakote level as mentioned above.  His chlamydia, Neisseria and RPR were all negative.  His HIV 1 RNA quantitative was 4960.  Drug screen was positive for marijuana.  Observation Level/Precautions:  Detox 15 minute checks  Laboratory:  Chemistry Profile  Psychotherapy:    Medications:    Consultations:    Discharge Concerns:    Estimated LOS:  Other:     Physician Treatment Plan for Primary Diagnosis: <principal problem not specified> Long Term Goal(s): Improvement in symptoms so as ready for discharge  Short Term Goals: Ability to identify changes in lifestyle to reduce recurrence of condition will improve, Ability to verbalize feelings will improve, Ability to disclose and discuss suicidal ideas, Ability to demonstrate self-control will improve, Ability to identify and develop effective coping behaviors will improve, Ability to maintain clinical measurements within normal limits will improve and Ability to identify triggers associated with substance abuse/mental health issues will improve  Physician Treatment Plan for Secondary Diagnosis: Active Problems:   MDD (major depressive disorder)   Depression  Long Term Goal(s): Improvement in symptoms so as ready for discharge  Short Term Goals: Ability to identify changes in lifestyle to reduce recurrence of condition will improve, Ability to verbalize feelings will improve, Ability to disclose and discuss suicidal ideas, Ability to demonstrate self-control will improve, Ability to identify and develop effective coping behaviors will improve, Ability to maintain clinical measurements within normal limits will improve and Ability to identify triggers associated with substance abuse/mental health issues will improve  I certify that inpatient services furnished can reasonably be expected to improve the patient's condition.    Sharma Covert, MD 10/23/202011:33 AM

## 2019-04-17 NOTE — Progress Notes (Signed)
Patient ID: Justinn L Bisesi, male   DOB: 01/23/1986, 33 y.o.   MRN: [id number]  [place] NOVEL CORONAVIRUS (COVID-19) DAILY CHECK-OFF SYMPTOMS - answer yes or no to each - every day NO YES  Have you had a fever in the past 24 hours?  . Fever (Temp > 37.80C / 100F) X   Have you had any of these symptoms in the past 24 hours? . New Cough .  Sore Throat  .  Shortness of Breath .  Difficulty Breathing .  Unexplained Body Aches   X   Have you had any one of these symptoms in the past 24 hours not related to allergies?   . Runny Nose .  Nasal Congestion .  Sneezing   X   If you have had runny nose, nasal congestion, sneezing in the past 24 hours, has it worsened?  X   EXPOSURES - check yes or no X   Have you traveled outside the state in the past 14 days?  X   Have you been in contact with someone with a confirmed diagnosis of COVID-19 or PUI in the past 14 days without wearing appropriate PPE?  X   Have you been living in the same home as a person with confirmed diagnosis of COVID-19 or a PUI (household contact)?    X   Have you been diagnosed with COVID-19?    X              What to do next: Answered NO to all: Answered YES to anything:   Proceed with unit schedule Follow the [facility] Inpatient Flowsheet.   
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
## 2019-04-17 NOTE — Progress Notes (Signed)
Adult Psychoeducational Group Note  Date:  04/17/2019 Time:  10:00 AM  Group Topic/Focus:  Relapse Prevention Planning:   The focus of this group is to define relapse and discuss the need for planning to combat relapse.  Participation Level:  Active  Participation Quality:  Appropriate, Attentive, Redirectable, Sharing and Supportive  Affect:  Appropriate and Excited  Cognitive:  Alert, Appropriate and Lacking  Insight: Appropriate, Good and Improving  Engagement in Group:  Developing/Improving, Engaged, Improving, Monopolizing, Off Topic and Supportive  Modes of Intervention:  Discussion, Education, Exploration, Socialization and Support  Additional Comments:  Pt's discussed their setbacks in recovery and how their daily SMART goals were geared toward overcoming this. Pt's discussed the road for recovery and how this may not always be linear. Pt's identified various components of recovery and how these could aid in known triggers. Lastly, pt's discussed how they could use resources and coping skills to help deter future setbacks in recovery. Pt shared multiple times giving insight into what others were offering for coping strategies and resources in the future. Pt could become easily distracted and off topic, many times trying to pull other patients into sidebar conversations. Pt was easily redirectable. Pt shows little insight into their recovery, with boastfulness occurring regarding their excessive consumption of controlled substances. Pt did share their goal and how this would help them to move forward in recovery.  [name] 04/17/2019, 12:00 PM

## 2019-04-17 NOTE — Tx Team (Signed)
Interdisciplinary Treatment and Diagnostic Plan Update  04/17/2019 Time of Session: 9:00am  LINKIN VIZZINI MRN: [id number]  Principal Diagnosis: <principal problem not specified>  Secondary Diagnoses: Active Problems:   MDD (major depressive disorder)   Depression   Current Medications:  Current Facility-Administered Medications  Medication Dose Route Frequency Provider Last Rate Last Dose  . acetaminophen (TYLENOL) tablet 650 mg  650 mg Oral Q6H PRN Sharma Covert, MD      . alum & mag hydroxide-simeth (MAALOX/MYLANTA) 200-200-20 MG/5ML suspension 30 mL  30 mL Oral Q4H PRN Sharma Covert, MD      . divalproex (DEPAKOTE) DR tablet 750 mg  750 mg Oral QPM Sharma Covert, MD      . emtricitabine-tenofovir AF (DESCOVY) 200-25 MG per tablet 1 tablet  1 tablet Oral Daily Sharma Covert, MD   1 tablet at 04/17/19 3888  . hydrOXYzine (ATARAX/VISTARIL) tablet 25 mg  25 mg Oral TID PRN Sharma Covert, MD      . magnesium hydroxide (MILK OF MAGNESIA) suspension 30 mL  30 mL Oral Daily PRN Sharma Covert, MD      . QUEtiapine (SEROQUEL) tablet 50 mg  50 mg Oral QHS Sharma Covert, MD      . sertraline (ZOLOFT) tablet 25 mg  25 mg Oral Daily Sharma Covert, MD   25 mg at 04/17/19 7579   PTA Medications: Medications Prior to Admission  Medication Sig Dispense Refill Last Dose  . bictegravir-emtricitabine-tenofovir AF (BIKTARVY) 50-200-25 MG TABS tablet Take 1 tablet by mouth daily.     . divalproex (DEPAKOTE) 500 MG DR tablet Take 500 mg by mouth 2 (two) times daily.     . QUEtiapine (SEROQUEL) 50 MG tablet Take 50 mg by mouth at bedtime.       Patient Stressors: Financial difficulties Loss of child Marital or family conflict Medication change or noncompliance Occupational concerns Substance abuse Traumatic event  Patient Strengths: Ability for insight Average or above average intelligence Capable of independent living Communication skills General fund  of knowledge Motivation for treatment/growth Physical Health  Treatment Modalities: Medication Management, Group therapy, Case management,  1 to 1 session with clinician, Psychoeducation, Recreational therapy.   Physician Treatment Plan for Primary Diagnosis: <principal problem not specified> Long Term Goal(s):     Short Term Goals:    Medication Management: Evaluate patient's response, side effects, and tolerance of medication regimen.  Therapeutic Interventions: 1 to 1 sessions, Unit Group sessions and Medication administration.  Evaluation of Outcomes: Not Met  Physician Treatment Plan for Secondary Diagnosis: Active Problems:   MDD (major depressive disorder)   Depression  Long Term Goal(s):     Short Term Goals:       Medication Management: Evaluate patient's response, side effects, and tolerance of medication regimen.  Therapeutic Interventions: 1 to 1 sessions, Unit Group sessions and Medication administration.  Evaluation of Outcomes: Not Met   RN Treatment Plan for Primary Diagnosis: <principal problem not specified> Long Term Goal(s): Knowledge of disease and therapeutic regimen to maintain health will improve  Short Term Goals: Ability to participate in decision making will improve, Ability to verbalize feelings will improve, Ability to identify and develop effective coping behaviors will improve and Compliance with prescribed medications will improve  Medication Management: RN will administer medications as ordered by provider, will assess and evaluate patient's response and provide education to patient for prescribed medication. RN will report any adverse and/or side effects to prescribing provider.  Therapeutic Interventions: 1 on 1 counseling sessions, Psychoeducation, Medication administration, Evaluate responses to treatment, Monitor vital signs and CBGs as ordered, Perform/monitor CIWA, COWS, AIMS and Fall Risk screenings as ordered, Perform wound care  treatments as ordered.  Evaluation of Outcomes: Not Met   LCSW Treatment Plan for Primary Diagnosis: <principal problem not specified> Long Term Goal(s): Safe transition to appropriate next level of care at discharge, Engage patient in therapeutic group addressing interpersonal concerns.  Short Term Goals: Engage patient in aftercare planning with referrals and resources, Increase social support, Increase emotional regulation, Identify triggers associated with mental health/substance abuse issues and Increase skills for wellness and recovery  Therapeutic Interventions: Assess for all discharge needs, 1 to 1 time with Social worker, Explore available resources and support systems, Assess for adequacy in community support network, Educate family and significant other(s) on suicide prevention, Complete Psychosocial Assessment, Interpersonal group therapy.  Evaluation of Outcomes: Not Met   Progress in Treatment: Attending groups: No. New to unit. Participating in groups: No. Taking medication as prescribed: Yes. Toleration medication: Yes. Family/Significant other contact made: No, will contact:  supports if consents are granted. Patient understands diagnosis: Yes. Discussing patient identified problems/goals with staff: Yes. Medical problems stabilized or resolved: No. Denies suicidal/homicidal ideation: No. Issues/concerns per patient self-inventory: Yes.  New problem(s) identified: Yes, Describe:  homelessness, grief, no income  New Short Term/Long Term Goal(s): detox, medication management for mood stabilization; elimination of SI thoughts; development of comprehensive mental wellness/sobriety plan.  Patient Goals: Establish a discharge plan, find a place to stay, etc.  Discharge Plan or Barriers: CSW assessing for appropriate referrals. Patient was provided the contact information for [facility].   Reason for Continuation of Hospitalization:  Depression Medication stabilization Suicidal ideation Withdrawal symptoms  Estimated Length of Stay: 3-5 days  Attendees: Patient: Fred Wilson 04/17/2019 10:17 AM  Physician: Queen Blossom 04/17/2019 10:17 AM  Nursing: Elberta Fortis, RN 04/17/2019 10:17 AM  RN Care Manager: 04/17/2019 10:17 AM  Social Worker: Stephanie Acre, [place] 04/17/2019 10:17 AM  Recreational Therapist:  04/17/2019 10:17 AM  Other:  04/17/2019 10:17 AM  Other:  04/17/2019 10:17 AM  Other: 04/17/2019 10:17 AM    Scribe for Treatment Team: Joellen Jersey, [place] 04/17/2019 10:17 AM

## 2019-04-17 NOTE — Progress Notes (Signed)
Patient was provided shelter resources and the contact information for [facility] to secure a shelter bed. Patient was encouraged to call this morning in an effort to coordinate discharge planning for early next week.   Stephanie Acre, LCSW-A Clinical Social Worker

## 2019-04-18 DIAGNOSIS — F333 Major depressive disorder, recurrent, severe with psychotic symptoms: Secondary | ICD-10-CM | POA: Diagnosis not present

## 2019-04-18 DIAGNOSIS — F14150 Cocaine abuse with cocaine-induced psychotic disorder with delusions: Secondary | ICD-10-CM | POA: Diagnosis not present

## 2019-04-18 LAB — TSH: TSH: 2.438 u[IU]/mL (ref 0.350–4.500)

## 2019-04-18 MED ORDER — QUETIAPINE FUMARATE 50 MG PO TABS: 50.0000 mg | ORAL_TABLET | ORAL | Status: DC

## 2019-04-18 MED ORDER — QUETIAPINE FUMARATE 200 MG PO TABS
200.0000 mg | ORAL_TABLET | Freq: Every day | ORAL | Status: DC
  Administered 2019-04-18: 200 mg via ORAL
  Filled 2019-04-18 (×3): qty 1

## 2019-04-18 MED ORDER — SERTRALINE HCL 50 MG PO TABS
50.0000 mg | ORAL_TABLET | Freq: Every day | ORAL | Status: DC
  Administered 2019-04-19: 50 mg via ORAL
  Filled 2019-04-18 (×2): qty 1

## 2019-04-18 MED ORDER — QUETIAPINE FUMARATE 100 MG PO TABS: 100.0000 mg | ORAL_TABLET | Freq: Every day | ORAL | Status: DC

## 2019-04-18 NOTE — Progress Notes (Signed)
Pt yelling very loud in the dayroom, I am refuse vitals, Pt. Said this 3 times. Once 4 people vitals taken pt wants to know why he vitals are not taken. Staff took pt blood pressure. Pt yells again I refuse lab work. Then pt said mommy sharon talked me into having lab work done. A staff member came to take 300 hall pictures. Pt said you already have my picture on profile. I am not taking a picture. Staff remind pt no one can take coffee to cafeteria it needs to be in your room. Staff asked pt to go one at a time for breakfast. Pt said I am not doing this I am going to breakfast now. Pt goes to cafeteria with coffee down the hall with it in his hand, RN notify

## 2019-04-18 NOTE — Progress Notes (Signed)
[facility] MD Progress Note  04/18/2019 1:35 PM Fred Wilson  MRN:  [id number]  Subjective: Fred Wilson" reports, "I'm not doing well today. My experience in this hospital is not good at all. The unprofessionalism that I have observed/experienced being here in really making me sick to my stomach. I lost & buried my son this past Tuesday. I spent all my money planning his funeral/burial, as a result, lost my apartment. I got evicted. I came here for help, but, no one seem to be helping me. All I asked is to be allowed to stay here for a while or help me find a new apartment, no one seem to be listening to me. What I got from the social worker was, I will be discharged from this place in 3-4 days or so. I'm gathering information while I'm here because I'm definitely going to sue this hospital once I get of here. I need my medicines adjusted because I'm still not doing well at all".    Objective: Patient is a 33 year old male with a past psychiatric history significant for posttraumatic stress disorder, possible bipolar disorder, cocaine use disorder and marijuana use disorder who presented to the [facility] emergency department on 10/21 secondary to right ankle pain as well as auditory and visual hallucinations. The patient stated he had come to the emergency room earlier this month, and was discharged and went to [facility] to their crisis center. He stayed there until approximately 5 days ago. He followed up there and was given Depakote and Seroquel. He stated that he had been having auditory and visual hallucinations, and was not sleeping. Unfortunately within the last 7 days his 33 year old died from coronavirus related issues. The funeral was 3 days ago. His other child had also died at a very young age.  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 ADHD (attention deficit hyperactivity disorder)   . Arthritis   . Asthma   . Bipolar 1 disorder ([place])   . Bronchitis   . Drug-seeking behavior   . GERD (gastroesophageal reflux disease)   . HIV (human immunodeficiency virus infection) ([place])   . Schizophrenia ([place])   . Seasonal allergies   . Substance abuse ([place])   . Suicide attempt [facility])     Past Surgical History:  Procedure Laterality Date  . FRACTURE SURGERY    . KNEE SURGERY     Family History: History reviewed. No pertinent family history.  Family Psychiatric  History: See H&P  Social History:  Social History   Substance and Sexual Activity  Alcohol Use No     Social History   Substance and Sexual Activity  Drug Use Yes  . Types: Marijuana, Cocaine    Social History   Socioeconomic History  . Marital status: Single    Spouse name: Not on file  . Number of children: 4  . Years of education: 4  . Highest education level: Not on file  Occupational History  . Not on file  Social Needs  . Financial resource strain: Not on file  . Food insecurity    Worry: Not on file    Inability: Not on file  . Transportation needs    Medical: Not on file  Non-medical: Not on file  Tobacco Use  . Smoking status: Current Every Day Smoker    Packs/day: 1.00    Years: 5.00    Pack years: 5.00    Types: Cigarettes  . Smokeless tobacco: Never Used  Substance and Sexual Activity  . Alcohol use: No  . Drug use: Yes    Types: Marijuana, Cocaine  . Sexual activity: Yes    Birth control/protection: None  Lifestyle  . Physical activity    Days per week: Not on file    Minutes per session: Not on file  . Stress: Not on file  Relationships  . Social Musicianconnections    Talks on phone: Not on file    Gets together: Not on file    Attends religious service: Not on  file    Active member of club or organization: Not on file    Attends meetings of clubs or organizations: Not on file    Relationship status: Not on file  Other Topics Concern  . Not on file  Social History Narrative   ** Merged History Encounter **       Additional Social History:   Sleep: Good  Appetite:  Good  Current Medications: Current Facility-Administered Medications  Medication Dose Route Frequency Provider Last Rate Last Dose  . acetaminophen (TYLENOL) tablet 650 mg  650 mg Oral Q6H PRN Antonieta Pertlary, Greg Lawson, MD      . alum & mag hydroxide-simeth (MAALOX/MYLANTA) 200-200-20 MG/5ML suspension 30 mL  30 mL Oral Q4H PRN Antonieta Pertlary, Greg Lawson, MD      . divalproex (DEPAKOTE) DR tablet 750 mg  750 mg Oral QPM Antonieta Pertlary, Greg Lawson, MD   750 mg at 04/17/19 1753  . emtricitabine-tenofovir AF (DESCOVY) 200-25 MG per tablet 1 tablet  1 tablet Oral Daily Antonieta Pertlary, Greg Lawson, MD   1 tablet at 04/18/19 0825  . hydrOXYzine (ATARAX/VISTARIL) tablet 25 mg  25 mg Oral TID PRN Antonieta Pertlary, Greg Lawson, MD   25 mg at 04/18/19 0826  . magnesium hydroxide (MILK OF MAGNESIA) suspension 30 mL  30 mL Oral Daily PRN Antonieta Pertlary, Greg Lawson, MD      . QUEtiapine (SEROQUEL) tablet 50 mg  50 mg Oral QHS Antonieta Pertlary, Greg Lawson, MD   50 mg at 04/17/19 2134  . sertraline (ZOLOFT) tablet 25 mg  25 mg Oral Daily Antonieta Pertlary, Greg Lawson, MD   25 mg at 04/18/19 16100826   Lab Results:  Results for orders placed or performed during the hospital encounter of 04/16/19 (from the past 48 hour(s))  TSH     Status: None   Collection Time: 04/18/19  6:31 AM  Result Value Ref Range   TSH 2.438 0.350 - 4.500 uIU/mL    Comment: Performed by a 3rd Generation assay with a functional sensitivity of <=0.01 uIU/mL. Performed at [facility], [address] [place], [place] [address]    Blood Alcohol level:  Lab Results  Component Value Date   ETH <10 04/16/2019   ETH <10 04/04/2019   Metabolic Disorder Labs: No results found  for: HGBA1C, MPG No results found for: PROLACTIN Lab Results  Component Value Date   CHOL 138 04/10/2019   TRIG 183 (H) 04/10/2019   HDL 49 04/10/2019   CHOLHDL 2.8 04/10/2019   VLDL 37 04/10/2019   LDLCALC 52 04/10/2019   LDLCALC 69 09/06/2017    Physical Findings: AIMS:  , ,  ,  ,    CIWA:    COWS:  Musculoskeletal: Strength & Muscle Tone: within normal limits Gait & Station: normal Patient leans: N/A  Psychiatric Specialty Exam: Physical Exam  Nursing note and vitals reviewed. Constitutional: He is oriented to person, place, and time. He appears well-developed.  Neck: Normal range of motion.  Genitourinary:    Genitourinary Comments: Deferred   Musculoskeletal: Normal range of motion.  Neurological: He is alert and oriented to person, place, and time.  Skin: Skin is warm and dry.    Review of Systems  Constitutional: Negative for chills and fever.  Respiratory: Negative for cough, shortness of breath and wheezing.   Cardiovascular: Negative for chest pain and palpitations.  Gastrointestinal: Negative for heartburn, nausea and vomiting.  Neurological: Negative for dizziness and headaches.  Psychiatric/Behavioral: Positive for depression and substance abuse (Hx. THC use disorder). Negative for hallucinations, memory loss and suicidal ideas. The patient is not nervous/anxious and does not have insomnia.     Blood pressure (!) 143/99, pulse 63, temperature 97.9 F (36.6 C), temperature source Oral, resp. rate 14, height 6' (1.829 m), weight 71.7 kg, SpO2 98 %.Body mass index is 21.43 kg/m.  General Appearance: Casual  Eye Contact:  Fair  Speech:  Normal Rate  Volume:  Normal  Mood:  Anxious, Depressed and Dysphoric  Affect:  Congruent  Thought Process:  Coherent and Descriptions of Associations: Circumstantial  Orientation:  Full (Time, Place, and Person)  Thought Content:  Hallucinations: Auditory  Suicidal Thoughts:  Yes.  without intent/plan  Homicidal  Thoughts:  No  Memory:  Immediate;   Fair Recent;   Fair Remote;   Fair  Judgement:  Intact  Insight:  Lacking  Psychomotor Activity:  Increased  Concentration:  Concentration: Fair and Attention Span: Fair  Recall:  [employer] of Knowledge:  Fair  Language:  Fair  Akathisia:  Negative  Handed:  Right  AIMS (if indicated):     Assets:  Desire for Improvement Resilience  ADL's:  Intact  Cognition:  WNL    Sleep:  Number of Hours: 6   Treatment Plan Summary: Daily contact with patient to assess and evaluate symptoms and progress in treatment and Medication management.  -Continue inpatient hospitalization.  -Will continue today 04/18/2019 plan as below except where it is noted.  -Mood control  -Increased Seroquel from 50 mg to 100 mg po qhs   -Continue Depakote 750 mg po qhs.             -Continue Sertraline 25 mg po daily for depression.  -Anxiety  -Continue atarax 25 mg po q8h prn anxiety  -HIV  -Continue emtricitabine-tenofovir AF 1 tablet po daily.  -Encourage participation in groups and therapeutic milieu  -Disposition planning will be ongoing  Armandina Stammer, NP, PMHNP, FNP-BC 04/18/2019, 1:35 PM

## 2019-04-18 NOTE — Progress Notes (Signed)
Patient ID: Fred Wilson, male   DOB: 1986/01/07, 33 y.o.   MRN: [id number]  [facility] NOVEL CORONAVIRUS (COVID-19) DAILY CHECK-OFF SYMPTOMS - answer yes or no to each - every day NO YES  Have you had a fever in the past 24 hours?  . Fever (Temp > 37.80C / 100F) X   Have you had any of these symptoms in the past 24 hours? . New Cough .  Sore Throat  .  Shortness of Breath .  Difficulty Breathing .  Unexplained Body Aches   X   Have you had any one of these symptoms in the past 24 hours not related to allergies?   . Runny Nose .  Nasal Congestion .  Sneezing   X   If you have had runny nose, nasal congestion, sneezing in the past 24 hours, has it worsened?  X   EXPOSURES - check yes or no X   Have you traveled outside the state in the past 14 days?  X   Have you been in contact with someone with a confirmed diagnosis of COVID-19 or PUI in the past 14 days without wearing appropriate PPE?  X   Have you been living in the same home as a person with confirmed diagnosis of COVID-19 or a PUI (household contact)?    X   Have you been diagnosed with COVID-19?    X              What to do next: Answered NO to all: Answered YES to anything:   Proceed with unit schedule Follow the [facility] Inpatient Flowsheet.

## 2019-04-18 NOTE — Plan of Care (Signed)
  Problem: Safety: Goal: Periods of time without injury will increase Outcome: Progressing   Problem: Education: Goal: Ability to make informed decisions regarding treatment will improve Outcome: Progressing   Problem: Medication: Goal: Compliance with prescribed medication regimen will improve Outcome: Progressing   

## 2019-04-18 NOTE — [facility] Group Notes (Signed)
LCSW Group Therapy Note  04/18/2019   10:00-11:00am   Type of Therapy and Topic:  Group Therapy: Anger Analysis Using CBT  Participation Level:  Active   Description of Group:   In this group, patients learned how to recognize the physical, cognitive, emotional, and behavioral responses they have to anger-provoking situations.  They identified a recent time they became angry and how they reacted.  They identified the thoughts they had at the time they last were angry, and how this influenced their subsequent actions.  Group had a very disruptive member for the last few days, and during this group that individual was angry, intrusive, and monopolizing.  CSW used it as a time to teach how different thoughts about a situation can lead to different actions that are either healthy or unhealthy.  The interaction explored during Cognitive-Behavioral Therapy was explained.  Therapeutic Goals: 1. Patients will remember his/her last incident of anger and the surrounding circumstances 2. Patients will identify how they felt emotionally and physically, what their thoughts were at the time, and what actions they took 3. Patients will explore possible changes in their feelings and actions if their thoughts had been analyzed and actually found to be inaccurate or unhelpful. 4. Patients had the opportunity to use their calming techniques and self-advocacy, as well as to observe healthy coping skills from group leader, to address a difficult, verbose, blaming participant.  Summary of Patient Progress:  The patient shared that his most recent time of anger was now, continually since being hospitalzed, due to frustrations with various staff members.  His thoughts at the time included "that's ridiculous."  He was angry and monopolizing, would not give group leader a chance to talk about the actual topic but rather remained focus on specific gripes about staff members.  He was told that this was not the place to  discuss it, but that he could fill out a survey and put in our drop box before leaving the hospital.  He said that surveys about staff performance "should be given to [place] every day, every single day."  When told by other group members that we had a very short time for therapy and needed to move forward, he was angry again, saying that "She asked about what makes [place] angry."  He was also told that he could call Patient Experience at any point, left the room to see the doctor and get that number.  Therapeutic Modalities:   Cognitive Behavioral Therapy  Maretta Los, MSW, LCSW

## 2019-04-18 NOTE — Progress Notes (Signed)
Patient ID: Fred Wilson, male   DOB: 09-Jan-1986, 33 y.o.   MRN: [id number] D: Patient in the dayroom play cards with peers. Pt was very loud, intrusive and acting silly without acknowledging others in the room. Pt had to be redirected to calm down and keep voice low. Denies  SI/HI/AVH and pain.  A: Support and encouragement offered as needed. Medications administered as prescribed.  R: Patient is safe on unit. Will continue to monitor  for safety and stability.

## 2019-04-19 ENCOUNTER — Encounter (HOSPITAL_COMMUNITY): Payer: Self-pay

## 2019-04-19 ENCOUNTER — Emergency Department (HOSPITAL_COMMUNITY)
Admission: EM | Admit: 2019-04-19 | Discharge: 2019-04-19 | Discharge status: Absent without leave | Payer: Medicaid Other

## 2019-04-19 ENCOUNTER — Inpatient Hospital Stay (HOSPITAL_COMMUNITY): Payer: Self-pay

## 2019-04-19 DIAGNOSIS — F329 Major depressive disorder, single episode, unspecified: Secondary | ICD-10-CM | POA: Diagnosis present

## 2019-04-19 DIAGNOSIS — F14159 Cocaine abuse with cocaine-induced psychotic disorder, unspecified: Secondary | ICD-10-CM | POA: Diagnosis present

## 2019-04-19 LAB — RPR: RPR Ser Ql: NONREACTIVE

## 2019-04-19 LAB — HIV-1 INTEGRASE GENOTYPE

## 2019-04-19 LAB — HIV RNA, RTPCR W/R GT (RTI, PI,INT)
HIV 1 RNA Quant: 4960 copies/mL — ABNORMAL HIGH
HIV-1 RNA Quant, Log: 3.7 Log copies/mL — ABNORMAL HIGH

## 2019-04-19 LAB — HIV-1 GENOTYPE: HIV-1 Genotype: DETECTED — AB

## 2019-04-19 MED ORDER — RISPERIDONE 1 MG PO TABS
1.0000 mg | ORAL_TABLET | Freq: Every day | ORAL | Status: DC
  Filled 2019-04-19: qty 1

## 2019-04-19 MED ORDER — RISPERIDONE 0.5 MG PO TABS
0.5000 mg | ORAL_TABLET | Freq: Every day | ORAL | Status: DC
  Administered 2019-04-19: 0.5 mg via ORAL
  Filled 2019-04-19 (×3): qty 1

## 2019-04-19 NOTE — Progress Notes (Signed)
  [facility] Adult Case Management Discharge Plan :  Will you be returning to the same living situation after discharge:  Yes,  back to same situation At discharge, do you have transportation home?: No.  Will need to take public transportation Do you have the ability to pay for your medications: No.  Has no insurance, no income  Release of information consent forms not needed, no specific referrals made.  Patient to Follow up at: Follow-up [facility] Follow up on 05/11/2019.   Why: Please attend your follow up appointment on Monday, 11/16 at 10:00a.  Be sure to bring your photo ID and current medications.  Contact information: [address] [place] [address] ph: [phone] fx: [phone]       [facility] Follow up.   Contact information: [place] [address] ph: [phone] fx: [phone]       [facility] Follow up.   Contact information: [address] [place] [address] [phone]           Next level of care provider has access to [place] and Suicide Prevention discussed: No.  No consents were signed for contact with anyone  Have you used any form of tobacco in the last 30 days? (Cigarettes, Smokeless Tobacco, Cigars, and/or Pipes): Yes  Has patient been referred to the Quitline?: Patient refused referral  Patient has been referred for addiction treatment: Yes  Maretta Los, LCSW 04/19/2019, 5:45 PM

## 2019-04-19 NOTE — Progress Notes (Signed)
   04/19/19 0930  What Happened  Was fall witnessed? No  Was patient injured? No  Patient found on floor  Found by Staff-comment  Stated prior activity ambulating-unassisted

## 2019-04-19 NOTE — Progress Notes (Signed)
Early this morning the patient was asked to get his vitals taken. He stated that he wanted coffee. This writer told him to first get his temp. taken then he could have coffee. The patient became upset and stormed out the dayroom. He later came back screaming and shouting at this writer stating the "he wanted coffee and he is not going to get his vitals taken. This Probation officer asked him again and in soft tone. " come and have your temp. taken and he can have coffee". The patient started screaming and being intimidating to this Probation officer. He insisted that he was not going to get his vitals taken. The writer gave him some coffee.  Patient was taken down to the cafeteria and he told the server that he wanted three slices of french toast. The server told him he can get two. He started to become upset and threaten to the server stating that " he is the wrong one". This Probation officer had security called to [employer].    Fred Wilson

## 2019-04-19 NOTE — Progress Notes (Signed)
[facility] MD Progress Note  04/19/2019 9:43 AM Fred Wilson  MRN:  [id number] Subjective: Patient is a 33 year old male with a past psychiatric history significant for posttraumatic stress disorder, possible bipolar disorder, cocaine use disorder as well as marijuana use disorder who originally presented to the [facility] emergency room on 04/15/2019 secondary to right ankle pain, and had been discharged and sent to [facility] to the crisis center.  He had been there for approximately 5 days, and then sent to our facility for evaluation.  Objective: Patient is seen and examined.  Patient is a 33 year old male with the above-stated past psychiatric history who is seen in follow-up.  Nursing staff asked me to see this patient early on the day because his behavior.  Review of the chart showed that he had been verbally abusive to nursing staff, social work as well as dietary.  He was very demanding and implied.  He stated that he was angry because the fact that he had not gotten what he wanted.  I spent 30 minutes with the patient this morning going over his behavior.  He was very upset over the fact that we would not be able to find him housing, and that he was very upset about this because he had used all of his resources to pay for his child's funeral, and then he got evicted.  He stated that he had "passed out last night", but no wanted done anything about it.  We discussed the possibility of orthostatic hypotension, and we discussed switching from Seroquel to a different medication.  He was in favor of this.  His Seroquel was stopped, and we put him on half a milligram of Risperdal during the day and 1 mg p.o. nightly but continued his Depakote.  Shortly thereafter he reported having fallen on some spilled water on the floor.  The decision was made to send him to the emergency department for evaluation.  Also in the discussion earlier today he threatened to call the patient advocate, to file a discrimination suit  against this because he felt as though he was being discriminated against.  He denied suicidal ideation at the end of our discussion.  Principal Problem: MDD (major depressive disorder) Diagnosis: Principal Problem:   MDD (major depressive disorder) Active Problems:   Cocaine abuse with cocaine-induced psychotic disorder ([place])   Depression  Total Time spent with patient: 30 minutes  Past Psychiatric History: See admission H&P  Past Medical History:  Past Medical History:  Diagnosis Date  . ADHD (attention deficit hyperactivity disorder)   . Arthritis   . Asthma   . Bipolar 1 disorder ([name])   . Bronchitis   . Drug-seeking behavior   . GERD (gastroesophageal reflux disease)   . HIV (human immunodeficiency virus infection) ([place])   . Schizophrenia ([place])   . Seasonal allergies   . Substance abuse ([place])   . Suicide attempt [facility])     Past Surgical History:  Procedure Laterality Date  . FRACTURE SURGERY    . KNEE SURGERY     Family History: History reviewed. No pertinent family history. Family Psychiatric  History: See admission H&P Social History:  Social History   Substance and Sexual Activity  Alcohol Use No     Social History   Substance and Sexual Activity  Drug Use Yes  . Types: Marijuana, Cocaine    Social History   Socioeconomic History  . Marital status: Single    Spouse name: Not on file  . Number of  children: 4  . Years of education: 28  . Highest education level: Not on file  Occupational History  . Not on file  Social Needs  . Financial resource strain: Not on file  . Food insecurity    Worry: Not on file    Inability: Not on file  . Transportation needs    Medical: Not on file    Non-medical: Not on file  Tobacco Use  . Smoking status: Current Every Day Smoker    Packs/day: 1.00    Years: 5.00    Pack years: 5.00    Types: Cigarettes  . Smokeless tobacco: Never Used  Substance and Sexual Activity  . Alcohol use: No  . Drug use: Yes     Types: Marijuana, Cocaine  . Sexual activity: Yes    Birth control/protection: None  Lifestyle  . Physical activity    Days per week: Not on file    Minutes per session: Not on file  . Stress: Not on file  Relationships  . Social Herbalist on phone: Not on file    Gets together: Not on file    Attends religious service: Not on file    Active member of club or organization: Not on file    Attends meetings of clubs or organizations: Not on file    Relationship status: Not on file  Other Topics Concern  . Not on file  Social History Narrative   ** Merged History Encounter **       Additional Social History:                         Sleep: Good  Appetite:  Good  Current Medications: Current Facility-Administered Medications  Medication Dose Route Frequency Provider Last Rate Last Dose  . acetaminophen (TYLENOL) tablet 650 mg  650 mg Oral Q6H PRN Sharma Covert, MD   650 mg at 04/18/19 1740  . alum & mag hydroxide-simeth (MAALOX/MYLANTA) 200-200-20 MG/5ML suspension 30 mL  30 mL Oral Q4H PRN Sharma Covert, MD      . divalproex (DEPAKOTE) DR tablet 750 mg  750 mg Oral QPM Sharma Covert, MD   750 mg at 04/18/19 1741  . emtricitabine-tenofovir AF (DESCOVY) 200-25 MG per tablet 1 tablet  1 tablet Oral Daily Sharma Covert, MD   1 tablet at 04/19/19 1610  . hydrOXYzine (ATARAX/VISTARIL) tablet 25 mg  25 mg Oral TID PRN Sharma Covert, MD   25 mg at 04/18/19 2203  . magnesium hydroxide (MILK OF MAGNESIA) suspension 30 mL  30 mL Oral Daily PRN Sharma Covert, MD      . QUEtiapine (SEROQUEL) tablet 50 mg  50 mg Oral NOW Sharma Covert, MD      . risperiDONE (RISPERDAL) tablet 0.5 mg  0.5 mg Oral Daily Sharma Covert, MD   0.5 mg at 04/19/19 9604  . risperiDONE (RISPERDAL) tablet 1 mg  1 mg Oral QHS Sharma Covert, MD      . sertraline (ZOLOFT) tablet 50 mg  50 mg Oral Daily Sharma Covert, MD   50 mg at 04/19/19 5409    Lab  Results:  Results for orders placed or performed during the hospital encounter of 04/16/19 (from the past 48 hour(s))  TSH     Status: None   Collection Time: 04/18/19  6:31 AM  Result Value Ref Range   TSH 2.438 0.350 - 4.500 uIU/mL  Comment: Performed by a 3rd Generation assay with a functional sensitivity of <=0.01 uIU/mL. Performed at [facility], [place] [address], [place], [place] [address]     Blood Alcohol level:  Lab Results  Component Value Date   ETH <10 04/16/2019   ETH <10 26/94/8546    Metabolic Disorder Labs: No results found for: HGBA1C, MPG No results found for: PROLACTIN Lab Results  Component Value Date   CHOL 138 04/10/2019   TRIG 183 (H) 04/10/2019   HDL 49 04/10/2019   CHOLHDL 2.8 04/10/2019   VLDL 37 04/10/2019   LDLCALC 52 04/10/2019   LDLCALC 69 09/06/2017    Physical Findings: AIMS: Facial and Oral Movements Muscles of Facial Expression: None, normal Lips and Perioral Area: None, normal Jaw: None, normal Tongue: None, normal,Extremity Movements Upper (arms, wrists, hands, fingers): None, normal Lower (legs, knees, ankles, toes): None, normal, Trunk Movements Neck, shoulders, hips: None, normal, Overall Severity Severity of abnormal movements (highest score from questions above): None, normal Incapacitation due to abnormal movements: None, normal Patient's awareness of abnormal movements (rate only patient's report): No Awareness, Dental Status Current problems with teeth and/or dentures?: No Does patient usually wear dentures?: No  CIWA:    COWS:     Musculoskeletal: Strength & Muscle Tone: within normal limits Gait & Station: normal Patient leans: N/A  Psychiatric Specialty Exam: Physical Exam  Nursing note and vitals reviewed. Constitutional: He is oriented to person, place, and time. He appears well-developed and well-nourished.  HENT:  Head: Normocephalic and atraumatic.  Respiratory: Effort normal.   Neurological: He is alert and oriented to person, place, and time.    ROS  Blood pressure (!) 143/99, pulse 63, temperature 97.9 F (36.6 C), temperature source Oral, resp. rate 14, height 6' (1.829 m), weight 71.7 kg, SpO2 98 %.Body mass index is 21.43 kg/m.  General Appearance: Casual  Eye Contact:  Good  Speech:  Normal Rate  Volume:  Normal  Mood:  Irritable  Affect:  Congruent  Thought Process:  Coherent and Descriptions of Associations: Intact  Orientation:  Full (Time, Place, and Person)  Thought Content:  Logical  Suicidal Thoughts:  No  Homicidal Thoughts:  No  Memory:  Immediate;   Good Recent;   Good Remote;   Good  Judgement:  Impaired  Insight:  Lacking  Psychomotor Activity:  Normal  Concentration:  Concentration: Fair and Attention Span: Fair  Recall:  [employer] of Knowledge:  Fair  Language:  Good  Akathisia:  Negative  Handed:  Right  AIMS (if indicated):     Assets:  Desire for Improvement Resilience  ADL's:  Intact  Cognition:  WNL  Sleep:  Number of Hours: 5.75     Treatment Plan Summary: Daily contact with patient to assess and evaluate symptoms and progress in treatment, Medication management and Plan : Patient is seen and examined.  Patient is a 33 year old male with the above-stated past psychiatric history who is seen in follow-up.   Diagnosis: #1 unspecified bipolar disorder, #2 marijuana use disorder, #3 posttraumatic stress disorder, #4 bereavement, #5 HIV, #6 housing issues  Patient is seen in follow-up.  He was again initially quite irritated with staff and not feeling as though his needs were being met.  I had a 30-minute discussion with the patient, and at the end of that.  I thought things were better.  He had calm, his speech was of normal rate, volume and tone.  He was not really showing any  signs or symptoms of illness.  Because of what sounded like orthostatic hypotension I told him we would get rid of the Seroquel and switch to  Risperdal.  I wrote for Risperdal 0.5 mg p.o. daily and 1 mg p.o. nightly.  Shortly thereafter he had some form of a fall.  He stated he injured his leg.  He had previously gone to the emergency room for original complaint of ankle issues.  Given he had already threatened our facility with some form of a legal action I think that we need to go on and make sure that he is evaluated from a medical standpoint that there are no injuries present.  I suspect that he will most likely be discharged from the emergency room to home if his medical work-up is negative. 1.  Continue Depakote DR 750 mg p.o. every afternoon for mood stability. 2.  Continue Descovy 1 tablet p.o. daily for HIV disease. 3.  Stop Seroquel. 4.  Start Risperdal 0.5 mg p.o. daily and 1 mg p.o. nightly for mood stability. 5.  Continue sertraline 50 mg p.o. daily for depression and anxiety. 6.  We will have patient transported to the emergency room for evaluation of possible injury from fall. 7.  Possible discharge from the emergency room after evaluation.  Sharma Covert, MD 04/19/2019, 9:43 AM

## 2019-04-19 NOTE — Progress Notes (Signed)
[place] NOVEL CORONAVIRUS (COVID-19) DAILY CHECK-OFF SYMPTOMS - answer yes or no to each - every day NO YES  Have you had a fever in the past 24 hours?  . Fever (Temp > 37.80C / 100F) X   Have you had any of these symptoms in the past 24 hours? . New Cough .  Sore Throat  .  Shortness of Breath .  Difficulty Breathing .  Unexplained Body Aches   X   Have you had any one of these symptoms in the past 24 hours not related to allergies?   . Runny Nose .  Nasal Congestion .  Sneezing   X   If you have had runny nose, nasal congestion, sneezing in the past 24 hours, has it worsened?  X   EXPOSURES - check yes or no X   Have you traveled outside the state in the past 14 days?  X   Have you been in contact with someone with a confirmed diagnosis of COVID-19 or PUI in the past 14 days without wearing appropriate PPE?  X   Have you been living in the same home as a person with confirmed diagnosis of COVID-19 or a PUI (household contact)?    X   Have you been diagnosed with COVID-19?    X              What to do next: Answered NO to all: Answered YES to anything:   Proceed with unit schedule Follow the [facility] Inpatient Flowsheet.   

## 2019-04-19 NOTE — Progress Notes (Signed)
Patient observed laying on his side in the hall holding his leg - pt reported that he "slipped on slick spot on floor" fell on his right side and hit left knee. Pt denies hitting his head. Pt reports pain 10/10 @ left leg. No obvious signs of injury noted. VS stable. NP made aware. Pt assisted to wheelchair, was rolled into dayroom where he was given ice for his knee.

## 2019-04-19 NOTE — ED Notes (Signed)
Patient is now on phone, all moaning and groaning in pain have stopped.
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
## 2019-04-19 NOTE — Progress Notes (Signed)
This morning Pt became very angry that coffee could not be set out for the patients in the dayroom.  Pt was swearing loudly and cursing at MHT. MHT requested to take patient's temperature and stated that she would then get him coffee.  Pt continued loudly cursing and berating MHT.   Pt then came out to nurses station and stated that he had passed out during night in the bathroom.  Pt examined and no injury noted, patient denies pain at this time.  MHT states that patient was in bathroom once during night, but that she was able to hear him flush the toilet.  MHT states that patient was visible in room during checks.  Pt instructed to let staff know immediately if he has more dizziness or if there is another such incidentt unwitnessed.

## 2019-04-19 NOTE — ED Triage Notes (Signed)
Patient arrived via [facility] from [facility] with Sitter at bedside. Patient is Voluntary. Patient stated he slipped on water and fell with pain in left knee and pain on right side. No LOC, chest pain or SOB. Patient is AOx4 and ambulatory.
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
## 2019-04-19 NOTE — ED Notes (Signed)
RN has just informed that patient will now be discharged from ED. RN has also been informed that patient can get belongings from behavioral health after being discharged. Patient Shoes, clothes and other personal belongings are still at [facility]. RN has security and MHT going to [facility] to acquire patient belongings and bring them back to [facility] so patient may be discharged from ED. Charge Environmental health practitioner have been made aware of situation.

## 2019-04-19 NOTE — Progress Notes (Signed)
[place] NOVEL CORONAVIRUS (COVID-19) DAILY CHECK-OFF SYMPTOMS - answer yes or no to each - every day NO YES  Have you had a fever in the past 24 hours?  . Fever (Temp > 37.80C / 100F) X   Have you had any of these symptoms in the past 24 hours? . New Cough .  Sore Throat  .  Shortness of Breath .  Difficulty Breathing .  Unexplained Body Aches   X   Have you had any one of these symptoms in the past 24 hours not related to allergies?   . Runny Nose .  Nasal Congestion .  Sneezing   X   If you have had runny nose, nasal congestion, sneezing in the past 24 hours, has it worsened?  X   EXPOSURES - check yes or no X   Have you traveled outside the state in the past 14 days?  X   Have you been in contact with someone with a confirmed diagnosis of COVID-19 or PUI in the past 14 days without wearing appropriate PPE?  X   Have you been living in the same home as a person with confirmed diagnosis of COVID-19 or a PUI (household contact)?    X   Have you been diagnosed with COVID-19?    X              What to do next: Answered NO to all: Answered YES to anything:   Proceed with unit schedule Follow the [facility] Inpatient Flowsheet.   

## 2019-04-19 NOTE — Progress Notes (Signed)
D.  Pt initially pleasant on approach, but appears very labile.  Pt perceived that he was spoken to in a rude manner by staff member that had asked him to wait until she finished what she was doing for another patient before getting what he had requested.  Pt states "there is a WAY you speak to people, and that is not it".  Pt also became upset when he saw peer crying in dayroom and it was his perception that same staff member had made peer cry.  Writer explained to patient that his perception is not always accurate because often staff know situations about peers that he can not be made aware of.  Pt seemed to dismiss this explanation.  Pt did manage to become calm and go back to dayroom.  Pt denies SI/HI/AVH at this time.  Pt was positive for evening wrap up group, can be intrusive at times.  A.  Support and encouragement offered, medication given as ordered  R.  Pt remains safe on the unit, will continue to monitor.

## 2019-04-19 NOTE — Discharge Summary (Signed)
Physician Discharge Summary Note  Patient:  Fred Wilson is an 33 y.o., male  MRN:  [id number]  DOB:  [birth year]  Patient phone:  [phone] (home)   Patient address:   [address] [place] [address],   Total Time spent with patient: 15 minutes  Date of Admission:  (Not on file)  Date of Discharge: 04-19-19  Reason for Admission: Worsening depression after son's death..  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  Past Medical History:  Past Medical History:  Diagnosis Date  . ADHD (attention deficit hyperactivity disorder)   . Arthritis   . Asthma   . Bipolar 1 disorder ([place])   . Bronchitis   . Drug-seeking behavior   . GERD (gastroesophageal reflux disease)   . HIV (human immunodeficiency virus infection) ([place])   . Schizophrenia ([place])   . Seasonal allergies   . Substance abuse ([place])   . Suicide attempt [facility])     Past Surgical History:  Procedure Laterality Date  . FRACTURE SURGERY    . KNEE SURGERY     Family History: No family history on file. Family Psychiatric  History: See H&P Social History:  Social History   Substance and Sexual Activity  Alcohol Use No     Social History   Substance and Sexual Activity  Drug Use Yes  . Types: Marijuana, Cocaine    Social History   Socioeconomic History  . Marital status: Single    Spouse name: Not on file  . Number of children: 4  . Years of education: 93  . Highest education level: Not on file  Occupational History  . Not on file  Social Needs  . Financial resource strain: Not on file  . Food insecurity    Worry: Not on file    Inability: Not on file  . Transportation needs    Medical: Not on file    Non-medical: Not on file  Tobacco Use  . Smoking status: Current Every Day  Smoker    Packs/day: 1.00    Years: 5.00    Pack years: 5.00    Types: Cigarettes  . Smokeless tobacco: Never Used  Substance and Sexual Activity  . Alcohol use: No  . Drug use: Yes    Types: Marijuana, Cocaine  . Sexual activity: Yes    Birth control/protection: None  Lifestyle  . Physical activity    Days per week: Not on file    Minutes per session: Not on file  . Stress: Not on file  Relationships  . Social Herbalist on phone: Not on file    Gets together: Not on file    Attends religious service: Not on file    Active member of club or organization: Not on file    Attends meetings of clubs or organizations: Not on file    Relationship status: Not on file  Other Topics Concern  . Not on file  Social History Narrative   ** Merged History [facility] Course: Mr. Fred Wilson 'Fred Wilson' was admitted to the [facility] for mood stabilization treatments. He reported on admission day that he has just buried his son this past Tuesday. He stated that after spending so much money to plan his son's funeral & burial, he ran out of money &  was unable to pay his rent. As a result, got evicted from his apartment. He says he became depressed & came to the hospital for evaluation & treatment. And with his consent, he was started on Seroquel 50 mg Q bedtime for mood control & Depakote 500 mg po bid for mood stabilization. He was also restarted on his pertinent home medication for his other pre-existing medical issues that he presented. He was enrolled in the group counseling sessions being offered & held on this unit to help him learn coping skills to deal with his emotional issues after discharge.  It was mid morning today 04-19-19 when patient was observed sitting on the floor of his [address] room of the [address]. He reported that he slipped & fell. He complained of pain to his knee areas with intermittent screaming spells. He was assessed by the RN & assisted to a wheel chair with  cold compress application to his knee area. Patient did demand to be taken to the the ED right away as his pain was excruciating. He was then discharged to the [facility] ED for evaluation of his complaints & any possible injuries. Upon this discharge to the ED, Fred Wilson denies any SIHI, AVH, delusional thoughts or paranoia. He does not appear to be responding to any internal stimuli. Transportation by the EMS.  Physical Findings: AIMS:  , ,  ,  ,    CIWA:    COWS:     Musculoskeletal: Strength & Muscle Tone: within normal limits Gait & Station: normal Patient leans: N/A  Psychiatric Specialty Exam: Physical Exam  Vitals reviewed. Constitutional: He is oriented to person, place, and time. He appears well-developed.  Neck: Normal range of motion.  Cardiovascular: Normal rate.  Respiratory: No respiratory distress. He has no wheezes.  Genitourinary:    Genitourinary Comments: Deferred   Neurological: He is alert and oriented to person, place, and time.  Skin: Skin is warm and dry.    Review of Systems  Constitutional: Negative for chills and fever.  Respiratory: Negative for cough, shortness of breath and wheezing.   Cardiovascular: Negative for chest pain and palpitations.  Gastrointestinal: Negative for heartburn and nausea.  Musculoskeletal: Positive for joint pain and myalgias.  Neurological: Negative for dizziness and headaches.  Psychiatric/Behavioral: Positive for substance abuse (Hx. THC use disorder). Negative for depression, hallucinations, memory loss and suicidal ideas. The patient is not nervous/anxious and does not have insomnia.     There were no vitals taken for this visit.There is no height or weight on file to calculate BMI.  See Md's discharge SRA.   Has this patient used any form of tobacco in the last 30 days? (Cigarettes, Smokeless Tobacco, Cigars, and/or Pipes): N/A  Blood Alcohol level:  Lab Results  Component Value Date   ETH <10 04/16/2019    ETH <10 04/04/2019    Metabolic Disorder Labs:  No results found for: HGBA1C, MPG No results found for: PROLACTIN Lab Results  Component Value Date   CHOL 138 04/10/2019   TRIG 183 (H) 04/10/2019   HDL 49 04/10/2019   CHOLHDL 2.8 04/10/2019   VLDL 37 04/10/2019   LDLCALC 52 04/10/2019   LDLCALC 69 09/06/2017    See Psychiatric Specialty Exam and Suicide Risk Assessment completed by Attending Physician prior to discharge.  Discharge destination:  Other:  [facility] ED  Is patient on multiple antipsychotic therapies at discharge:  No   Has Patient had three or more failed trials of antipsychotic  monotherapy by history:  No  Recommended Plan for Multiple Antipsychotic Therapies: NA  Allergies as of 04/19/2019      Reactions   Bee Venom Anaphylaxis   Dilaudid [hydromorphone Hcl] Anaphylaxis, Hives, Itching   Morphine And Related Hives, Shortness Of Breath, Swelling   Olanzapine Hives   Tramadol Hives, Shortness Of Breath, Swelling   Aspirin Hives, Swelling   Hives and swollen glands   Ibuprofen Hives, Swelling   Hives and swollen glands   Morphine And Related Hives, Swelling   Hives and swollen glands   Toradol [ketorolac Tromethamine] Hives, Swelling   Glands swell   Tramadol Hives, Swelling   Hives and swollen glands      Medication List    TAKE these medications     Indication  Biktarvy 50-200-25 MG Tabs tablet Generic drug: bictegravir-emtricitabine-tenofovir AF Take 1 tablet by mouth daily.    divalproex 500 MG DR tablet Commonly known as: DEPAKOTE Take 500 mg by mouth 2 (two) times daily.  Indication: Mood stabilization   QUEtiapine 50 MG tablet Commonly known as: SEROQUEL Take 50 mg by mouth at bedtime.  Indication: Mood control      Follow-up recommendations: Per your outpatient medical provider.  Comments: Discharged [facility]  Signed: Armandina StammerAgnes Johnnathan Hagemeister, NP, PMHNP, FNP-BC. 04/19/2019, 3:56 PM

## 2019-04-19 NOTE — Progress Notes (Signed)
Patient observed this am intimidating and yelling at staff from previous shift . Patient was able to be redirected and calmed down a bit. Patient later apologized to this nurse for raising his voice

## 2019-04-19 NOTE — BHH Suicide Risk Assessment (Signed)
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   Blood pressure (!) 152/102, pulse 73, temperature 98.3 F (36.8 C), temperature source Oral, resp. rate 18, height 6' (1.829 m), weight 71.7 kg, SpO2 99 %.Body mass index is 21.43 kg/m.  General Appearance: Casual  Eye Contact::  Good  Speech:  Normal Rate409  Volume:  Increased  Mood:  Irritable  Affect:  Congruent  Thought Process:  Coherent and Descriptions of Associations: Intact  Orientation:  Full (Time, Place, and Person)  Thought Content:  Rumination  Suicidal Thoughts:  No  Homicidal Thoughts:  No  Memory:  Immediate;   Fair Recent;   Fair Remote;   Fair  Judgement:  Impaired  Insight:  Lacking  Psychomotor Activity:  Increased  Concentration:  Fair  Recall:  [employer] of Knowledge:Fair  Language: Good  Akathisia:  Negative  Handed:  Right  AIMS (if indicated):     Assets:  Desire for Improvement Resilience  Sleep:  Number of Hours: 5.75  Cognition: WNL  ADL's:  Intact   Mental Status Per Nursing Assessment::   On Admission:  NA  Demographic Factors:  Male, Divorced or widowed, Living alone and Unemployed  Loss Factors: Loss of significant relationship  Historical Factors: Impulsivity  Risk Reduction Factors:   Positive therapeutic relationship and NA  Continued Clinical Symptoms:  Depression:   Comorbid alcohol abuse/dependence Impulsivity Alcohol/Substance Abuse/Dependencies Personality Disorders:   Cluster B Unstable or Poor Therapeutic  Relationship  Cognitive Features That Contribute To Risk:  None    Suicide Risk:  Minimal: No identifiable suicidal ideation.  Patients presenting with no risk factors but with morbid ruminations; may be classified as minimal risk based on the severity of the depressive symptoms  Follow-up [facility] Follow up on 05/11/2019.   Why: Please attend your follow up appointment on Monday, 11/16 at 10:00a.  Be sure to bring your photo ID and current medications.  Contact information: [address] [place] [address] ph: [phone] fx: [phone]       [facility] Follow up.   Contact information: [place] [address] ph: [phone] fx: [phone]       [facility] Follow up.   Contact information: [address] [place] [address] [phone]           Plan Of Care/Follow-up recommendations:  Activity:  ad lib  Sharma Covert, MD 04/19/2019, 1:54 PM

## 2019-04-19 NOTE — Progress Notes (Signed)
Pt went to cafeteria this morning and began to yell and curse at Santiago Glad, [employer] lady about not being able to get more than two pieces of french toast.  Pt stated per Santiago Glad, "You will give me whatever I ask for"  Pt then proceeded to threaten staff and security by stating "you can google me and see what I have done".  Pt attempting to intimidate staff.  Several ;peers this morning remarked that they were uncomfortable with patient's behavior.  Pt presently on unit eating breakfast.

## 2019-04-19 NOTE — ED Notes (Signed)
Patient has been given Personal belongings from Adult [facility] unit. Patient has been instructed and informed that patient needs to dress and patient is discharged.

## 2019-04-19 NOTE — Progress Notes (Signed)
Non- emergent EMS here to transport patient to [facility] ED. Pt is stable, alert and oriented.

## 2019-04-19 NOTE — BHH Group Notes (Signed)
[facility] Group Notes: (Clinical Social Work)   04/19/2019      Type of Therapy:  Group Therapy   Participation Level:  Did Not Attend - was in the Emergency Trainer, [place] 04/19/2019, 1:18 PM

## 2019-04-20 ENCOUNTER — Other Ambulatory Visit: Payer: Self-pay

## 2019-04-20 ENCOUNTER — Emergency Department (HOSPITAL_COMMUNITY)
Admission: EM | Admit: 2019-04-20 | Discharge: 2019-04-22 | Disposition: A | Discharge status: Other Acute Inpatient Hospital | Payer: Medicaid Other | Attending: Emergency Medicine | Admitting: Emergency Medicine

## 2019-04-20 DIAGNOSIS — F209 Schizophrenia, unspecified: Secondary | ICD-10-CM | POA: Insufficient documentation

## 2019-04-20 DIAGNOSIS — Z008 Encounter for other general examination: Secondary | ICD-10-CM | POA: Insufficient documentation

## 2019-04-20 DIAGNOSIS — Z79899 Other long term (current) drug therapy: Secondary | ICD-10-CM | POA: Insufficient documentation

## 2019-04-20 DIAGNOSIS — F17210 Nicotine dependence, cigarettes, uncomplicated: Secondary | ICD-10-CM | POA: Insufficient documentation

## 2019-04-20 DIAGNOSIS — Z20828 Contact with and (suspected) exposure to other viral communicable diseases: Secondary | ICD-10-CM | POA: Insufficient documentation

## 2019-04-20 DIAGNOSIS — Z21 Asymptomatic human immunodeficiency virus [HIV] infection status: Secondary | ICD-10-CM | POA: Insufficient documentation

## 2019-04-20 DIAGNOSIS — R45851 Suicidal ideations: Secondary | ICD-10-CM

## 2019-04-20 DIAGNOSIS — J45909 Unspecified asthma, uncomplicated: Secondary | ICD-10-CM | POA: Insufficient documentation

## 2019-04-20 LAB — URINALYSIS, ROUTINE W REFLEX MICROSCOPIC
Bilirubin Urine: NEGATIVE
Glucose, UA: NEGATIVE mg/dL
Hgb urine dipstick: NEGATIVE
Ketones, ur: NEGATIVE mg/dL
Leukocytes,Ua: NEGATIVE
Nitrite: NEGATIVE
Protein, ur: NEGATIVE mg/dL
Specific Gravity, Urine: 1.023 (ref 1.005–1.030)
pH: 7 (ref 5.0–8.0)

## 2019-04-20 LAB — RAPID URINE DRUG SCREEN, HOSP PERFORMED
Amphetamines: NOT DETECTED
Barbiturates: NOT DETECTED
Benzodiazepines: NOT DETECTED
Cocaine: NOT DETECTED
Opiates: NOT DETECTED
Tetrahydrocannabinol: POSITIVE — AB

## 2019-04-20 MED ORDER — DIVALPROEX SODIUM 500 MG PO DR TAB
500.0000 mg | DELAYED_RELEASE_TABLET | Freq: Two times a day (BID) | ORAL | Status: DC
  Administered 2019-04-20 – 2019-04-22 (×4): 500 mg via ORAL
  Filled 2019-04-20 (×4): qty 1

## 2019-04-20 MED ORDER — BICTEGRAVIR-EMTRICITAB-TENOFOV 50-200-25 MG PO TABS
1.0000 | ORAL_TABLET | Freq: Every day | ORAL | Status: DC
  Administered 2019-04-20 – 2019-04-22 (×3): 1 via ORAL
  Filled 2019-04-20 (×3): qty 1

## 2019-04-20 MED ORDER — ACETAMINOPHEN 500 MG PO TABS
1000.0000 mg | ORAL_TABLET | Freq: Once | ORAL | Status: AC
  Administered 2019-04-20: 18:00:00 1000 mg via ORAL
  Filled 2019-04-20: qty 2

## 2019-04-20 MED ORDER — QUETIAPINE FUMARATE 50 MG PO TABS
50.0000 mg | ORAL_TABLET | Freq: Every day | ORAL | Status: DC
  Administered 2019-04-20 – 2019-04-21 (×2): 50 mg via ORAL
  Filled 2019-04-20 (×2): qty 1

## 2019-04-20 NOTE — BH Assessment (Signed)
BHH Assessment Progress Note  Case was staffed with Rankin NP who recommended patient be observed and monitored.      

## 2019-04-20 NOTE — ED Triage Notes (Signed)
Pt loss son last week.

## 2019-04-20 NOTE — BH Assessment (Signed)
Assessment Note  Fred Wilson is an 33 y.o. male that presents this date with IVC. Per IVC patient had a verbal altercation with a staff member at [facility] and put a plastic bag over his head in an attempt to self harm. Patient voices ongoing S/I at the time of assessment stating he is still upset over the incident. Patient was discharged from [facility] on 04/19/19 and went to [facility] seeking admission for continued MH issues. Patient has a history of HIV disease and bipolar disorder. He continues this date to be processing his issues associated with the recent death of his 56 year old son and states the incident at [facility] "made everything so much worse." He states he had a verbal altercation at [facility] today where he apparently had some sort of disagreement with the director there as he did not want to participate in group therapy. The police were called and he was discharged from there after putting a plastic bag over his head. IVC was initiated by [facility]. Patient reports he has had issues with depression for a long period of time.  He feels that no one cares about him and that "everything is lost."  Patient has a SA history to include: using alcohol and cannabis. Patient reports using various amounts of alcohol and cannabis prior to his admission to [facility] with last use sometime last week. Patient reports multiple attempts at self harm. Patient states he has been receiving medication management from [facility]. Patient has good eye contact. Patient was oriented x 4 and presents with appropriate affect. His vocal tone is within normal range. Patient appears to be depressed although does not appear to be responding to internal stimuli. Thought content is logical. Case was staffed with Rankin NP who recommended patient be observed and monitored.    Diagnosis: F20.9 Schizophrenia   Past Medical History:  Past Medical History:  Diagnosis Date  . ADHD (attention deficit hyperactivity disorder)   .  Arthritis   . Asthma   . Bipolar 1 disorder ([facility])   . Bronchitis   . Drug-seeking behavior   . GERD (gastroesophageal reflux disease)   . HIV (human immunodeficiency virus infection) ([facility])   . Schizophrenia ([facility])   . Seasonal allergies   . Substance abuse ([facility])   . Suicide attempt [facility])     Past Surgical History:  Procedure Laterality Date  . FRACTURE SURGERY    . KNEE SURGERY      Family History: No family history on file.  Social History:  reports that he has been smoking cigarettes. He has a 5.00 pack-year smoking history. He has never used smokeless tobacco. He reports current drug use. Drugs: Marijuana and Cocaine. He reports that he does not drink alcohol.  Additional Social History:  Alcohol / Drug Use Pain Medications: See MAR Prescriptions: See MAR Over the Counter: See MAR History of alcohol / drug use?: Yes Longest period of sobriety (when/how long): Unknown Negative Consequences of Use: (Denies) Withdrawal Symptoms: (Denies) Substance #1 Name of Substance 1: Alcohol per hx 1 - Age of First Use: 17 1 - Amount (size/oz): Varies 1 - Frequency: Varies 1 - Duration: Ongoing 1 - Last Use / Amount: Pt reports in the last week Substance #2 Name of Substance 2: Cannabis per hx 2 - Age of First Use: 16 2 - Amount (size/oz): Varies 2 - Frequency: Varies 2 - Duration: Ongoing 2 - Last Use / Amount: Pt reports in the last week  CIWA: CIWA-Ar BP: (!) 148/85 Pulse  Rate: 73 COWS:    Allergies:  Allergies  Allergen Reactions  . Aspirin Hives and Swelling    Hives and swollen glands  . Bee Venom Anaphylaxis  . Dilaudid [Hydromorphone Hcl] Anaphylaxis, Hives and Itching  . Ibuprofen Hives and Swelling    Hives and swollen glands  . Morphine And Related Hives, Shortness Of Breath and Swelling  . Olanzapine Hives  . Tramadol Hives, Shortness Of Breath and Swelling  . Morphine And Related Hives and Swelling    Hives and swollen glands  . Toradol [Ketorolac  Tromethamine] Hives and Swelling    Glands swell  . Tramadol Hives and Swelling    Hives and swollen glands    Home Medications: (Not in a hospital admission)   OB/GYN Status:  No LMP for male patient.  General Assessment Data [facility] of Assessment: [facility] TTS Assessment: In system Is this a Tele or Face-to-Face Assessment?: Face-to-Face Is this an Initial Assessment or a Re-assessment for this encounter?: Initial Assessment Patient Accompanied by:: N/A Language Other than English: No Living Arrangements: Homeless/Shelter What gender do you identify as?: Male Marital status: Single Pregnancy Status: No Living Arrangements: Other (Comment)(Homeless) Can pt return to current living arrangement?: Yes Admission Status: Involuntary Petitioner: Other([facility]) Is patient capable of signing voluntary admission?: Yes Referral Source: Other Insurance type: MCD     Crisis Care Plan Living Arrangements: Other (Comment)(Homeless) Legal Guardian: (Self) Name of Psychiatrist: [facility] Name of Therapist: None  Education Status Is patient currently in school?: No Is the patient employed, unemployed or receiving disability?: Unemployed  Risk to self with the past 6 months Suicidal Ideation: Yes-Currently Present Has patient been a risk to self within the past 6 months prior to admission? : Yes Suicidal Intent: Yes-Currently Present Has patient had any suicidal intent within the past 6 months prior to admission? : Yes Is patient at risk for suicide?: Yes Suicidal Plan?: Yes-Currently Present Has patient had any suicidal plan within the past 6 months prior to admission? : Yes Specify Current Suicidal Plan: Put a plastic bag over his head  Access to Means: Yes Specify Access to Suicidal Means: Pt had a bag  What has been your use of drugs/alcohol within the last 12 months?: Current use Previous Attempts/Gestures: Yes How many times?: 1 Other Self Harm Risks: (Hx of suicide attempts  ) Triggers for Past Attempts: Unknown Intentional Self Injurious Behavior: Cutting Comment - Self Injurious Behavior: Hx of cutting Family Suicide History: No Recent stressful life event(s): Financial Problems Persecutory voices/beliefs?: No Depression: Yes Depression Symptoms: Feeling worthless/self pity Substance abuse history and/or treatment for substance abuse?: Yes Suicide prevention information given to non-admitted patients: Not applicable  Risk to Others within the past 6 months Homicidal Ideation: No Does patient have any lifetime risk of violence toward others beyond the six months prior to admission? : No Thoughts of Harm to Others: No Current Homicidal Intent: No Current Homicidal Plan: No Access to Homicidal Means: No Identified Victim: NA History of harm to others?: No Assessment of Violence: None Noted Violent Behavior Description: NA Does patient have access to weapons?: No(Pt denies this date) Criminal Charges Pending?: No Does patient have a court date: No Is patient on probation?: No  Psychosis Hallucinations: None noted Delusions: None noted  Mental Status Report Appearance/Hygiene: In scrubs Eye Contact: Good Motor Activity: Freedom of movement Speech: Logical/coherent Level of Consciousness: Alert Mood: Anxious Affect: Appropriate to circumstance Anxiety Level: Moderate Thought Processes: Coherent, Relevant Judgement: Partial Orientation: Person, Place, Time  Obsessive Compulsive Thoughts/Behaviors: None  Cognitive Functioning Concentration: Normal Memory: Recent Intact, Remote Intact Is patient IDD: No Insight: Fair Impulse Control: Poor Appetite: Good Have you had any weight changes? : No Change Sleep: No Change Total Hours of Sleep: 7 Vegetative Symptoms: None  ADLScreening [facility] Assessment Services) Patient's cognitive ability adequate to safely complete daily activities?: Yes Patient able to express need for assistance with ADLs?:  Yes Independently performs ADLs?: Yes (appropriate for developmental age)  Prior Inpatient Therapy Prior Inpatient Therapy: Yes Prior Therapy Dates: 2020 Prior Therapy Facilty/Provider(s): [place], [facility] Reason for Treatment: MH issues  Prior Outpatient Therapy Prior Outpatient Therapy: Yes Prior Therapy Dates: Ongoing Prior Therapy Facilty/Provider(s): [facility] Reason for Treatment: Med mang Does patient have an ACCT team?: No Does patient have Intensive In-House Services?  : No Does patient have [facility] services? : Yes Does patient have P4CC services?: No  ADL Screening (condition at time of admission) Patient's cognitive ability adequate to safely complete daily activities?: Yes Is the patient deaf or have difficulty hearing?: No Does the patient have difficulty seeing, even when wearing glasses/contacts?: No Does the patient have difficulty concentrating, remembering, or making decisions?: No Patient able to express need for assistance with ADLs?: Yes Does the patient have difficulty dressing or bathing?: No Independently performs ADLs?: Yes (appropriate for developmental age) Does the patient have difficulty walking or climbing stairs?: No Weakness of Legs: None Weakness of Arms/Hands: None  Home Assistive Devices/Equipment Home Assistive Devices/Equipment: None  Therapy Consults (therapy consults require a physician order) PT Evaluation Needed: No OT Evalulation Needed: No SLP Evaluation Needed: No Abuse/Neglect Assessment (Assessment to be complete while patient is alone) Physical Abuse: Denies Verbal Abuse: Denies Sexual Abuse: Denies Exploitation of patient/patient's resources: Denies Self-Neglect: Denies Values / Beliefs Cultural Requests During Hospitalization: None Spiritual Requests During Hospitalization: None Consults Spiritual Care Consult Needed: No Social Work Consult Needed: No Regulatory affairs officer (For Healthcare) Does Patient Have a Medical Advance  Directive?: No Would patient like information on creating a medical advance directive?: No - Patient declined Nutrition Screen- [facility] Adult/[facility] Patient's home diet: Regular        Disposition: Case was staffed with Rankin NP who recommended patient be observed and monitored.    Disposition Initial Assessment Completed for this Encounter: Yes Disposition of Patient: (Observe and monitor )  On Site Evaluation by:   Reviewed with Physician:    Mamie Nick 04/20/2019 6:30 PM

## 2019-04-20 NOTE — ED Triage Notes (Signed)
04/20/19  1617  Patient was involved in an argument with Control and instrumentation engineer. She called police to bring patient her.
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
## 2019-04-21 ENCOUNTER — Encounter (HOSPITAL_COMMUNITY): Payer: Self-pay | Admitting: Registered Nurse

## 2019-04-21 LAB — SARS CORONAVIRUS 2 (TAT 6-24 HRS): SARS Coronavirus 2: NEGATIVE

## 2019-04-21 LAB — VALPROIC ACID LEVEL: Valproic Acid Lvl: 28 ug/mL — ABNORMAL LOW (ref 50.0–100.0)

## 2019-04-21 MED ORDER — ACETAMINOPHEN 500 MG PO TABS
1000.0000 mg | ORAL_TABLET | Freq: Four times a day (QID) | ORAL | Status: DC | PRN
  Administered 2019-04-21 (×2): 1000 mg via ORAL
  Filled 2019-04-21 (×2): qty 2

## 2019-04-21 NOTE — ED Notes (Signed)
Pt received awake & alert; he readily speaks with staff talking about events leading to this hospitalization and the loss of his son to cancer 2 weeks ago; he related suicidal thoughts, "come and go, but I've had a marvelous day"; he denies current plan/intent and verbally acknowledges to seek staff as needed and with any changes in status; he is aware of disposition plan; food/fluids offered and accepted; maintain 15 min safety rounds and hourly nurse rounding.
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
## 2019-04-21 NOTE — ED Notes (Signed)
[facility] called and message left regarding am transport disposition.

## 2019-04-21 NOTE — BH Assessment (Signed)
[facility] Assessment Progress Note  Per Neita Garnet, MD, this pt requires psychiatric hospitalization at this time.  Pt presents under IVC initiated by [facility] staff, which Dr Parke Poisson has upheld.  At 16:10 [place] calls from [facility] to report that pt has been tentatively accepted to their facility by Dr Darnelle Bos pending negative results from Covid-19 testing; results must be faxed to [phone].  Shuvon Rankin, FNP concurs with this decision.  Pt's nurse, Nena Jordan, has been notified, and agrees to call report to [phone] when the time comes.  Pt is to be transported via [facility]  [place] Coordinator [phone]

## 2019-04-22 NOTE — ED Notes (Signed)
Covid 19 test results faxed to [facility] who confirm that pt is accepted there pending the Covid 19 test results being faxed to them.

## 2019-04-22 NOTE — ED Notes (Signed)
[facility] called with approximate 30 minute pick up to transport to [facility].

## 2019-04-22 NOTE — ED Notes (Signed)
Rested approximately 6 hours tonight with no distress noted after taking HS medications without difficulty;  up to bathroom a couple of times with no concerns voiced and no disturbances in behavior observed; presently he remains in bed sleeping with no distress noted.

## 2019-04-22 NOTE — ED Notes (Signed)
Report given to Wallis Mart RN at [facility] as [facility] was picking pt up. This is the way that they insist on report.

## 2019-04-22 NOTE — ED Provider Notes (Signed)
Patient accepted to [facility] for mental health care.  Accepted by Dr. Rosana Hoes.  Transferred with [facility].   Lennice Sites, DO 04/22/19 [phone]

## 2019-04-22 NOTE — ED Notes (Signed)
Transported to [facility] by [facility]. All belongings given to [facility]. Pt calm and cooperative.

## 2019-04-23 ENCOUNTER — Encounter: Payer: Self-pay | Admitting: Internal Medicine

## 2019-04-30 ENCOUNTER — Emergency Department (HOSPITAL_COMMUNITY)
Admission: EM | Admit: 2019-04-30 | Discharge: 2019-04-30 | Disposition: A | Discharge status: Home / Self Care | Payer: Medicaid Other | Attending: Emergency Medicine | Admitting: Emergency Medicine

## 2019-04-30 ENCOUNTER — Other Ambulatory Visit: Payer: Self-pay

## 2019-04-30 ENCOUNTER — Encounter (HOSPITAL_COMMUNITY): Payer: Self-pay | Admitting: Emergency Medicine

## 2019-04-30 DIAGNOSIS — J45909 Unspecified asthma, uncomplicated: Secondary | ICD-10-CM | POA: Insufficient documentation

## 2019-04-30 DIAGNOSIS — F17210 Nicotine dependence, cigarettes, uncomplicated: Secondary | ICD-10-CM | POA: Insufficient documentation

## 2019-04-30 DIAGNOSIS — R519 Headache, unspecified: Secondary | ICD-10-CM | POA: Insufficient documentation

## 2019-04-30 DIAGNOSIS — Z21 Asymptomatic human immunodeficiency virus [HIV] infection status: Secondary | ICD-10-CM | POA: Insufficient documentation

## 2019-04-30 DIAGNOSIS — Z79899 Other long term (current) drug therapy: Secondary | ICD-10-CM | POA: Insufficient documentation

## 2019-04-30 LAB — CBC WITH DIFFERENTIAL/PLATELET
Abs Immature Granulocytes: 0.02 10*3/uL (ref 0.00–0.07)
Basophils Absolute: 0 10*3/uL (ref 0.0–0.1)
Basophils Relative: 0 %
Eosinophils Absolute: 0.2 10*3/uL (ref 0.0–0.5)
Eosinophils Relative: 2 %
HCT: 41.9 % (ref 39.0–52.0)
Hemoglobin: 14.1 g/dL (ref 13.0–17.0)
Immature Granulocytes: 0 %
Lymphocytes Relative: 20 %
Lymphs Abs: 1.8 10*3/uL (ref 0.7–4.0)
MCH: 31.5 pg (ref 26.0–34.0)
MCHC: 33.7 g/dL (ref 30.0–36.0)
MCV: 93.5 fL (ref 80.0–100.0)
Monocytes Absolute: 0.7 10*3/uL (ref 0.1–1.0)
Monocytes Relative: 7 %
Neutro Abs: 6.4 10*3/uL (ref 1.7–7.7)
Neutrophils Relative %: 71 %
Platelets: 245 10*3/uL (ref 150–400)
RBC: 4.48 MIL/uL (ref 4.22–5.81)
RDW: 13.4 % (ref 11.5–15.5)
WBC: 9.1 10*3/uL (ref 4.0–10.5)
nRBC: 0 % (ref 0.0–0.2)

## 2019-04-30 LAB — COMPREHENSIVE METABOLIC PANEL
ALT: 16 U/L (ref 0–44)
AST: 26 U/L (ref 15–41)
Albumin: 4.1 g/dL (ref 3.5–5.0)
Alkaline Phosphatase: 70 U/L (ref 38–126)
Anion gap: 12 (ref 5–15)
BUN: 12 mg/dL (ref 6–20)
CO2: 25 mmol/L (ref 22–32)
Calcium: 9 mg/dL (ref 8.9–10.3)
Chloride: 102 mmol/L (ref 98–111)
Creatinine, Ser: 1 mg/dL (ref 0.61–1.24)
GFR calc Af Amer: >60 mL/min (ref >60)
GFR calc non Af Amer: >60 mL/min (ref >60)
Glucose, Bld: 87 mg/dL (ref 70–99)
Potassium: 3.5 mmol/L (ref 3.5–5.1)
Sodium: 139 mmol/L (ref 135–145)
Total Bilirubin: 0.7 mg/dL (ref 0.3–1.2)
Total Protein: 7.7 g/dL (ref 6.5–8.1)

## 2019-04-30 MED ORDER — DEXAMETHASONE SODIUM PHOSPHATE 10 MG/ML IJ SOLN
10.0000 mg | Freq: Once | INTRAMUSCULAR | Status: AC
  Administered 2019-04-30: 10 mg via INTRAMUSCULAR
  Filled 2019-04-30: qty 1

## 2019-04-30 MED ORDER — DIPHENHYDRAMINE HCL 50 MG/ML IJ SOLN
12.5000 mg | Freq: Once | INTRAMUSCULAR | Status: AC
  Administered 2019-04-30: 12.5 mg via INTRAMUSCULAR
  Filled 2019-04-30: qty 1

## 2019-04-30 MED ORDER — PROCHLORPERAZINE MALEATE 5 MG PO TABS
5.0000 mg | ORAL_TABLET | Freq: Once | ORAL | Status: AC
  Administered 2019-04-30: 5 mg via ORAL
  Filled 2019-04-30: qty 1

## 2019-04-30 NOTE — ED Triage Notes (Signed)
Patient reports persistent migraine headache unrelieved by OTC pain medication  onset this week , alert and oriented , denies head injury ,no emesis or fever .

## 2019-04-30 NOTE — ED Notes (Signed)
Patient verbalizes understanding of discharge instructions. Opportunity for questioning and answers were provided. Armband removed by staff, pt discharged from ED home via POV.  

## 2019-04-30 NOTE — ED Notes (Signed)
Pt here for headaches that he has had for the past 4 days. Pt reports it is a throbbing pain. Pt reports tylenol is not working for him.

## 2019-04-30 NOTE — Discharge Instructions (Signed)
Make sure you are getting enough sleep and staying hydrated Continue Tylenol as needed for headache Please return if worsening
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
## 2019-05-05 ENCOUNTER — Other Ambulatory Visit: Payer: Self-pay

## 2019-05-05 ENCOUNTER — Encounter (HOSPITAL_COMMUNITY): Payer: Self-pay

## 2019-05-05 ENCOUNTER — Emergency Department (HOSPITAL_COMMUNITY)
Admission: EM | Admit: 2019-05-05 | Discharge: 2019-05-05 | Discharge status: Against Medical Advice | Payer: Self-pay | Attending: Emergency Medicine | Admitting: Emergency Medicine

## 2019-05-05 ENCOUNTER — Emergency Department (HOSPITAL_COMMUNITY): Payer: Self-pay

## 2019-05-05 DIAGNOSIS — Z5321 Procedure and treatment not carried out due to patient leaving prior to being seen by health care provider: Secondary | ICD-10-CM | POA: Insufficient documentation

## 2019-05-05 LAB — BASIC METABOLIC PANEL
Anion gap: 10 (ref 5–15)
BUN: 10 mg/dL (ref 6–20)
CO2: 22 mmol/L (ref 22–32)
Calcium: 9.1 mg/dL (ref 8.9–10.3)
Chloride: 106 mmol/L (ref 98–111)
Creatinine, Ser: 0.84 mg/dL (ref 0.61–1.24)
GFR calc Af Amer: >60 mL/min (ref >60)
GFR calc non Af Amer: >60 mL/min (ref >60)
Glucose, Bld: 99 mg/dL (ref 70–99)
Potassium: 3.7 mmol/L (ref 3.5–5.1)
Sodium: 138 mmol/L (ref 135–145)

## 2019-05-05 LAB — CBC
HCT: 40.8 % (ref 39.0–52.0)
Hemoglobin: 13.4 g/dL (ref 13.0–17.0)
MCH: 31.2 pg (ref 26.0–34.0)
MCHC: 32.8 g/dL (ref 30.0–36.0)
MCV: 94.9 fL (ref 80.0–100.0)
Platelets: 255 10*3/uL (ref 150–400)
RBC: 4.3 MIL/uL (ref 4.22–5.81)
RDW: 13.7 % (ref 11.5–15.5)
WBC: 9.2 10*3/uL (ref 4.0–10.5)
nRBC: 0 % (ref 0.0–0.2)

## 2019-05-05 LAB — TROPONIN I (HIGH SENSITIVITY)
Troponin I (High Sensitivity): 3 ng/L (ref <18)
Troponin I (High Sensitivity): 4 ng/L (ref <18)

## 2019-05-05 MED ORDER — SODIUM CHLORIDE 0.9% FLUSH: 3.0000 mL | Freq: Once | INTRAVENOUS | Status: DC

## 2019-05-05 NOTE — ED Notes (Signed)
Pt LWBS pt called multiple times for treatment room no answer triage RN aware

## 2019-05-05 NOTE — ED Triage Notes (Signed)
Pt reports right sided ribcage pain, describes as a tightness for the past 3 days. Pt a.o, nad noted

## 2019-05-05 NOTE — ED Notes (Signed)
Family at bedside. 

## 2019-05-08 ENCOUNTER — Other Ambulatory Visit: Payer: Self-pay

## 2019-05-08 ENCOUNTER — Emergency Department (HOSPITAL_COMMUNITY)
Admission: EM | Admit: 2019-05-08 | Discharge: 2019-05-08 | Disposition: A | Discharge status: Home / Self Care | Payer: Medicaid Other | Attending: Emergency Medicine | Admitting: Emergency Medicine

## 2019-05-08 ENCOUNTER — Encounter (HOSPITAL_COMMUNITY): Payer: Self-pay | Admitting: Emergency Medicine

## 2019-05-08 DIAGNOSIS — Z5321 Procedure and treatment not carried out due to patient leaving prior to being seen by health care provider: Secondary | ICD-10-CM | POA: Insufficient documentation

## 2019-05-08 DIAGNOSIS — R109 Unspecified abdominal pain: Secondary | ICD-10-CM | POA: Insufficient documentation

## 2019-05-08 LAB — URINALYSIS, ROUTINE W REFLEX MICROSCOPIC
Bilirubin Urine: NEGATIVE
Glucose, UA: NEGATIVE mg/dL
Hgb urine dipstick: NEGATIVE
Ketones, ur: NEGATIVE mg/dL
Leukocytes,Ua: NEGATIVE
Nitrite: NEGATIVE
Protein, ur: NEGATIVE mg/dL
Specific Gravity, Urine: 1.023 (ref 1.005–1.030)
pH: 6 (ref 5.0–8.0)

## 2019-05-08 LAB — COMPREHENSIVE METABOLIC PANEL
ALT: 12 U/L (ref 0–44)
AST: 16 U/L (ref 15–41)
Albumin: 4 g/dL (ref 3.5–5.0)
Alkaline Phosphatase: 66 U/L (ref 38–126)
Anion gap: 11 (ref 5–15)
BUN: 19 mg/dL (ref 6–20)
CO2: 23 mmol/L (ref 22–32)
Calcium: 9.3 mg/dL (ref 8.9–10.3)
Chloride: 102 mmol/L (ref 98–111)
Creatinine, Ser: 1.18 mg/dL (ref 0.61–1.24)
GFR calc Af Amer: >60 mL/min (ref >60)
GFR calc non Af Amer: >60 mL/min (ref >60)
Glucose, Bld: 98 mg/dL (ref 70–99)
Potassium: 3.9 mmol/L (ref 3.5–5.1)
Sodium: 136 mmol/L (ref 135–145)
Total Bilirubin: 0.6 mg/dL (ref 0.3–1.2)
Total Protein: 7.7 g/dL (ref 6.5–8.1)

## 2019-05-08 LAB — CBC
HCT: 43.5 % (ref 39.0–52.0)
Hemoglobin: 14.3 g/dL (ref 13.0–17.0)
MCH: 31 pg (ref 26.0–34.0)
MCHC: 32.9 g/dL (ref 30.0–36.0)
MCV: 94.2 fL (ref 80.0–100.0)
Platelets: 259 10*3/uL (ref 150–400)
RBC: 4.62 MIL/uL (ref 4.22–5.81)
RDW: 13.8 % (ref 11.5–15.5)
WBC: 13.8 10*3/uL — ABNORMAL HIGH (ref 4.0–10.5)
nRBC: 0 % (ref 0.0–0.2)

## 2019-05-08 NOTE — ED Triage Notes (Signed)
Patient here with bilateral flank pain.  He states that is moves from side to side.  Patient denies any nausea or vomiting at this time.  He has been taking APAP with no relief.

## 2019-05-08 NOTE — ED Notes (Signed)
Called X3 for VS. No answer. 

## 2019-05-11 ENCOUNTER — Encounter: Payer: Medicaid Other | Admitting: Internal Medicine

## 2019-05-12 ENCOUNTER — Encounter (HOSPITAL_COMMUNITY): Payer: Self-pay | Admitting: Emergency Medicine

## 2019-05-12 ENCOUNTER — Emergency Department (HOSPITAL_COMMUNITY): Payer: Self-pay

## 2019-05-12 ENCOUNTER — Emergency Department (HOSPITAL_COMMUNITY)
Admission: EM | Admit: 2019-05-12 | Discharge: 2019-05-12 | Disposition: A | Discharge status: Home / Self Care | Payer: Self-pay | Attending: Emergency Medicine | Admitting: Emergency Medicine

## 2019-05-12 ENCOUNTER — Other Ambulatory Visit: Payer: Self-pay

## 2019-05-12 DIAGNOSIS — Z5321 Procedure and treatment not carried out due to patient leaving prior to being seen by health care provider: Secondary | ICD-10-CM | POA: Insufficient documentation

## 2019-05-12 NOTE — ED Triage Notes (Signed)
Pt c/o right shoulder pain after a fall at work x 3 days ago.

## 2019-05-12 NOTE — ED Notes (Signed)
Pt left without being seen.

## 2019-05-13 ENCOUNTER — Ambulatory Visit: Payer: Medicaid Other | Admitting: Internal Medicine

## 2019-05-13 ENCOUNTER — Telehealth: Payer: Self-pay

## 2019-05-13 NOTE — Telephone Encounter (Signed)
Patient called to missing appointment today. Patient was very apologetic that he forgot about his appointment today with Dr. Linus Salmons.  LPN sent link to activate MyChart for appointment reminders. Patient rescheduled. Patient appreciative of phone call.  Fred Wilson

## 2019-05-14 ENCOUNTER — Ambulatory Visit: Payer: Self-pay | Admitting: Internal Medicine

## 2019-05-15 ENCOUNTER — Emergency Department (HOSPITAL_COMMUNITY)
Admission: EM | Admit: 2019-05-15 | Discharge: 2019-05-15 | Disposition: A | Discharge status: Home / Self Care | Payer: Self-pay | Attending: Emergency Medicine | Admitting: Emergency Medicine

## 2019-05-15 ENCOUNTER — Other Ambulatory Visit: Payer: Self-pay

## 2019-05-15 ENCOUNTER — Encounter (HOSPITAL_COMMUNITY): Payer: Self-pay | Admitting: *Deleted

## 2019-05-15 DIAGNOSIS — Z79899 Other long term (current) drug therapy: Secondary | ICD-10-CM | POA: Insufficient documentation

## 2019-05-15 DIAGNOSIS — F17210 Nicotine dependence, cigarettes, uncomplicated: Secondary | ICD-10-CM | POA: Insufficient documentation

## 2019-05-15 DIAGNOSIS — M25511 Pain in right shoulder: Secondary | ICD-10-CM | POA: Insufficient documentation

## 2019-05-15 DIAGNOSIS — F1410 Cocaine abuse, uncomplicated: Secondary | ICD-10-CM | POA: Insufficient documentation

## 2019-05-15 DIAGNOSIS — M25512 Pain in left shoulder: Secondary | ICD-10-CM | POA: Insufficient documentation

## 2019-05-15 DIAGNOSIS — F1210 Cannabis abuse, uncomplicated: Secondary | ICD-10-CM | POA: Insufficient documentation

## 2019-05-15 DIAGNOSIS — B20 Human immunodeficiency virus [HIV] disease: Secondary | ICD-10-CM | POA: Insufficient documentation

## 2019-05-15 DIAGNOSIS — J45909 Unspecified asthma, uncomplicated: Secondary | ICD-10-CM | POA: Insufficient documentation

## 2019-05-15 MED ORDER — CYCLOBENZAPRINE HCL 10 MG PO TABS: 10.0000 mg | ORAL_TABLET | Freq: Two times a day (BID) | ORAL | 0 refills | Status: AC | PRN

## 2019-05-15 NOTE — ED Triage Notes (Signed)
Pt c/o R shoulder pain x 3 days. Denies injury
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
## 2019-05-15 NOTE — ED Notes (Signed)
Patient verbalizes understanding of discharge instructions. Opportunity for questioning and answers were provided. Armband removed by staff, pt discharged from ED. Pt. ambulatory and discharged home.  

## 2019-05-15 NOTE — ED Notes (Addendum)
Pt hurt right shoulder from moving furniture around 4 days ago. Been taking tylenol for pain management. Last tylenol was 05/14/2019 at 1400.

## 2019-05-27 ENCOUNTER — Encounter (HOSPITAL_COMMUNITY): Payer: Self-pay | Admitting: Emergency Medicine

## 2019-05-27 ENCOUNTER — Emergency Department (HOSPITAL_COMMUNITY)
Admission: EM | Admit: 2019-05-27 | Discharge: 2019-05-27 | Disposition: A | Discharge status: Home / Self Care | Payer: Self-pay | Attending: Emergency Medicine | Admitting: Emergency Medicine

## 2019-05-27 ENCOUNTER — Other Ambulatory Visit: Payer: Self-pay

## 2019-05-27 ENCOUNTER — Emergency Department (HOSPITAL_COMMUNITY): Admission: EM | Admit: 2019-05-27 | Discharge: 2019-05-27 | Discharge status: Against Medical Advice | Payer: Self-pay

## 2019-05-27 DIAGNOSIS — F319 Bipolar disorder, unspecified: Secondary | ICD-10-CM | POA: Insufficient documentation

## 2019-05-27 DIAGNOSIS — Z79899 Other long term (current) drug therapy: Secondary | ICD-10-CM | POA: Insufficient documentation

## 2019-05-27 DIAGNOSIS — R109 Unspecified abdominal pain: Secondary | ICD-10-CM

## 2019-05-27 DIAGNOSIS — B20 Human immunodeficiency virus [HIV] disease: Secondary | ICD-10-CM | POA: Insufficient documentation

## 2019-05-27 DIAGNOSIS — R1031 Right lower quadrant pain: Secondary | ICD-10-CM | POA: Insufficient documentation

## 2019-05-27 DIAGNOSIS — F909 Attention-deficit hyperactivity disorder, unspecified type: Secondary | ICD-10-CM | POA: Insufficient documentation

## 2019-05-27 DIAGNOSIS — F17210 Nicotine dependence, cigarettes, uncomplicated: Secondary | ICD-10-CM | POA: Insufficient documentation

## 2019-05-27 LAB — URINALYSIS, ROUTINE W REFLEX MICROSCOPIC
Bilirubin Urine: NEGATIVE
Glucose, UA: NEGATIVE mg/dL
Hgb urine dipstick: NEGATIVE
Ketones, ur: NEGATIVE mg/dL
Leukocytes,Ua: NEGATIVE
Nitrite: NEGATIVE
Protein, ur: NEGATIVE mg/dL
Specific Gravity, Urine: 1.026 (ref 1.005–1.030)
pH: 5 (ref 5.0–8.0)

## 2019-05-27 LAB — COMPREHENSIVE METABOLIC PANEL
ALT: 14 U/L (ref 0–44)
AST: 25 U/L (ref 15–41)
Albumin: 3.8 g/dL (ref 3.5–5.0)
Alkaline Phosphatase: 67 U/L (ref 38–126)
Anion gap: 8 (ref 5–15)
BUN: 13 mg/dL (ref 6–20)
CO2: 24 mmol/L (ref 22–32)
Calcium: 9.1 mg/dL (ref 8.9–10.3)
Chloride: 106 mmol/L (ref 98–111)
Creatinine, Ser: 1.07 mg/dL (ref 0.61–1.24)
GFR calc Af Amer: >60 mL/min (ref >60)
GFR calc non Af Amer: >60 mL/min (ref >60)
Glucose, Bld: 82 mg/dL (ref 70–99)
Potassium: 4.1 mmol/L (ref 3.5–5.1)
Sodium: 138 mmol/L (ref 135–145)
Total Bilirubin: 0.4 mg/dL (ref 0.3–1.2)
Total Protein: 7.4 g/dL (ref 6.5–8.1)

## 2019-05-27 LAB — CBC WITH DIFFERENTIAL/PLATELET
Abs Immature Granulocytes: 0.03 10*3/uL (ref 0.00–0.07)
Basophils Absolute: 0 10*3/uL (ref 0.0–0.1)
Basophils Relative: 0 %
Eosinophils Absolute: 0.2 10*3/uL (ref 0.0–0.5)
Eosinophils Relative: 2 %
HCT: 42.7 % (ref 39.0–52.0)
Hemoglobin: 14.2 g/dL (ref 13.0–17.0)
Immature Granulocytes: 0 %
Lymphocytes Relative: 17 %
Lymphs Abs: 1.3 10*3/uL (ref 0.7–4.0)
MCH: 31.6 pg (ref 26.0–34.0)
MCHC: 33.3 g/dL (ref 30.0–36.0)
MCV: 95.1 fL (ref 80.0–100.0)
Monocytes Absolute: 0.5 10*3/uL (ref 0.1–1.0)
Monocytes Relative: 7 %
Neutro Abs: 5.8 10*3/uL (ref 1.7–7.7)
Neutrophils Relative %: 74 %
Platelets: 253 10*3/uL (ref 150–400)
RBC: 4.49 MIL/uL (ref 4.22–5.81)
RDW: 13.2 % (ref 11.5–15.5)
WBC: 7.8 10*3/uL (ref 4.0–10.5)
nRBC: 0 % (ref 0.0–0.2)

## 2019-05-27 MED ORDER — ACETAMINOPHEN 500 MG PO TABS
1000.0000 mg | ORAL_TABLET | Freq: Once | ORAL | Status: AC
  Administered 2019-05-27: 1000 mg via ORAL
  Filled 2019-05-27: qty 2

## 2019-05-27 MED ORDER — LIDOCAINE 5 % EX PTCH
1.0000 | MEDICATED_PATCH | Freq: Once | CUTANEOUS | Status: DC
  Filled 2019-05-27: qty 3
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
## 2019-05-27 NOTE — ED Triage Notes (Signed)
Pt reports bilateral flank pain X3 days.  Pt was here earlier but left.

## 2019-05-27 NOTE — Discharge Instructions (Addendum)
Your pain is likely musculoskeletal.  We recommend that you continue to use Tylenol.  We will also prescribe you lidocaine patches for pain.  Please come back to the emergency department if you develop infectious symptoms such as fevers or chills, difficulty urinating, blood in your urine, worsening pain, nausea, vomiting or any other worrisome symptom.  You may take Tylenol and use over-the-counter topical lidocaine as needed for pain.

## 2019-06-01 ENCOUNTER — Encounter (HOSPITAL_COMMUNITY): Payer: Self-pay | Admitting: Emergency Medicine

## 2019-06-01 ENCOUNTER — Encounter: Payer: Self-pay | Admitting: Internal Medicine

## 2019-06-01 ENCOUNTER — Other Ambulatory Visit: Payer: Self-pay

## 2019-06-01 ENCOUNTER — Emergency Department (HOSPITAL_COMMUNITY): Payer: Self-pay

## 2019-06-01 ENCOUNTER — Emergency Department (HOSPITAL_COMMUNITY)
Admission: EM | Admit: 2019-06-01 | Discharge: 2019-06-01 | Disposition: A | Discharge status: Home / Self Care | Payer: Self-pay | Attending: Emergency Medicine | Admitting: Emergency Medicine

## 2019-06-01 DIAGNOSIS — Z21 Asymptomatic human immunodeficiency virus [HIV] infection status: Secondary | ICD-10-CM | POA: Insufficient documentation

## 2019-06-01 DIAGNOSIS — Z79899 Other long term (current) drug therapy: Secondary | ICD-10-CM | POA: Insufficient documentation

## 2019-06-01 DIAGNOSIS — F17210 Nicotine dependence, cigarettes, uncomplicated: Secondary | ICD-10-CM | POA: Insufficient documentation

## 2019-06-01 DIAGNOSIS — J45909 Unspecified asthma, uncomplicated: Secondary | ICD-10-CM | POA: Insufficient documentation

## 2019-06-01 DIAGNOSIS — R0789 Other chest pain: Secondary | ICD-10-CM | POA: Insufficient documentation

## 2019-06-01 LAB — CBC
HCT: 41.6 % (ref 39.0–52.0)
Hemoglobin: 13.9 g/dL (ref 13.0–17.0)
MCH: 31.5 pg (ref 26.0–34.0)
MCHC: 33.4 g/dL (ref 30.0–36.0)
MCV: 94.3 fL (ref 80.0–100.0)
Platelets: 262 10*3/uL (ref 150–400)
RBC: 4.41 MIL/uL (ref 4.22–5.81)
RDW: 13.7 % (ref 11.5–15.5)
WBC: 9.6 10*3/uL (ref 4.0–10.5)
nRBC: 0 % (ref 0.0–0.2)

## 2019-06-01 LAB — BASIC METABOLIC PANEL
Anion gap: 11 (ref 5–15)
BUN: 7 mg/dL (ref 6–20)
CO2: 24 mmol/L (ref 22–32)
Calcium: 9.4 mg/dL (ref 8.9–10.3)
Chloride: 104 mmol/L (ref 98–111)
Creatinine, Ser: 1.02 mg/dL (ref 0.61–1.24)
GFR calc Af Amer: >60 mL/min (ref >60)
GFR calc non Af Amer: >60 mL/min (ref >60)
Glucose, Bld: 117 mg/dL — ABNORMAL HIGH (ref 70–99)
Potassium: 3.7 mmol/L (ref 3.5–5.1)
Sodium: 139 mmol/L (ref 135–145)

## 2019-06-01 LAB — TROPONIN I (HIGH SENSITIVITY): Troponin I (High Sensitivity): 5 ng/L (ref <18)

## 2019-06-01 MED ORDER — ACETAMINOPHEN 325 MG PO TABS
650.0000 mg | ORAL_TABLET | Freq: Once | ORAL | Status: AC
  Administered 2019-06-01: 650 mg via ORAL
  Filled 2019-06-01: qty 2

## 2019-06-01 MED ORDER — SODIUM CHLORIDE 0.9% FLUSH: 3.0000 mL | Freq: Once | INTRAVENOUS | Status: DC

## 2019-06-01 MED ORDER — LIDOCAINE 5 % EX PTCH: 1.0000 | MEDICATED_PATCH | CUTANEOUS | 0 refills | Status: AC

## 2019-06-01 MED ORDER — LIDOCAINE 5 % EX PTCH
1.0000 | MEDICATED_PATCH | CUTANEOUS | Status: DC
  Administered 2019-06-01: 1 via TRANSDERMAL
  Filled 2019-06-01: qty 1

## 2019-06-01 NOTE — ED Triage Notes (Signed)
Patient reports right chest pain for >1 week with mild SOB , no emesis or diaphoresis .
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
## 2019-06-01 NOTE — ED Notes (Addendum)
Patient strongly refused IV start despite RN's recommendation for chest pain care .

## 2019-06-01 NOTE — Discharge Instructions (Addendum)
Thank you for allowing me to care for you today in the Emergency Department.   Call the number on your discharge paperwork to get established with a primary care provider.  Please follow-up with primary care if your chest pain persists.  You can take 1000 g of Tylenol for pain once every 8 hours.  You can also apply Lidoderm patch to areas that are sore once every 24 hours.  Return to the emergency department if you develop respiratory distress, if you pass out, new numbness or weakness, significant swelling in her legs, high fevers or chest pain, or other new, concerning symptoms.

## 2019-06-01 NOTE — Progress Notes (Unsigned)
Patient ID: Fred Wilson, male   DOB: 1986-04-22, 33 y.o.   MRN: [id number] Called patient to schedule follow up  Left message to call for appointment    Bing Quarry

## 2019-06-16 ENCOUNTER — Other Ambulatory Visit: Payer: Self-pay

## 2019-06-16 ENCOUNTER — Emergency Department (HOSPITAL_COMMUNITY): Payer: Self-pay

## 2019-06-16 ENCOUNTER — Emergency Department (HOSPITAL_COMMUNITY)
Admission: EM | Admit: 2019-06-16 | Discharge: 2019-06-16 | Disposition: A | Discharge status: Home / Self Care | Payer: Self-pay | Attending: Emergency Medicine | Admitting: Emergency Medicine

## 2019-06-16 ENCOUNTER — Encounter (HOSPITAL_COMMUNITY): Payer: Self-pay | Admitting: Emergency Medicine

## 2019-06-16 DIAGNOSIS — F17210 Nicotine dependence, cigarettes, uncomplicated: Secondary | ICD-10-CM | POA: Insufficient documentation

## 2019-06-16 DIAGNOSIS — J45909 Unspecified asthma, uncomplicated: Secondary | ICD-10-CM | POA: Insufficient documentation

## 2019-06-16 DIAGNOSIS — M25532 Pain in left wrist: Secondary | ICD-10-CM | POA: Insufficient documentation
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
## 2019-06-16 NOTE — ED Notes (Signed)
Pt discharge instructions reviewed with the patient. The patient verbalized understanding of instructions. Pt discharged. 

## 2019-06-16 NOTE — Discharge Instructions (Addendum)
Wear the splint as needed.   Apply ice 3-4 times a day, as needed.  Take acetaminophen as needed for pain.

## 2019-06-16 NOTE — ED Triage Notes (Signed)
Patient reports left wrist pain for several days , denies injury / no deformity or swelling .

## 2019-06-30 ENCOUNTER — Encounter: Payer: Self-pay | Admitting: Internal Medicine

## 2019-06-30 NOTE — Progress Notes (Signed)
Patient ID: Fred Wilson, male   DOB: 01-Oct-1985, 34 y.o.   MRN: [id number] Working Viral Load list called patient left message to call for appointment

## 2019-09-08 ENCOUNTER — Other Ambulatory Visit: Payer: Self-pay | Admitting: *Deleted

## 2019-09-08 ENCOUNTER — Ambulatory Visit: Payer: Medicaid Other

## 2019-09-08 ENCOUNTER — Other Ambulatory Visit: Payer: Medicaid Other

## 2019-09-08 DIAGNOSIS — Z79899 Other long term (current) drug therapy: Secondary | ICD-10-CM

## 2019-09-08 DIAGNOSIS — B20 Human immunodeficiency virus [HIV] disease: Secondary | ICD-10-CM

## 2019-09-08 DIAGNOSIS — Z113 Encounter for screening for infections with a predominantly sexual mode of transmission: Secondary | ICD-10-CM

## 2019-09-28 ENCOUNTER — Telehealth: Payer: Self-pay | Admitting: Pharmacy Technician

## 2019-09-28 ENCOUNTER — Encounter: Payer: Medicaid Other | Admitting: Internal Medicine

## 2019-10-07 NOTE — Telephone Encounter (Signed)
error 

## 2019-10-29 ENCOUNTER — Encounter (HOSPITAL_COMMUNITY): Payer: Self-pay | Admitting: Emergency Medicine

## 2019-10-29 ENCOUNTER — Emergency Department (HOSPITAL_COMMUNITY)
Admission: EM | Admit: 2019-10-29 | Discharge: 2019-10-29 | Disposition: A | Discharge status: Home / Self Care | Payer: Medicaid Other | Attending: Emergency Medicine | Admitting: Emergency Medicine

## 2019-10-29 ENCOUNTER — Other Ambulatory Visit: Payer: Self-pay

## 2019-10-29 DIAGNOSIS — Z79899 Other long term (current) drug therapy: Secondary | ICD-10-CM | POA: Insufficient documentation

## 2019-10-29 DIAGNOSIS — F1410 Cocaine abuse, uncomplicated: Secondary | ICD-10-CM | POA: Insufficient documentation

## 2019-10-29 DIAGNOSIS — R519 Headache, unspecified: Secondary | ICD-10-CM | POA: Insufficient documentation

## 2019-10-29 DIAGNOSIS — B20 Human immunodeficiency virus [HIV] disease: Secondary | ICD-10-CM | POA: Insufficient documentation

## 2019-10-29 DIAGNOSIS — F1210 Cannabis abuse, uncomplicated: Secondary | ICD-10-CM | POA: Insufficient documentation

## 2019-10-29 DIAGNOSIS — F17210 Nicotine dependence, cigarettes, uncomplicated: Secondary | ICD-10-CM | POA: Insufficient documentation

## 2019-10-29 MED ORDER — METOCLOPRAMIDE HCL 5 MG/ML IJ SOLN
10.0000 mg | Freq: Once | INTRAMUSCULAR | Status: AC
  Administered 2019-10-29: 10 mg via INTRAMUSCULAR
  Filled 2019-10-29: qty 2

## 2019-10-29 MED ORDER — DIPHENHYDRAMINE HCL 50 MG/ML IJ SOLN
25.0000 mg | Freq: Once | INTRAMUSCULAR | Status: AC
  Administered 2019-10-29: 25 mg via INTRAMUSCULAR
  Filled 2019-10-29: qty 1

## 2019-10-29 NOTE — Discharge Instructions (Signed)
Please read and follow all provided instructions.  Your diagnoses today include:  1. Acute nonintractable headache, unspecified headache type     Tests performed today include:  Vital signs. See below for your results today.   Medications:  In the Emergency Department you received:  Reglan - antinausea/headache medication  Benadryl - antihistamine to counteract potential side effects of reglan  Take any prescribed medications only as directed.  Additional information:  Follow any educational materials contained in this packet.  You are having a headache. No specific cause was found today for your headache. It may have been a migraine or other cause of headache. Stress, anxiety, fatigue, and depression are common triggers for headaches.   Your headache today does not appear to be life-threatening or require hospitalization, but often the exact cause of headaches is not determined in the emergency department. Therefore, follow-up with your doctor is very important to find out what may have caused your headache and whether or not you need any further diagnostic testing or treatment.   Sometimes headaches can appear benign (not harmful), but then more serious symptoms can develop which should prompt an immediate re-evaluation by your doctor or the emergency department.  BE VERY CAREFUL not to take multiple medicines containing Tylenol (also called acetaminophen). Doing so can lead to an overdose which can damage your liver and cause liver failure and possibly death.   Follow-up instructions: Please follow-up with your primary care provider in the next 3 days for further evaluation of your symptoms.   Return instructions:   Please return to the Emergency Department if you experience worsening symptoms.  Return if the medications do not resolve your headache, if it recurs, or if you have multiple episodes of vomiting or cannot keep down fluids.  Return if you have a change from the  usual headache.  RETURN IMMEDIATELY IF you:  Develop a sudden, severe headache  Develop confusion or become poorly responsive or faint  Develop a fever above 100.72F or problem breathing  Have a change in speech, vision, swallowing, or understanding  Develop new weakness, numbness, tingling, incoordination in your arms or legs  Have a seizure  Please return if you have any other emergent concerns.  Additional Information:  Your vital signs today were: BP 140/80   Pulse 69   Temp 98.6 F (37 C) (Oral)   Resp 17   Ht 6\' 1"  (1.854 m)   Wt 79.4 kg   SpO2 98%   BMI 23.09 kg/m  If your blood pressure (BP) was elevated above 135/85 this visit, please have this repeated by your doctor within one month. --------------

## 2019-10-29 NOTE — ED Notes (Signed)
Patient Alert and oriented to baseline. Stable and ambulatory to baseline. Patient verbalized understanding of the discharge instructions.  Patient belongings were taken by the patient.   

## 2019-10-29 NOTE — ED Triage Notes (Signed)
Pt reports headaches for "the last couple of days"  Denies n/v/d.  No neuro symptoms noted
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
## 2019-10-29 NOTE — ED Notes (Signed)
Denies any other Covid like x or known exposure. No N/V/D or ision changes.  States HA x 3 days, new onset of sx.  Pt alert and restful without acute distress.

## 2019-10-29 NOTE — ED Notes (Signed)
Pt sleeping sound, but when wakened states his pain is a 6.5.   No other changes noted.

## 2019-12-02 ENCOUNTER — Encounter: Payer: Self-pay | Admitting: Internal Medicine

## 2019-12-02 NOTE — Progress Notes (Signed)
Patient ID: Fred Wilson, male   DOB: May 30, 1986, 34 y.o.   MRN: [id number] Working Viral Load List  Called patient Fred Wilson he advised he is no longer in [place] he has moved to [place] and receiving care there

## 2019-12-09 ENCOUNTER — Telehealth: Payer: Self-pay

## 2019-12-09 NOTE — Telephone Encounter (Signed)
Received fax from [facility] requesting last notes to continue patient care. Last seen in office in 2019 with FNP. Will forward last note and labs.  P:[phone] F: [phone] Lorenso Courier, CMA

## 2020-01-25 ENCOUNTER — Encounter: Payer: Self-pay | Admitting: Internal Medicine

## 2020-01-25 NOTE — Progress Notes (Signed)
Patient ID: Fred Wilson, male   DOB: [birth year], 34 y.o.   MRN: [id number] Fred Wilson to schedule he advised he no longer lived in [place] that he would be receiving his care in [place]

## 2020-03-15 IMAGING — CR DG FOOT COMPLETE 3+V*R*
3 series · 3 of 3 positions shown · non-contrast
Comparison: None.

CLINICAL DATA: Dorsal sided foot pain

EXAM:
RIGHT FOOT COMPLETE - 3+ VIEW

[foot ap]
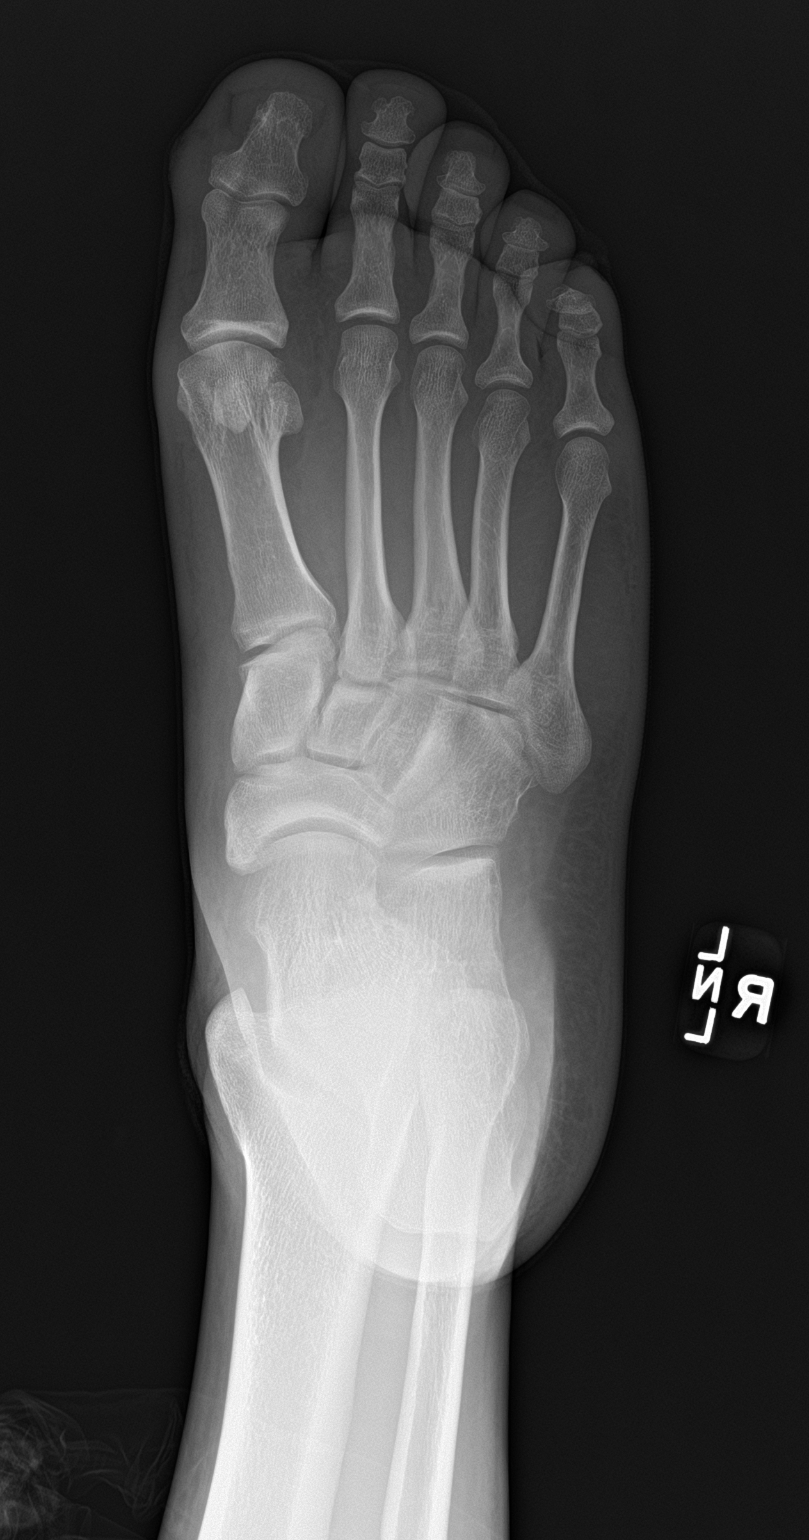

[foot obl]
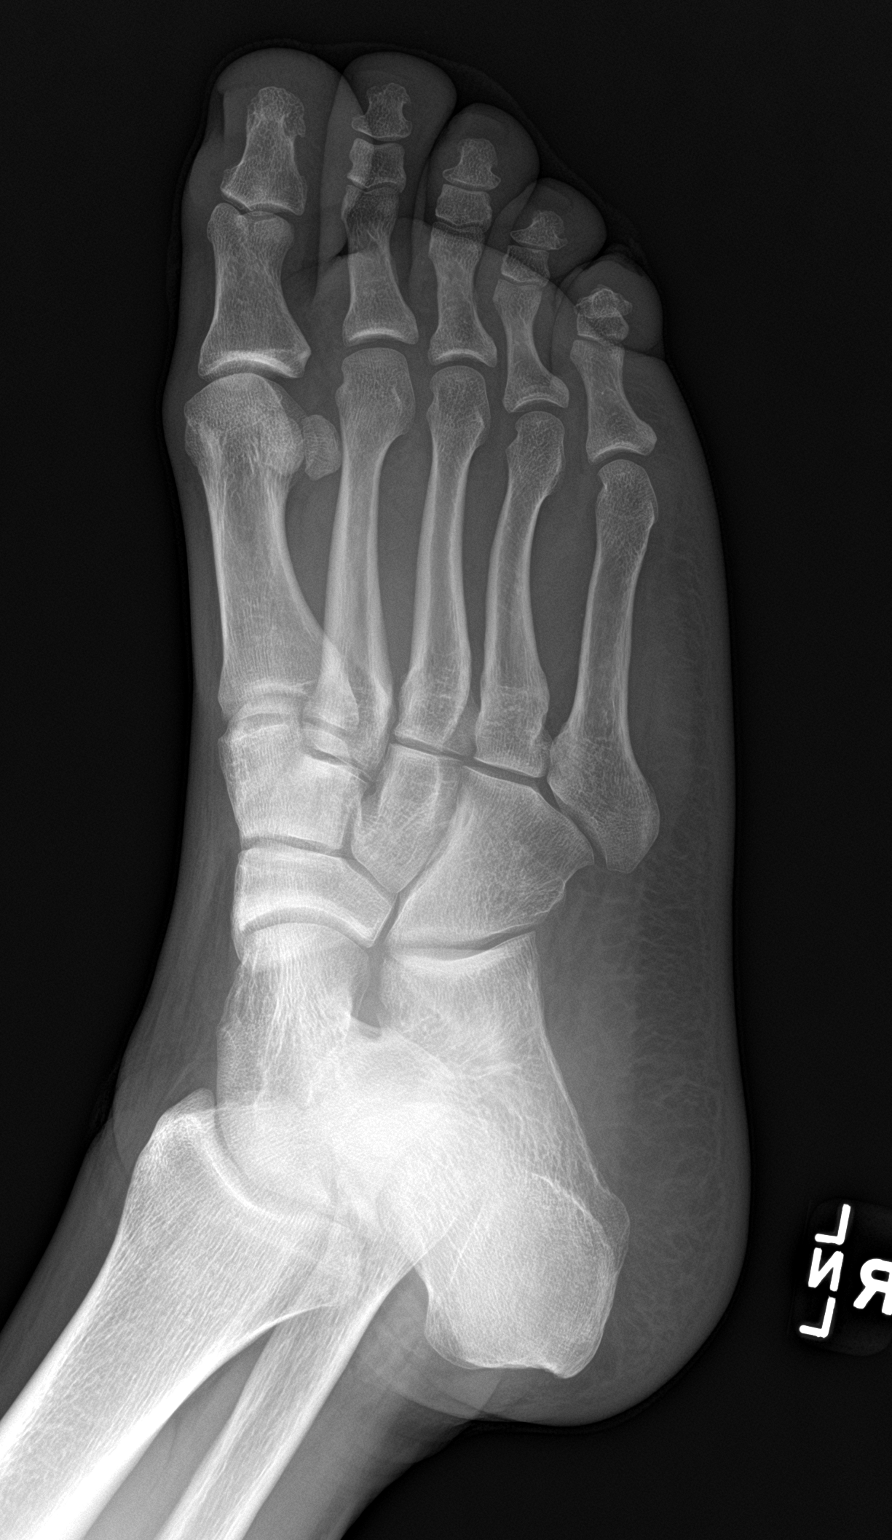

[foot lat]
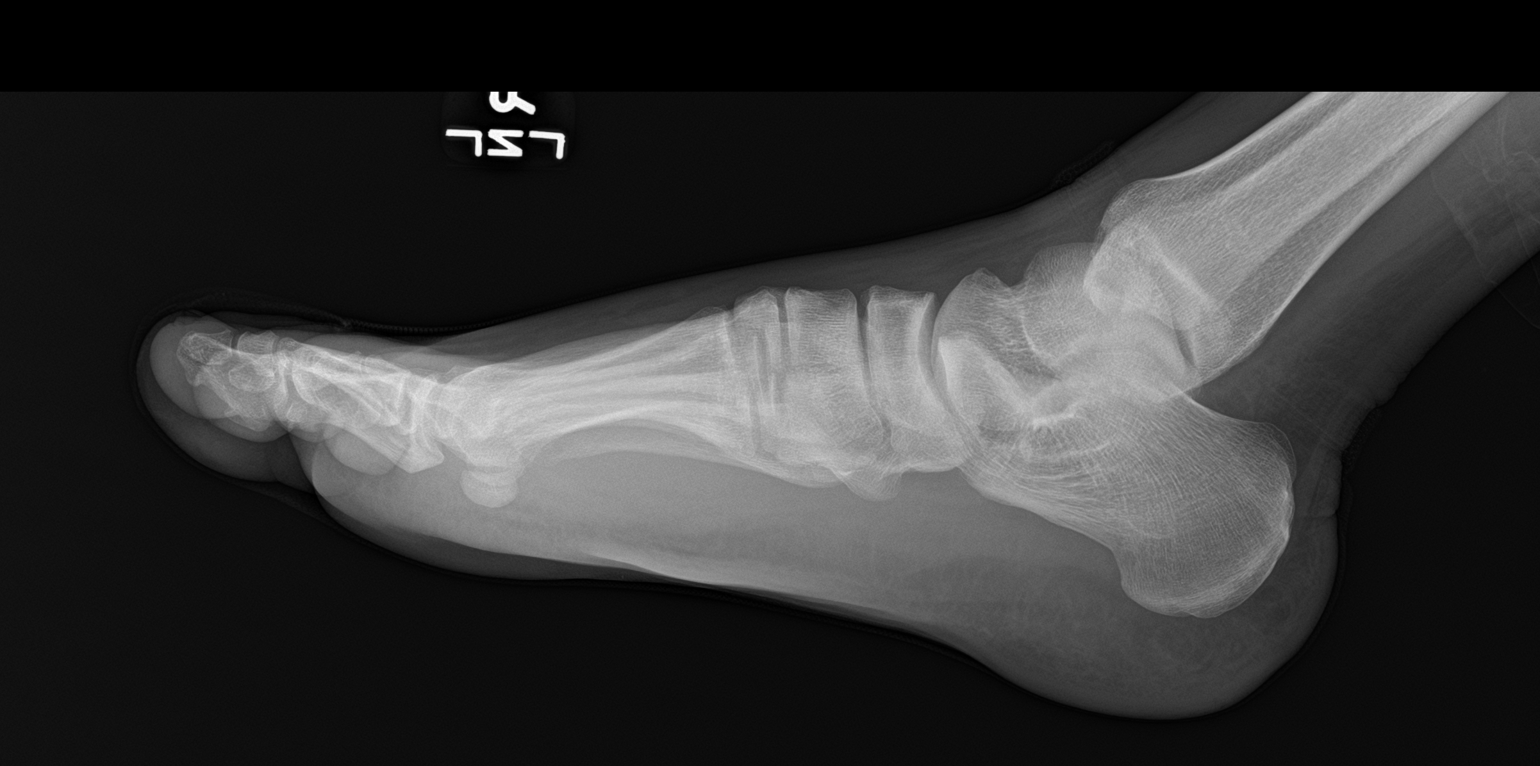

[3 of 3 positions shown; findings below may reference images not displayed]

FINDINGS: There is no evidence of fracture or dislocation. There is no
evidence of arthropathy or other focal bone abnormality. Mild dorsal
soft tissue swelling.
IMPRESSION: No acute osseous abnormality.

## 2020-03-18 IMAGING — CR DG KNEE 1-2V*L*
2 series · 2 of 2 positions shown · non-contrast
Comparison: None.

CLINICAL DATA: Status post fall, knee pain

EXAM:
LEFT KNEE - 1-2 VIEW

[x knee ap left (1 of 2)]
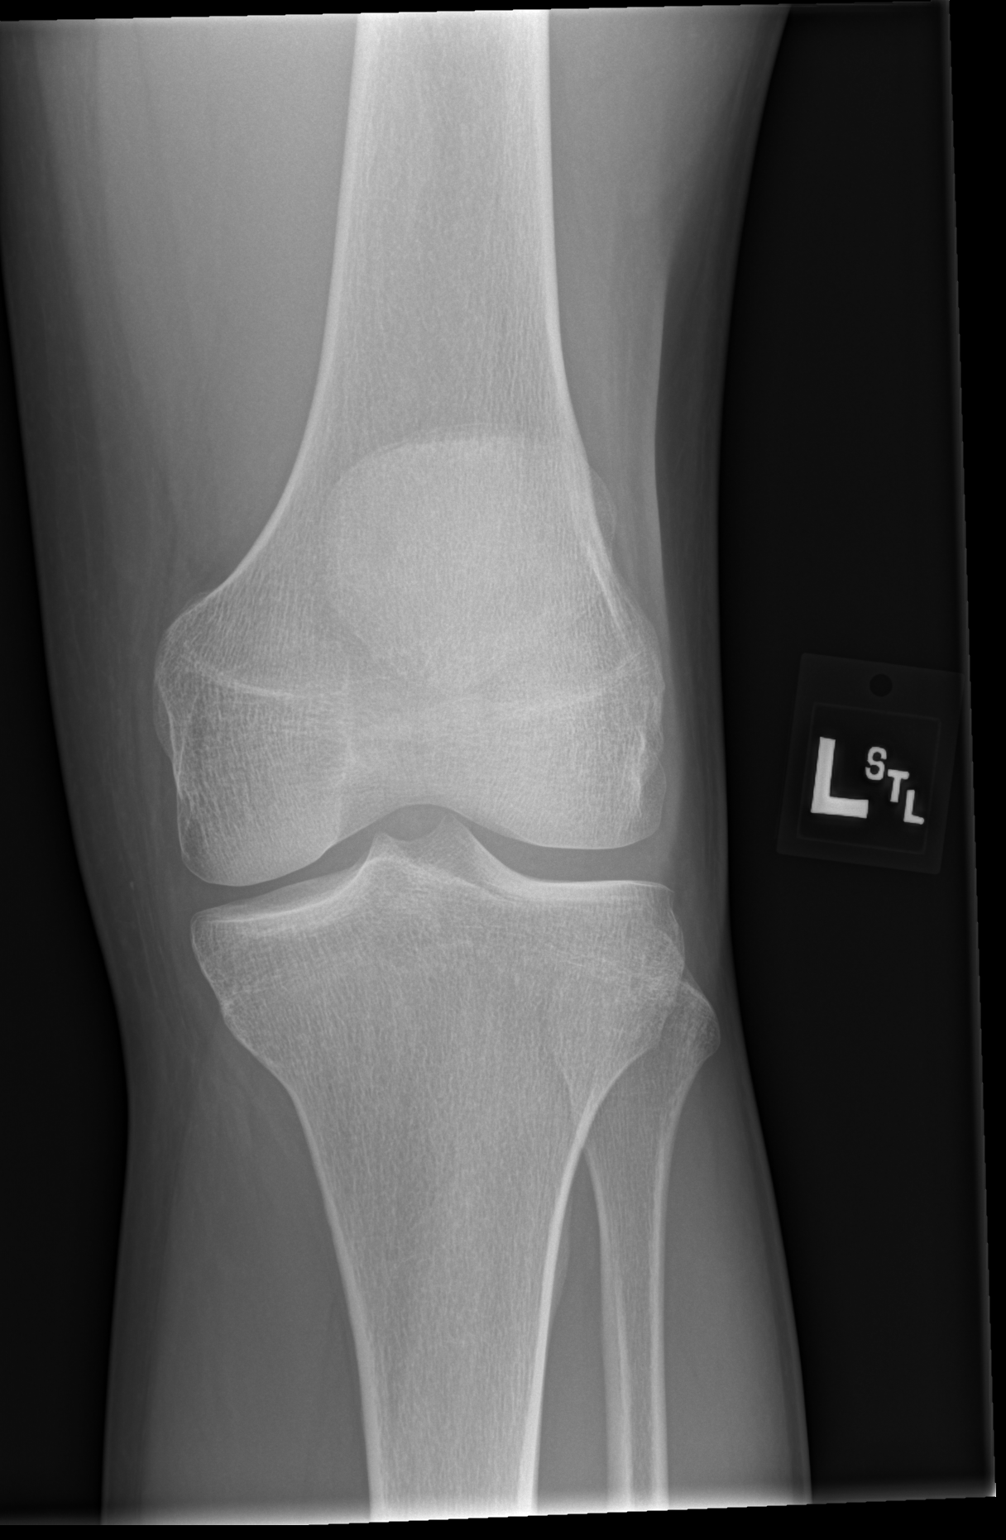

[x knee ap left (2 of 2)]
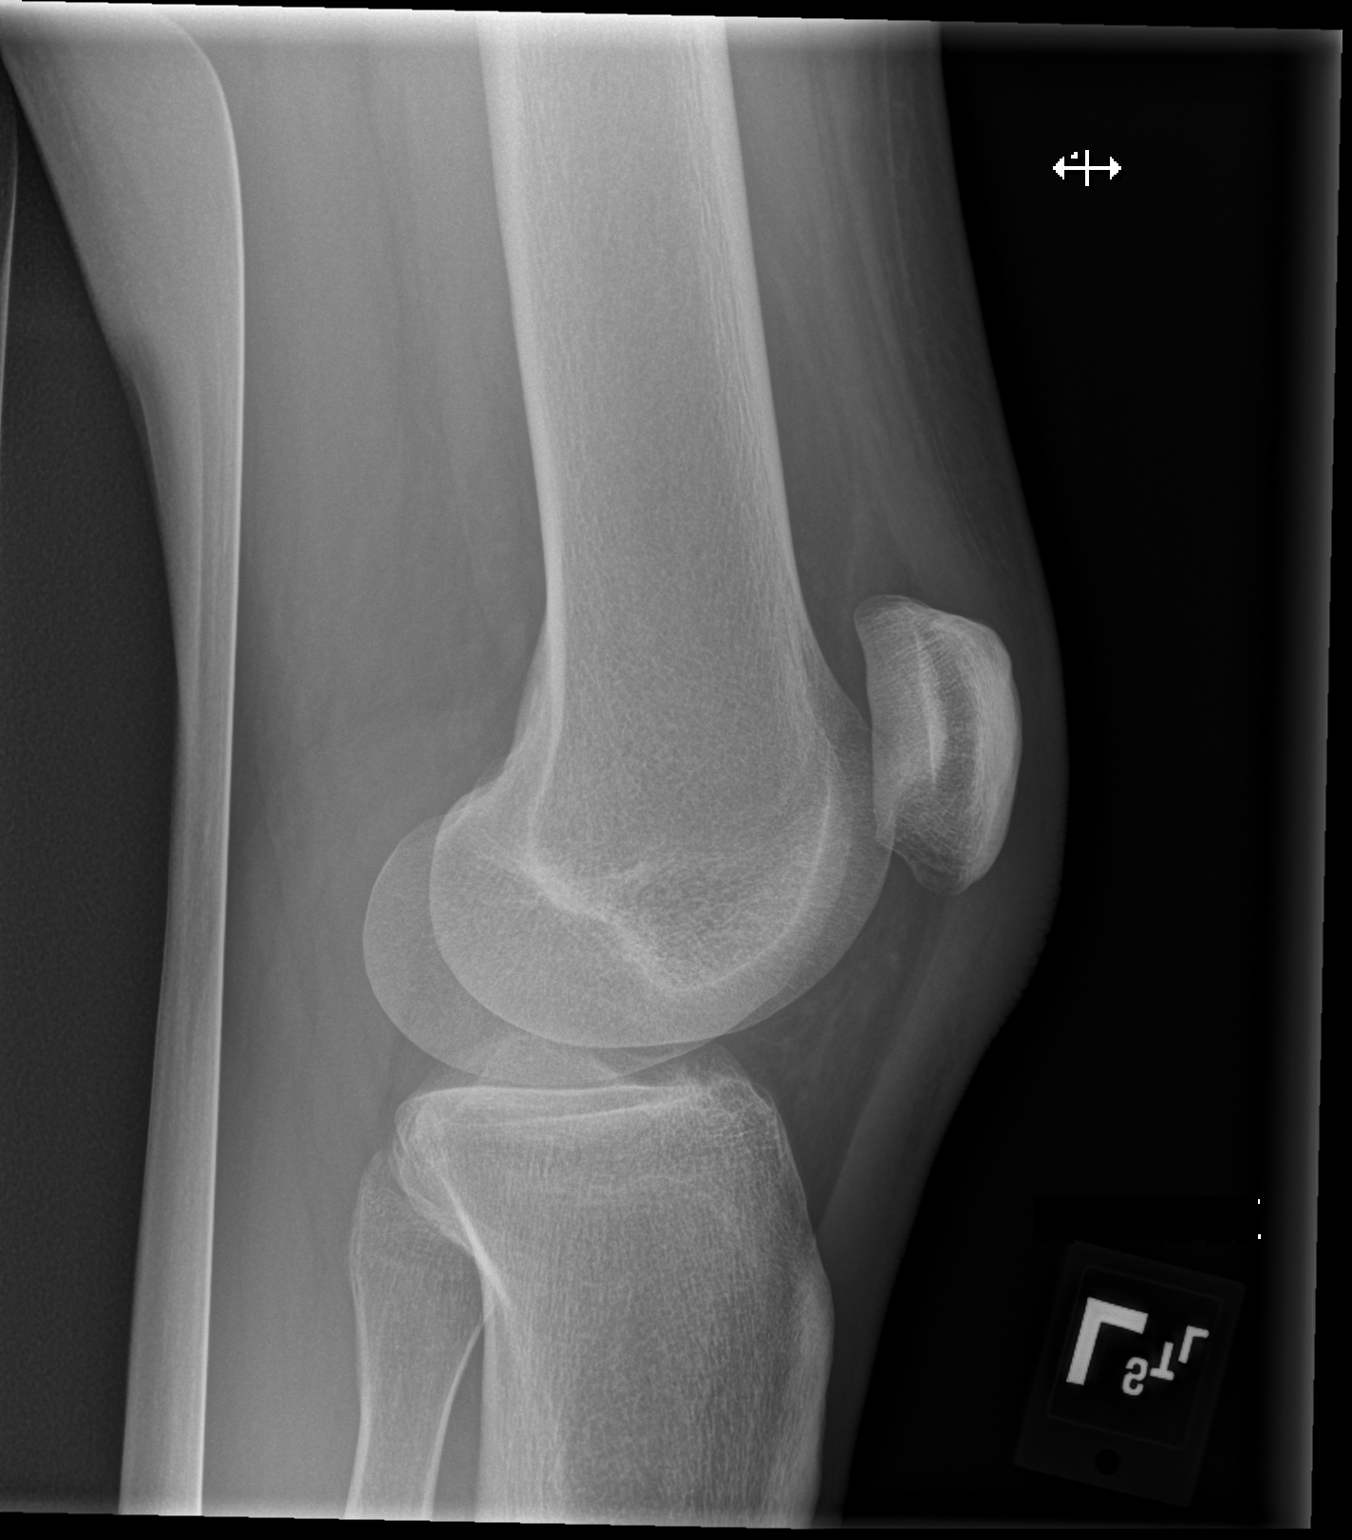

[2 of 2 positions shown; findings below may reference images not displayed]

FINDINGS: No evidence of fracture, dislocation, or joint effusion. No evidence
of arthropathy or other focal bone abnormality. Soft tissues are
unremarkable.
IMPRESSION: Negative.

## 2020-04-03 IMAGING — DX DG CHEST 2V
2 series · 2 of 2 positions shown · non-contrast
Comparison: 04/10/2019

CLINICAL DATA: Right-sided rib pain for several days

EXAM:
CHEST - 2 VIEW

[chest pa]
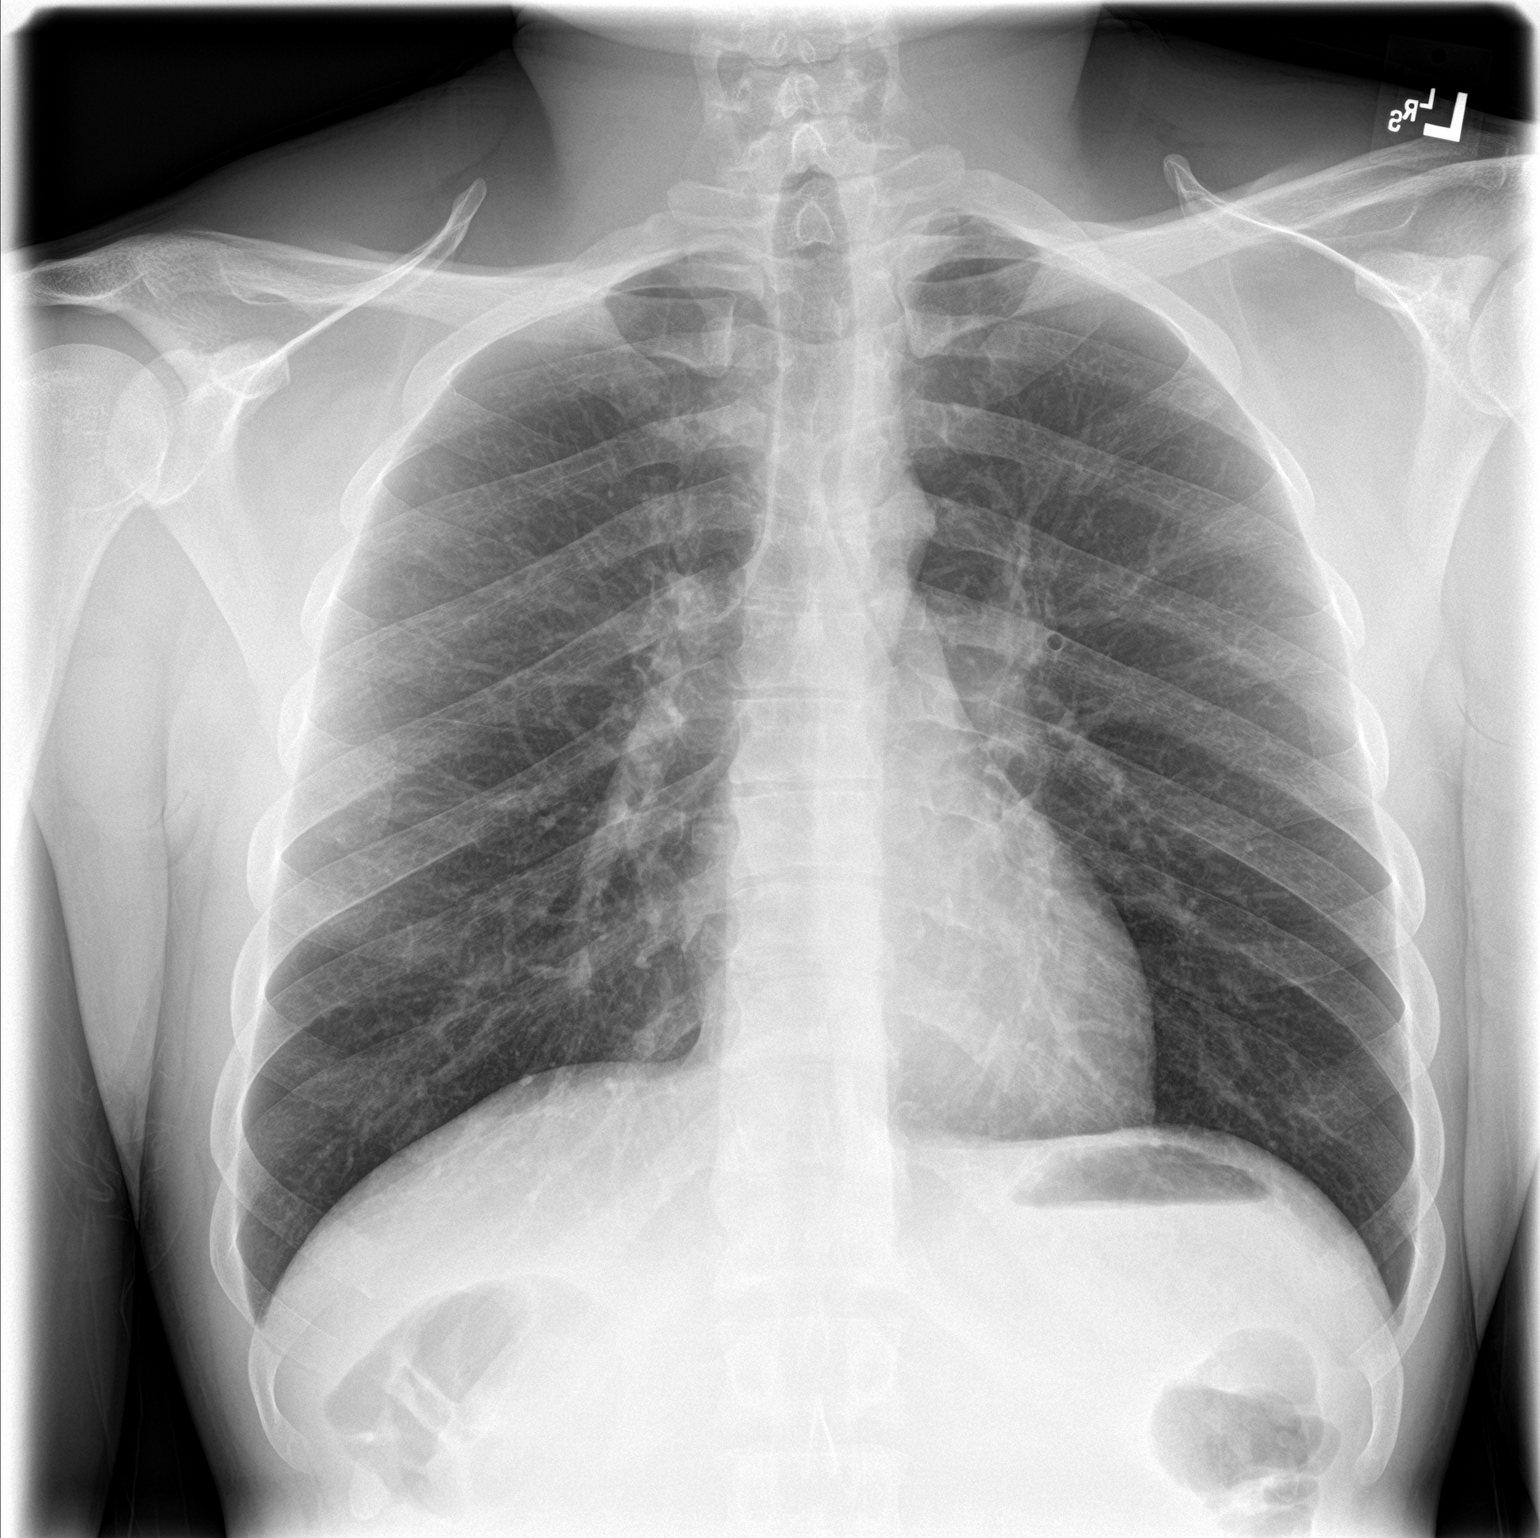

[chest lat]
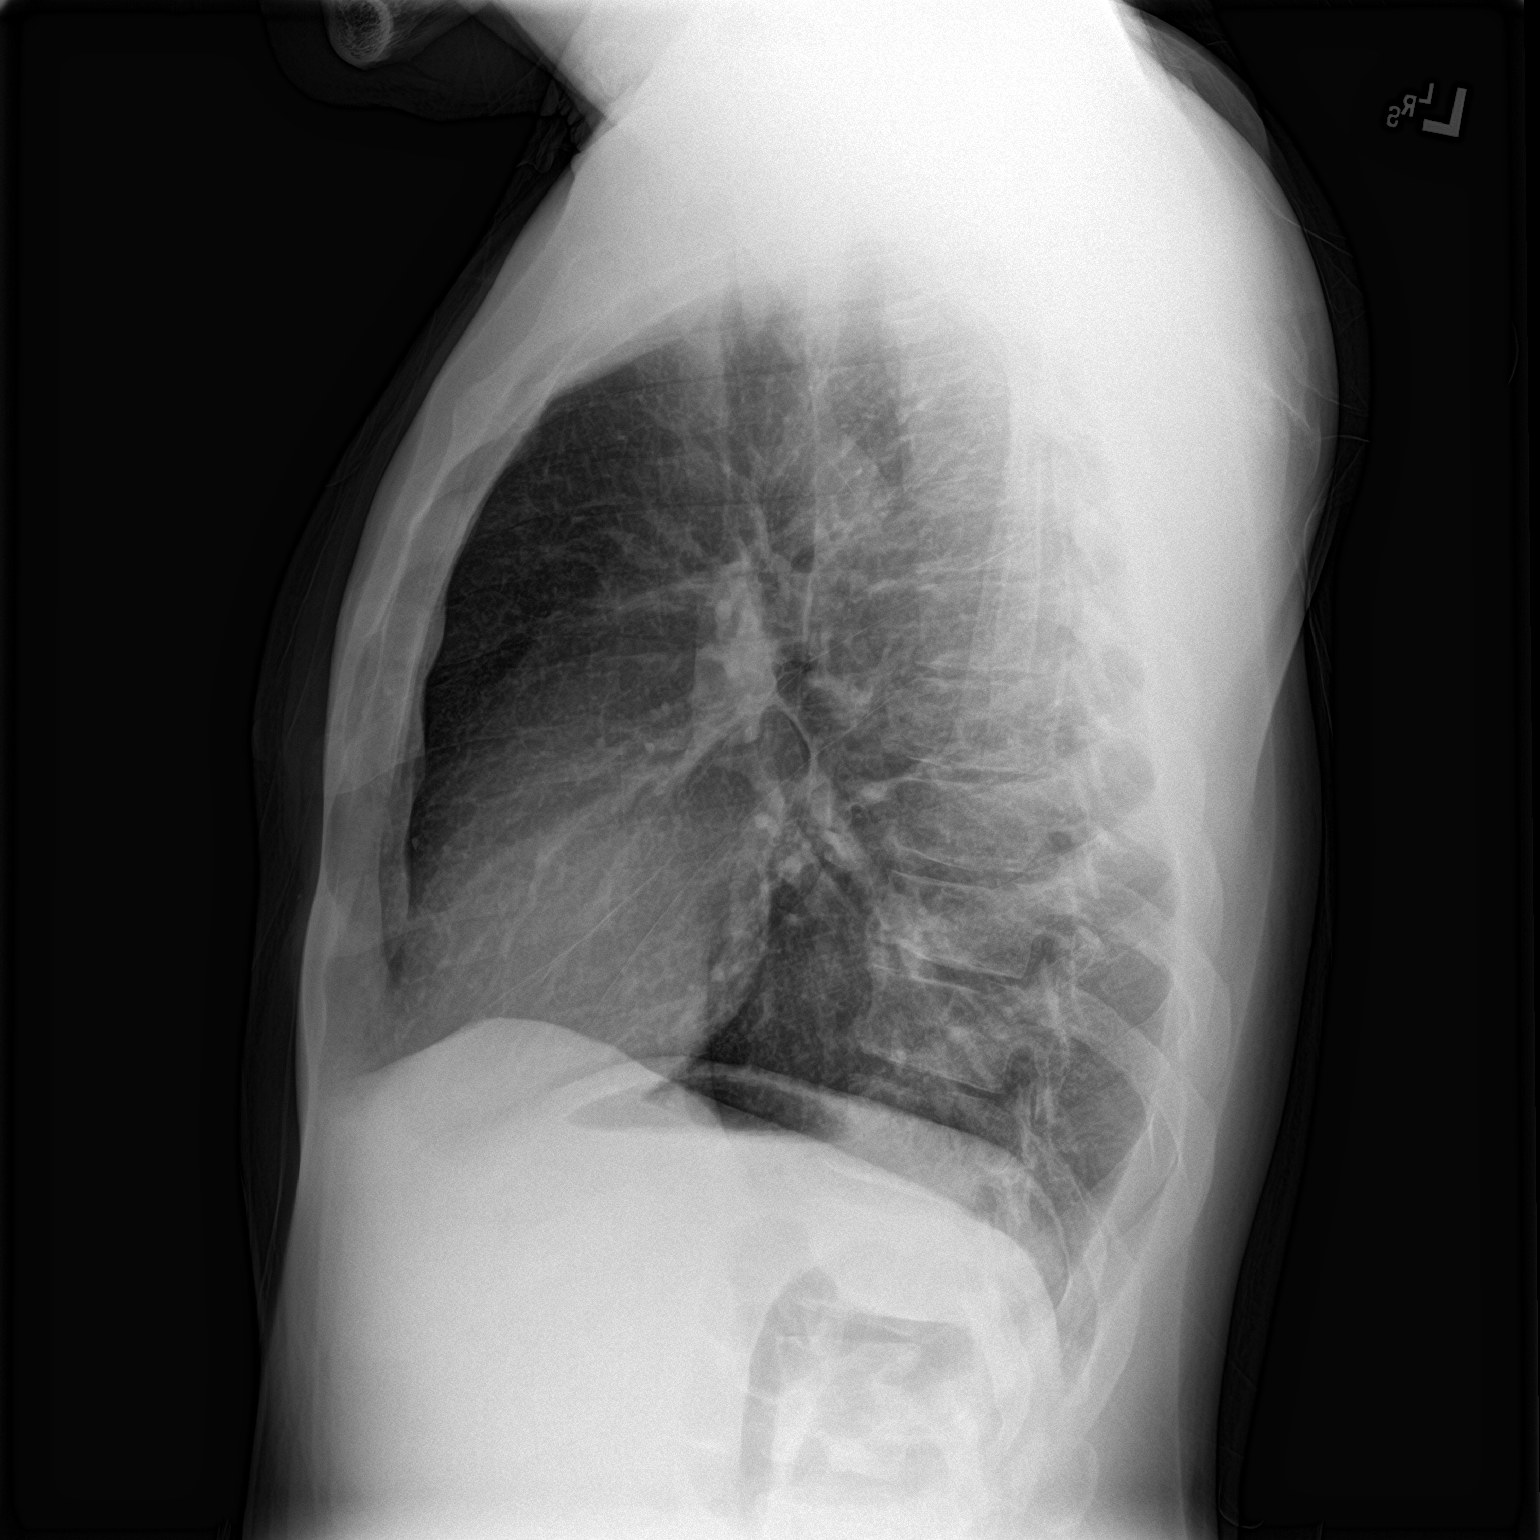

[2 of 2 positions shown; findings below may reference images not displayed]

FINDINGS: The heart size and mediastinal contours are within normal limits.
Both lungs are clear. The visualized skeletal structures are
unremarkable.
IMPRESSION: No active cardiopulmonary disease.

## 2021-10-23 DEATH — deceased
